# Patient Record
Sex: Female | Born: 1935 | Race: White | Hispanic: No | Smoking: Never smoker
Health system: Southern US, Community
[De-identification: ages and names within clinical notes are randomized; demographics above are authoritative.]

## PROBLEM LIST (undated history)

## (undated) DIAGNOSIS — I350 Nonrheumatic aortic (valve) stenosis: Secondary | ICD-10-CM

## (undated) DIAGNOSIS — N183 Chronic kidney disease, stage 3 unspecified: Secondary | ICD-10-CM

## (undated) DIAGNOSIS — E785 Hyperlipidemia, unspecified: Secondary | ICD-10-CM

## (undated) DIAGNOSIS — I10 Essential (primary) hypertension: Secondary | ICD-10-CM

## (undated) DIAGNOSIS — E039 Hypothyroidism, unspecified: Secondary | ICD-10-CM

## (undated) DIAGNOSIS — Z789 Other specified health status: Secondary | ICD-10-CM

## (undated) DIAGNOSIS — M1990 Unspecified osteoarthritis, unspecified site: Secondary | ICD-10-CM

## (undated) DIAGNOSIS — M810 Age-related osteoporosis without current pathological fracture: Secondary | ICD-10-CM

## (undated) DIAGNOSIS — C801 Malignant (primary) neoplasm, unspecified: Secondary | ICD-10-CM

## (undated) DIAGNOSIS — K219 Gastro-esophageal reflux disease without esophagitis: Secondary | ICD-10-CM

## (undated) DIAGNOSIS — R001 Bradycardia, unspecified: Secondary | ICD-10-CM

## (undated) DIAGNOSIS — D649 Anemia, unspecified: Secondary | ICD-10-CM

## (undated) DIAGNOSIS — I351 Nonrheumatic aortic (valve) insufficiency: Secondary | ICD-10-CM

## (undated) DIAGNOSIS — N189 Chronic kidney disease, unspecified: Secondary | ICD-10-CM

## (undated) DIAGNOSIS — D509 Iron deficiency anemia, unspecified: Secondary | ICD-10-CM

## (undated) DIAGNOSIS — I2510 Atherosclerotic heart disease of native coronary artery without angina pectoris: Secondary | ICD-10-CM

## (undated) HISTORY — PX: CARDIAC CATHETERIZATION: SHX172

## (undated) HISTORY — DX: Bradycardia, unspecified: R00.1

## (undated) HISTORY — PX: ABDOMINAL HYSTERECTOMY: SHX81

## (undated) HISTORY — DX: Nonrheumatic aortic (valve) insufficiency: I35.1

## (undated) HISTORY — DX: Other specified health status: Z78.9

## (undated) HISTORY — DX: Nonrheumatic aortic (valve) stenosis: I35.0

## (undated) HISTORY — DX: Gastro-esophageal reflux disease without esophagitis: K21.9

## (undated) HISTORY — DX: Iron deficiency anemia, unspecified: D50.9

## (undated) HISTORY — PX: OTHER SURGICAL HISTORY: SHX169

## (undated) HISTORY — DX: Chronic kidney disease, stage 3 unspecified: N18.30

## (undated) HISTORY — DX: Age-related osteoporosis without current pathological fracture: M81.0

## (undated) HISTORY — DX: Hyperlipidemia, unspecified: E78.5

## (undated) HISTORY — DX: Chronic kidney disease, stage 3 (moderate): N18.3

---

## 2002-01-17 HISTORY — PX: CORONARY ARTERY BYPASS GRAFT: SHX141

## 2002-09-16 ENCOUNTER — Inpatient Hospital Stay (HOSPITAL_COMMUNITY): Admission: RE | Admit: 2002-09-16 | Discharge: 2002-09-23 | Payer: Self-pay | Admitting: Cardiology

## 2002-09-17 ENCOUNTER — Encounter (INDEPENDENT_AMBULATORY_CARE_PROVIDER_SITE_OTHER): Payer: Self-pay | Admitting: Cardiology

## 2002-09-19 ENCOUNTER — Encounter: Payer: Self-pay | Admitting: Surgery

## 2002-09-20 ENCOUNTER — Encounter: Payer: Self-pay | Admitting: Surgery

## 2002-09-21 ENCOUNTER — Encounter: Payer: Self-pay | Admitting: Surgery

## 2002-11-04 ENCOUNTER — Encounter (HOSPITAL_COMMUNITY): Admission: RE | Admit: 2002-11-04 | Discharge: 2003-02-02 | Payer: Self-pay | Admitting: Cardiology

## 2003-02-03 ENCOUNTER — Encounter (HOSPITAL_COMMUNITY): Admission: RE | Admit: 2003-02-03 | Discharge: 2003-02-17 | Payer: Self-pay | Admitting: Cardiology

## 2005-04-14 ENCOUNTER — Other Ambulatory Visit: Admission: RE | Admit: 2005-04-14 | Discharge: 2005-04-14 | Payer: Self-pay | Admitting: Internal Medicine

## 2005-10-15 ENCOUNTER — Encounter: Admission: RE | Admit: 2005-10-15 | Discharge: 2005-10-15 | Payer: Self-pay | Admitting: Internal Medicine

## 2005-11-18 ENCOUNTER — Encounter: Admission: RE | Admit: 2005-11-18 | Discharge: 2005-12-13 | Payer: Self-pay | Admitting: Neurosurgery

## 2006-08-28 ENCOUNTER — Other Ambulatory Visit: Admission: RE | Admit: 2006-08-28 | Discharge: 2006-08-28 | Payer: Self-pay | Admitting: Geriatric Medicine

## 2010-06-04 NOTE — Discharge Summary (Signed)
[name], [name]                        ACCOUNT NO.:  [id number]   MEDICAL RECORD NO.:  [id number]                   PATIENT TYPE:  INP   LOCATION:  2012                                 FACILITY:  [facility]   PHYSICIAN:  Evelene Croon, M.D.                  DATE OF BIRTH:  05-Dec-1931   DATE OF ADMISSION:  09/16/2002  DATE OF DISCHARGE:  09/23/2002                                 DISCHARGE SUMMARY   ADMISSION DIAGNOSIS:  Abnormal Cardiolite stress test and chest pain.   PAST MEDICAL HISTORY:  1. Hypothyroidism.  2. Asthma.  3. Hypolipidemia.  4. Osteoarthritis.   ALLERGIES:  ANACIN, EXCEDRIN, and ALEVE.  They all cause hives.   DISCHARGE DIAGNOSIS:  Severe three-vessel coronary artery disease, status  post coronary artery bypass graft.   BRIEF HISTORY:  [name] is a 77 year old Caucasian female.  She  presented to her primary care [facility], Dr. Merril Abbe with a two-month  history of exertional burning sensation at the base of her neck.  He  performed a Cardiolite stress test and then referred her to Armanda Magic,  M.D., for interpretation of the exam.  She saw Dr. Mayford Knife on September 13, 2002.  Dr. Mayford Knife interpreted the Cardiolite exam as revealing anterior  ischemia with a fixed apical defect, question infarct versus breast  attenuation.  The LV function was normal.  After examination of [name]  and review of the Cardiolite, Dr. Mayford Knife recommended proceeding with cardiac  catheterization.  [name] agreed with this plan and she was scheduled  for September 16, 2002.   HOSPITAL COURSE:  On September 16, 2002, [name] was electively admitted  to [facility] under the care of Armanda Magic, M.D.  She underwent a  cardiac catheterization.  This revealed severe three-vessel coronary artery  disease.  The ejection fraction was estimated to be 55-65%.  As her lesions  were not amenable to PCI, a cardiac surgery consultation was requested.  She  was seen later  that day by Evelene Croon, M.D.  After examination of the  patient and review of the available records, including the catheterization  films, Dr. Laneta Simmers agreed that proceeding with coronary artery bypass surgery  was the preferred treatment choice of this woman.  The procedure, risks, and  benefits were all discussed with [name] and she agreed to proceed with  surgery.   Preoperative arterial evaluation included carotid Dopplers which revealed no  significant carotid artery disease.  Her upper extremity exam included an  Allen's test.  This was abnormal on the right and within normal limits on  the left.  Her lower extremity exam revealed palpable pedal pulses  bilaterally.   [name] remained stable in the hospital awaiting her surgery and on  September 19, 2002, she underwent the following surgical procedure by Evelene Croon, M.D.:  Coronary artery bypass grafting x 4.  Grafts  placed at the  time of the procedure:  Left internal mammary artery grafted to the left  anterior descending artery, saphenous vein graft grafted to the diagonal  artery, saphenous vein grafted to the obtuse marginal artery, saphenous vein  grafted to the posterior descending artery.  Vein was harvested from the  left thigh via the endovein harvesting technique.  She tolerated this  procedure well and was transferred in stable condition to the SICU.  She  remained hemodynamically stable and was extubated by the early morning hours  of postoperative day #1.  She awoke from anesthesia neurologically intact.  On postoperative day #1, her hemoglobin was 8.1.  She was transfused with  one unit of packed cells.  She required only routine care in the intensive  care unit and was transferred to unit 2000 on postoperative day #1.   [name] is making very good progress in recovery from her surgery.  This morning, September 22, 2002, is postoperative day #3.  She reports  feeling pretty well.  Her vital signs  are stable.  The blood pressure is  130/65.  She is afebrile.  Her room air saturation is 95%.  Her heart is in  normal sinus rhythm, rate in the 80s.  Her lungs are clear.  Her incisions  are healing well.  She does have mild lower extremity edema.  If she  continues to progress in this manner, it is expected that she will be ready  for discharge home in the next 24-48 hours.   LABORATORY STUDIES:  On September 21, 2002, CBC with white blood count 10.6,  hemoglobin 8.5, hematocrit 25.4, and platelets 124.  Chemistries include a  sodium of 137, potassium 3.8, chloride 107, CO2 27, BUN 17, creatinine 1.0,  and glucose 98.   CONDITION ON DISCHARGE:  Improved.   DISCHARGE MEDICATIONS:  1. Enteric-coated aspirin 325 mg daily.  2. Coreg 6.25 mg b.i.d.  3. Lasix 40 mg p.o. daily for five days.  4. Potassium chloride 20 mEq p.o. daily x 5 days.   She has also been instructed to resume her home medications of:  1. Synthroid 75 mcg daily.  2. Protonix 40 mg daily.  3. Lipitor 10 mg daily.  4. Periostat 20 mg daily.   For pain management, she may have Ultram 50 mg one to two p.o. q.4-6h.  p.r.n.   ACTIVITY:  She has been asked to refrain from any driving, heavy lifting,  pushing, or pulling.  She is also instructed to continue her breathing  exercises and daily walking.   DIET:  Her diet should continue to be a heart healthy diet.   WOUND CARE:  She is to shower daily with mild soap and water.  If her  incisions show any signs of infection or should she have a fever greater  than 101 degrees Fahrenheit, she is to call Dr. Sharee Pimple office.   FOLLOWUP:  1. Dr. Mayford Knife should see her in her office in approximately two weeks.  The     patient has been asked to call to arrange an appointment and has been     given the office phone number.  She will have a chest x-ray taken that     day.  2. Dr. Laneta Simmers would like to see her at the [facility] office in three weeks.  The    office will call with a  date and time for that appointment.  She will be     asked to bring  her chest x-ray for Dr. Sharee Pimple review.      Toribio Harbour, N.P.                  Evelene Croon, M.D.    CTK/MEDQ  D:  09/22/2002  T:  09/22/2002  Job:  [id number]   cc:   Armanda Magic, M.D.  [address]  [place], [place] [address]  Fax: [phone]   Ike Bene, M.D.  [address]  [place]  [place] [address]  Fax: [phone]

## 2010-06-04 NOTE — Op Note (Signed)
[name], [name]                        ACCOUNT NO.:  [id number]   MEDICAL RECORD NO.:  [id number]                   PATIENT TYPE:  INP   LOCATION:  2316                                 FACILITY:  [facility]   PHYSICIAN:  Evelene Croon, M.D.                  DATE OF BIRTH:  10-29-1931   DATE OF PROCEDURE:  09/19/2002  DATE OF DISCHARGE:                                 OPERATIVE REPORT   PREOPERATIVE DIAGNOSES:  Severe three-vessel coronary artery disease.   POSTOPERATIVE DIAGNOSES:  Severe three-vessel coronary artery disease.   OPERATION PERFORMED:  Median sternotomy, extracorporeal circulation,  coronary artery bypass grafting surgery times four using a left internal  mammary artery graft to the left anterior descending coronary artery, with  saphenous vein graft to the diagonal branch of the left anterior descending,  a saphenous vein graft to the obtuse marginal branch of the left circumflex  coronary artery, and saphenous vein graft to the posterior descending  coronary artery.  Endoscopic vein harvesting from the left leg.   SURGEON:  Alleen Borne, M.D.   ASSISTANT:  Kerin Perna, M.D.   SECOND ASSISTANT:  1. Rowe Clack, P.A.-C.  2. Pecola Leisure, PA   ANESTHESIA:  General endotracheal.   INDICATIONS FOR PROCEDURE:  The patient is a 77 year old woman with a  history of hypothyroidism and hyperlipidemia who presented with a two-month  history of substernal chest burning.  She had a positive Cardiolite stress  test showing anterior ischemia with a fixed apical defect.  Cardiac  catheterization was performed and this showed severe three vessel disease.  The LAD was occluded at its midportion with faint bridging collaterals  filling the distal vessel.  There was a single diagonal branch which  bifurcated quickly after its take off from the LAD.  The more superior  subbranch was a larger vessel and had 99% proximal stenosis.  The inferior  subbranch was  subtotally occluded but was very small.  The left circumflex  had 80% and 70% proximal and midstenoses and essentially gave off a single  large marginal branch.  The right coronary artery had sequential 90%  proximal and mid posterior descending stenoses.  Left ventricular function  was normal. There was no gradient across the aortic valve.  There was mild  mitral regurgitation on catheterization.  An echocardiogram was performed  and this showed normal left ventricular ejection fraction of 55 to 65% with  no wall motion abnormalities and trivial mitral regurgitation.  After review  of the angiogram and examination of the patient, it was felt that coronary  artery bypass graft surgery was the best treatment.  I discussed the  operative procedure with the patient and her family including alternatives,  benefits, and risks including bleeding, possible blood transfusion,  infection, stroke, myocardial infarction, graft failure, and death.  They  understood and agreed to proceed.   DESCRIPTION OF PROCEDURE:  The patient was taken to the operating room and  placed on the table in supine position.  After induction of general  endotracheal anesthesia, a Foley catheter was placed in the bladder using  sterile technique.  Then the chest, abdomen and both lower extremities were  prepped and draped in the usual sterile manner.  The chest was entered  through a median sternotomy incision and the pericardium opened in the  midline.  Examination of the heart showed good ventricular contractility.  The ascending aorta had no palpable plaques in it.   Then the left internal mammary artery was harvested from the chest wall as a  pedicle graft.  This was a medium caliber vessel with excellent blood flow  through it.  At the same time a segment of greater saphenous vein was  harvested from the left leg using endoscopic vein harvest technique.  This  vein was of medium size and good quality.  We initially  tried to find a vein  in the right leg adjacent to the right knee but could not find the saphenous  vein.  She had a large amount of fat in this area which made this difficult.   Then the patient was heparinized and when an adequate activated clotting  time was achieved, the distal ascending aorta was cannulated using a 20  French aortic cannula for arterial inflow.  Venous outflow was achieved  using a two-stage venous cannula for the right atrial appendage.  An  antegrade cardioplegia and vent cannula was inserted in the aortic root.   The patient was placed on cardiopulmonary bypass and the distal coronary  arteries identified.  The LAD was diffusely diseased.  It was graftable  distally.  The diagonal branch was small but graftable.  It was lying  beneath the epicardial fat.  The obtuse marginal was a large vessel but was  also diffusely diseased extending out into the distal portion of the vessel.  The right coronary artery gave off a long but small posterior descending  branch which was diffusely diseased but graftable distally.  There were a  couple of small posterolateral branches that were not graftable.  The main  body of the artery was completely involved with calcified plaque.   Then the aorta was crossclamped and of cold blood antegrade  cardioplegia was administered in the aortic root with quick arrest of the  heart.  Systemic hypothermia to 20 degrees centigrade and topical  hypothermia with iced saline was used.  A temperature probe was placed on  the septum and insulating pad in the pericardium.   The first distal anastomosis was performed to the obtuse marginal branch.  The internal diameter was 1.75 mm.  The conduit used was a segment of the  greater saphenous vein.  The anastomosis was performed in an end-to-side  manner using continuous 7-0 Prolene suture.  Flow was measured through the  graft and was excellent.  The second distal anastomosis was performed  to the diagonal branch.  The  internal diameter of this vessel was 1.5 mm.  The conduit used was a second  segment of the greater saphenous vein.  The anastomosis was performed in an  end-to-side manner using continuous 7-0 Prolene suture.  Flow was measured  through the graft and was good.  Then another dose of cardioplegia was given  down the vein grafts and in the aortic root.   Then the third distal anastomosis was performed to the posterior descending  coronary artery.  This was grafted quite distally due to the mid vessel  stenoses.  The internal diameter distally was about 1.5 mm.  The conduit  used was a third segment of the greater saphenous vein and the anastomosis  performed in an end-to-side manner using continuous 7-0 Prolene suture.  Flow was measured through the graft and was excellent.   The fourth distal anastomosis was performed to the distal portion of the  left anterior descending coronary artery.  The internal diameter was 1.75  mm.  The conduit used was the left internal mammary artery graft and this  was brought through the opening in the left pericardium anterior to the  phrenic nerve.  It was anastomosed to the LAD in end-to-side manner using  continuous 8-0 Prolene suture.  The pedicle was tacked to the epicardium  with 6-0 Prolene sutures.   The patient was rewarmed to 37 degrees centigrade and the clamp removed from  the mammary pedicle.  There was rapid warming of the ventricular septum and  return of spontaneous ventricular fibrillation.  The crossclamp was removed  with time of 60 minutes and the patient defibrillated into sinus rhythm.   A partial occlusion clamp was placed on the aortic root and three proximal  vein graft anastomoses were performed in end-to-side manner using continuous  6-0 Prolene suture.  The clamp was removed, the vein grafts deaired and the  clamps removed from them.  The proximal and distal anastomoses appeared  hemostatic and the  line of the graft satisfactory.  Graft markers were  placed around the proximal anastomoses.  Two temporary right ventricular and  right atrial pacing wires placed and brought out through the skin.   When the patient had rewarmed to 37 degrees centigrade, she was weaned from  cardiopulmonary bypass on no inotropic agents.  Total bypass time was 101  minutes.  Cardiac function appeared excellent with a cardiac output of 4L  per minute.  Protamine was given and the venous and aortic cannulas were  removed without difficulty.  Hemostasis was achieved.  Three chest tubes  were placed with a tube in the posterior pericardium, one in the left  pleural space and one in the anterior mediastinum.  The pericardium was  easily approximated over the heart.  The sternum was closed with #6  stainless steel wires.  The fascia was closed with continuous #1 Vicryl  suture.  The subcutaneous tissue was closed with continuous 2-0 Vicryl and the skin with 3-0 Vicryl subcuticular closure.  The lower extremity vein  harvest site was closed in layers in a similar manner.  Sponge, needle and  instrument counts were correct according to the scrub nurse.  Dry sterile  dressings were applied over the incisions, around the chest tubes which were  hooked to Pleur-evac suction.  The patient remained hemodynamically stable  and was transported to the SICU in guarded but stable condition.                                                Evelene Croon, M.D.    BB/MEDQ  D:  09/19/2002  T:  09/20/2002  Job:  [id number]   cc:   Armanda Magic, M.D.  [address]  [place], [place] [address]  Fax: [phone]   Cath lab

## 2010-06-04 NOTE — Consult Note (Signed)
[name], [name]                        ACCOUNT NO.:  [id number]   MEDICAL RECORD NO.:  [id number]                   PATIENT TYPE:  OIB   LOCATION:  2027                                 FACILITY:  [facility]   PHYSICIAN:  Evelene Croon, M.D.                  DATE OF BIRTH:  23-Mar-1931   DATE OF CONSULTATION:  09/16/2002  DATE OF DISCHARGE:                                   CONSULTATION   REASON FOR CONSULTATION:  Severe 3-vessel coronary artery disease.   HISTORY OF PRESENT ILLNESS:  This patient is a 77 year old white female who  is a patient of  Ike Bene, M.D.  She presented with a two month  history of exertional upper substernal burning usually occurring with  exertion such as walking up stairs or up an incline.  She has not had these  symptoms at rest.  She does have occasional burning in the same area after  eating which she has had for a long time and felt that this was reflux and  heartburn.  Recently, her usual antacid therapy of Pepcid has been  refractory and she was placed on Protonix 40 mg daily which seemed to help  some.  Recently, she has been having some episodes of indigestion that have  occurred, unrelated to eating.  She recently underwent a stress Cardiolite  exam on September 03, 2002, which showed anterior ischemia with a fixed apical  defect.  This is felt to be possible infarct versus breast attenuation.  She  did have burning sensation in the chest during exercise.  Left ventricular  function was normal.  She underwent cardiac catheterization today which  showed severe 3-vessel disease.  The LAD was occluded in it's midportion  with faint filling of the distal vessel by collaterals.  There was a  diagonal branch with 99% stenosis proximally an area of bifurcation.  One of  the subbranches had subtotal occlusion.  The left circumflex had a single  large marginal branch.  This had 80% and 70% sequential stenoses.  The right  coronary artery had  posterior descending branch and had two sequential 90%  stenoses in the proximal and midportions.  Left ventricular function was  normal.  There was no gradient across the aortic valve.  There is mild  mitral regurgitation.  End diastolic pressure was 34.   REVIEW OF SYSTEMS:  GENERAL:  Denies any fever or chills.  She has had no  recent weight changes.  She has had some fatigue.  Eyes:  Negative.  ENT:  Negative.  ENDOCRINE:  She has hypothyroidism.  She denies diabetes.  CARDIOVASCULAR:  As above.  She has had some mild shortness of breath with  exertion.  She denies PND and orthopnea.  She has had no peripheral edema.  She denies palpitations.  RESPIRATORY:  Denies cough and sputum production.  GI:  No nausea or vomiting.  Denies melena  and bright red blood per rectum.  GU:  Denies dysuria and hematuria.  Neurological:  Denies any focal weakness  or numbness.  She denies dizziness and syncope.  She has never had a TIA or  a stroke.  MUSCULOSKELETAL:  She denies myalgias.  She does have  osteoarthritis.  PSYCHIATRIC:  Negative.   ALLERGIES:  ALEVE, ANACIN, EXCEDRIN WHICH CAUSE HIVES.  She has no history  of shellfish or iodine allergy.  When she does have an allergic reaction,  she gets hives only on her face and scalp.   PRIOR TO ADMISSION MEDICATIONS:  1. Synthroid 0.075 mg daily.  2. Protonix 40 mg daily.  3. Lipitor 10 mg daily.  4. Periostat 20 mg daily.   PAST MEDICAL HISTORY:  Significant for hyperlipidemia.  She has history of  hypothyroidism.  She has history of asthma.  She has a history of  osteoarthritis.  She is status post hysterectomy.   FAMILY HISTORY:  Her father died of noncardiac causes.  Mother had  hypertension and heart disease and died in her 42's.  She has two sisters in  good health.  One brother had coronary bypass surgery when he was 72.   SOCIAL HISTORY:  She is married and lives with her husband who is in poor  health.  She has had three children.   She is retired from an Ambulance person position.  She does not smoke and does not drink alcohol.   PHYSICAL EXAMINATION:  VITAL SIGNS:  Blood pressure is 145/65, pulse 70 and  regular.  Respiratory rate is 16 and unlabored.  GENERAL:  A well-developed white female in no distress.  HEENT:  Normocephalic and atraumatic.  Pupils are equal and reactive to  light and accommodation.  Extraocular muscles are intact.  LUNGS:  Clear.  SKIN:  She has some hives in her scalp postcatheterization.  Skin is warm  and dry.  NECK:  Normal carotid pulses bilaterally.  There are no bruits.  There is no  adenopathy or thyromegaly.  CARDIAC:  Regular rate and rhythm with normal S1 and S2.  There is no  murmur, rub or gallop.  ABDOMEN:  Active bowel sounds.  Abdomen is soft, nontender.  There are no  palpable masses or organomegaly.  EXTREMITIES:  No peripheral edema.  Pulses are palpable bilaterally.  NEUROLOGIC:  Alert and oriented x3.  Motor and sensory exam is grossly  normal.   STUDIES:  Carotid Doppler examination shows no evidence of internal carotid  artery stenosis.   IMPRESSION:  Mrs. Tomasso has severe 3-vessel coronary artery disease with  angina and ischemia on stress Cardiolite examination.  I agree that coronary  artery bypass graft surgery is the best treatment to prevent further  ischemia and infarction.  I discussed the operative procedure with the  patient and her two sisters, including alternatives, benefits, and risks  including bleeding, blood transfusion, infection, stroke, myocardial  infarction and death.  They understand and agree to proceed with surgery.  Will plan to do this on Thursday.                                                Evelene Croon, M.D.    BB/MEDQ  D:  09/16/2002  T:  09/16/2002  Job:  [id number]   cc:   Armanda Magic, M.D.  [address]  [place], [place] [address] Fax: [phone]

## 2010-06-04 NOTE — Cardiovascular Report (Signed)
[name], [name]                        ACCOUNT NO.:  [id number]   MEDICAL RECORD NO.:  [id number]                   PATIENT TYPE:  OIB   LOCATION:  2899                                 FACILITY:  [facility]   PHYSICIAN:  Armanda Magic, M.D.                  DATE OF BIRTH:  [birth year]   DATE OF PROCEDURE:  09/16/2002  DATE OF DISCHARGE:                              CARDIAC CATHETERIZATION   REFERRING PHYSICIAN:  Ike Bene, M.D.   PROCEDURE:  1. Left heart catheterization.  2. Coronary angiography.  3. Left ventriculography.   OPERATOR:  Armanda Magic, M.D.   INDICATIONS:  Chest pain, abnormal Cardiolite.   COMPLICATIONS:  None.   DESCRIPTION OF PROCEDURE:  This is a 77 year old female with a history of  chest pain for about two months, also history of hyperlipidemia and  hypothyroidism who presents for catheterization due to abnormal stress  Cardiolite study.  The patient is brought to the cardiac catheterization  laboratory in the fasting nonsedated state.  Informed consent was obtained.  The patient was connected to continuous heart rate and pulse oximetry  monitoring, intermittent blood pressure monitoring.  The right groin was  prepped and draped in the sterile fashion.  1% Xylocaine was used for local  anesthesia.  Using the modified Seldinger technique a 6-French sheath was  placed in the right femoral artery.  Under fluoroscopic guidance a 6-French  JL4 catheter was placed in the left coronary artery.  Multiple  cineangiographic films were taken in 30 degree RAO, 40 degree LAO views.  This catheter was then exchanged out over a guidewire for a 6-French JR4  catheter which was placed under fluoroscopic guidance in the right coronary  artery.  Multiple cineangiographic films were taken in a 30 degree RAO, 40  degree LAO views.  This catheter was then removed over a guidewire and a 6-  French angled pigtail catheter was placed under fluoroscopic guidance into  the left ventricular cavity.  Left ventriculography was performed in the 30  degree RAO view with a total of 30 mL of contrast at 30 mL/second.  The  catheter was then pulled back across the aortic valve with no significant  gradient noted.  At the end of the procedure all catheters and sheaths were  removed.  Manual compression was performed until adequate hemostasis was  obtained.  The patient was transferred back to the room in stable condition.   RESULTS:  1. Left main coronary artery is widely patent and bifurcates into a left     anterior descending artery and left circumflex artery.  2. The left anterior descending artery has a 50% proximal stenosis and then     gives rise to a very large diagonal.  This diagonal branches proximally     into two large daughter branches.  The first branch has a 99% ostial     stenosis and the second branch  is subtotaled at the ostium.  The LAD then     traverses to the mid portion at which point it is occluded.  3. The left circumflex gives rise to a first obtuse marginal branch which is     a small caliber vessel.  The left circumflex then gives to a very large     second obtuse marginal branch.  The rest of the left circumflex is very     small caliber and traverses the AV groove.  The obtuse marginal 2 has a     proximal 80% stenosis and then a mid stenosis of 70% involving the     bifurcation of a branch vessel.  The larger of the two branches of this     second obtuse marginal is widely patent distally and branches again into     two daughter branches both of which are widely patent.  4. The right coronary artery is widely patent throughout its course and     bifurcates into a posterior descending artery and a posterolateral     artery.  The posterior descending artery has a proximal and mid 90%     stenoses sequentially.   LEFT VENTRICULOGRAM:  Left ventriculography performed in the 30 degree RAO  view using a total of 30 mL of contrast at 13  mL/second showed normal LV  systolic function.  There was mild to moderate mitral regurgitation,  although there was ventricular ectopy noted at the time and this may be PVC  induced.  Left ventricular pressure was 151/14 mmHg with an LVEDP of 34.  Aortic pressure 148/66 mmHg.   IMPRESSION:  1. Three vessel obstructive coronary disease.  2. Normal left ventricular function.  3. Gastroesophageal reflux.  4. Hyperlipidemia.   PLAN:  Admit to telemetry bed.  Nitroglycerin paste 1 inch q.6h.  Coreg  6.125 mg b.i.d.  Check fasting lipid panel.  CVTS consult regarding CABG.  2-  D echocardiogram to evaluate MR and aspirin daily.                                               Armanda Magic, M.D.    TT/MEDQ  D:  09/16/2002  T:  09/16/2002  Job:  [id number]   cc:   Ike Bene, M.D.  [address]  [place]  [place] [phone]  Fax: [phone]

## 2010-07-18 DIAGNOSIS — D509 Iron deficiency anemia, unspecified: Secondary | ICD-10-CM

## 2010-07-18 HISTORY — DX: Iron deficiency anemia, unspecified: D50.9

## 2010-09-18 HISTORY — PX: COLONOSCOPY: SHX174

## 2010-10-07 ENCOUNTER — Ambulatory Visit (HOSPITAL_COMMUNITY)
Admission: RE | Admit: 2010-10-07 | Discharge: 2010-10-07 | Disposition: A | Discharge status: Home / Self Care | Payer: Medicare Other | Source: Ambulatory Visit | Attending: Gastroenterology | Admitting: Gastroenterology

## 2010-10-07 DIAGNOSIS — E039 Hypothyroidism, unspecified: Secondary | ICD-10-CM | POA: Insufficient documentation

## 2010-10-07 DIAGNOSIS — I2510 Atherosclerotic heart disease of native coronary artery without angina pectoris: Secondary | ICD-10-CM | POA: Insufficient documentation

## 2010-10-07 DIAGNOSIS — D509 Iron deficiency anemia, unspecified: Secondary | ICD-10-CM | POA: Insufficient documentation

## 2010-10-07 DIAGNOSIS — E785 Hyperlipidemia, unspecified: Secondary | ICD-10-CM | POA: Insufficient documentation

## 2010-10-07 DIAGNOSIS — Z951 Presence of aortocoronary bypass graft: Secondary | ICD-10-CM | POA: Insufficient documentation

## 2010-10-07 DIAGNOSIS — I10 Essential (primary) hypertension: Secondary | ICD-10-CM | POA: Insufficient documentation

## 2010-10-07 DIAGNOSIS — K449 Diaphragmatic hernia without obstruction or gangrene: Secondary | ICD-10-CM | POA: Insufficient documentation

## 2010-10-07 DIAGNOSIS — K219 Gastro-esophageal reflux disease without esophagitis: Secondary | ICD-10-CM | POA: Insufficient documentation

## 2010-10-17 NOTE — Op Note (Signed)
  NAMEJAMERIAH, TROTTI NO.:  [id number]  MEDICAL RECORD NO.:  [id number]  LOCATION:  WLEN                         FACILITY:  [facility]  PHYSICIAN:  Danise Edge, M.D.   DATE OF BIRTH:  13-Dec-1931  DATE OF PROCEDURE:  10/07/2010 DATE OF DISCHARGE:                              OPERATIVE REPORT   DIAGNOSTIC ESOPHAGOGASTRODUODENOSCOPY AND COLONOSCOPY REPORT:  REFERRING PHYSICIAN:  Merlene Laughter, MD  HISTORY:  [name] is a 77 year old female born 1931/07/30. The patient has unexplained iron-deficiency associated with a serum iron saturation of 9% and a hemoglobin of 11.2 g.  She reports no gastrointestinal bleeding.  ENDOSCOPIST:  Danise Edge, MD  PREMEDICATION: 1. Fentanyl 25 mcg. 2. Versed 5 mg.  PROCEDURE:  Diagnostic esophagogastroduodenoscopy.  The patient was placed in the left lateral decubitus position.  The Pentax gastroscope was passed through the posterior hypopharynx into the proximal esophagus without difficulty.  The hypopharynx, larynx and vocal cords appeared normal.  Esophagoscopy:  The proximal, mid and lower segments of the esophageal mucosa appeared normal.  Gastroscopy:  There is a small hiatal hernia.  Retroflexed view of the gastric cardia and fundus was normal.  The gastric body, antrum and pylorus appeared normal.  Duodenoscopy:  The duodenal bulb and descending duodenum appeared normal.  ASSESSMENT:  Normal esophagogastroduodenoscopy.  PROCEDURE:  Diagnostic colonoscopy.  Anal inspection and digital rectal exam were normal.  The Pentax pediatric colonoscope was introduced into the rectum and easily advanced to the cecum.  A normal-appearing ileocecal valve and appendiceal orifice were identified.  Colonic preparation for the exam today was good.  Rectum normal.  Retroflexed view of the distal rectum normal. Sigmoid colon and descending colon normal. Splenic flexure normal. Transverse colon  normal. Hepatic flexure normal. Ascending colon normal. Cecum and ileocecal valve normal.  ASSESSMENT:  Normal diagnostic proctocolonoscopy to the cecum.          ______________________________ Danise Edge, M.D.     MJ/MEDQ  D:  10/07/2010  T:  10/07/2010  Job:  [id number]  cc:   Hal T. Stoneking, M.D. Fax: [phone]  Electronically Signed by Danise Edge M.D. on 10/17/2010 01:44:26 PM

## 2010-10-18 DIAGNOSIS — M810 Age-related osteoporosis without current pathological fracture: Secondary | ICD-10-CM

## 2010-10-18 HISTORY — DX: Age-related osteoporosis without current pathological fracture: M81.0

## 2011-08-16 ENCOUNTER — Other Ambulatory Visit: Payer: Self-pay | Admitting: Orthopaedic Surgery

## 2011-08-16 DIAGNOSIS — M545 Low back pain, unspecified: Secondary | ICD-10-CM

## 2011-08-23 ENCOUNTER — Ambulatory Visit
Admission: RE | Admit: 2011-08-23 | Discharge: 2011-08-23 | Disposition: A | Discharge status: Home / Self Care | Payer: Medicare Other | Source: Ambulatory Visit | Attending: Orthopaedic Surgery | Admitting: Orthopaedic Surgery

## 2011-08-23 DIAGNOSIS — M545 Low back pain, unspecified: Secondary | ICD-10-CM

## 2011-09-09 ENCOUNTER — Encounter (HOSPITAL_COMMUNITY): Payer: Self-pay | Admitting: Pharmacy Technician

## 2011-09-12 ENCOUNTER — Other Ambulatory Visit (HOSPITAL_COMMUNITY): Payer: Self-pay | Admitting: Orthopaedic Surgery

## 2011-09-13 ENCOUNTER — Encounter (HOSPITAL_COMMUNITY)
Admission: RE | Admit: 2011-09-13 | Discharge: 2011-09-13 | Disposition: A | Discharge status: Home / Self Care | Payer: Medicare Other | Source: Ambulatory Visit | Attending: Orthopaedic Surgery | Admitting: Orthopaedic Surgery

## 2011-09-13 ENCOUNTER — Encounter (HOSPITAL_COMMUNITY): Payer: Self-pay

## 2011-09-13 HISTORY — DX: Hypothyroidism, unspecified: E03.9

## 2011-09-13 HISTORY — DX: Essential (primary) hypertension: I10

## 2011-09-13 HISTORY — DX: Anemia, unspecified: D64.9

## 2011-09-13 HISTORY — DX: Chronic kidney disease, unspecified: N18.9

## 2011-09-13 HISTORY — DX: Malignant (primary) neoplasm, unspecified: C80.1

## 2011-09-13 HISTORY — DX: Unspecified osteoarthritis, unspecified site: M19.90

## 2011-09-13 HISTORY — DX: Atherosclerotic heart disease of native coronary artery without angina pectoris: I25.10

## 2011-09-13 LAB — CBC
HCT: 35.3 % — ABNORMAL LOW (ref 36.0–46.0)
Hemoglobin: 12.4 g/dL (ref 12.0–15.0)
MCH: 32.2 pg (ref 26.0–34.0)
MCHC: 35.1 g/dL (ref 30.0–36.0)
MCV: 91.7 fL (ref 78.0–100.0)
Platelets: 241 10*3/uL (ref 150–400)
RBC: 3.85 MIL/uL — ABNORMAL LOW (ref 3.87–5.11)
RDW: 12.6 % (ref 11.5–15.5)
WBC: 8.3 10*3/uL (ref 4.0–10.5)

## 2011-09-13 LAB — PROTIME-INR
INR: 0.96 (ref 0.00–1.49)
Prothrombin Time: 13 seconds (ref 11.6–15.2)

## 2011-09-13 LAB — COMPREHENSIVE METABOLIC PANEL
ALT: 12 U/L (ref 0–35)
AST: 22 U/L (ref 0–37)
Albumin: 4.3 g/dL (ref 3.5–5.2)
Alkaline Phosphatase: 47 U/L (ref 39–117)
BUN: 19 mg/dL (ref 6–23)
CO2: 28 mEq/L (ref 19–32)
Calcium: 10 mg/dL (ref 8.4–10.5)
Chloride: 97 mEq/L (ref 96–112)
Creatinine, Ser: 1.53 mg/dL — ABNORMAL HIGH (ref 0.50–1.10)
GFR calc Af Amer: 36 mL/min — ABNORMAL LOW (ref >90)
GFR calc non Af Amer: 31 mL/min — ABNORMAL LOW (ref >90)
Glucose, Bld: 82 mg/dL (ref 70–99)
Potassium: 3.3 mEq/L — ABNORMAL LOW (ref 3.5–5.1)
Sodium: 137 mEq/L (ref 135–145)
Total Bilirubin: 0.3 mg/dL (ref 0.3–1.2)
Total Protein: 8.1 g/dL (ref 6.0–8.3)

## 2011-09-13 LAB — SURGICAL PCR SCREEN
MRSA, PCR: NEGATIVE
Staphylococcus aureus: NEGATIVE

## 2011-09-13 LAB — APTT: aPTT: 29 seconds (ref 24–37)

## 2011-09-13 NOTE — Progress Notes (Signed)
Last OV , EKG, any available cardiac testing from [facility]. Dr. Armanda Magic

## 2011-09-13 NOTE — Pre-Procedure Instructions (Signed)
20 [name]  09/13/2011   Your procedure is scheduled on:  09-23-2011  Report to [facility] at 8:00AM.  Call this number if you have problems the morning of surgery: [phone]   Remember:   Do not eat food or drink:After Midnight.   .  Take these medicines the morning of surgery with A SIP OF WATER: carvedilol(Coreg)     Do not wear jewelry, make-up or nail polish.  Do not wear lotions, powders, or perfumes. You may wear deodorant.  Do not shave 48 hours prior to surgery..  Do not bring valuables to the hospital.  Contacts, dentures or bridgework may not be worn into surgery.  Leave suitcase in the car. After surgery it may be brought to your room.  For patients admitted to the hospital, checkout time is 11:00 AM the day of discharge.       Special Instructions: Incentive Spirometry - Practice and bring it with you on the day of surgery. and CHG Shower Use Special Wash: 1/2 bottle night before surgery and 1/2 bottle morning of surgery.      Please read over the following fact sheets that you were given: Pain Booklet, Coughing and Deep Breathing, MRSA Information and Surgical Site Infection Prevention

## 2011-09-14 ENCOUNTER — Encounter (HOSPITAL_COMMUNITY): Payer: Self-pay | Admitting: Vascular Surgery

## 2011-09-14 NOTE — Consult Note (Addendum)
Anesthesia chart review: Patient is a 78 year old female scheduled for L4-5 decompression on 09/23/2011 by Dr. Ophelia Charter.  History includes nonsmoker, coronary artery disease s/p CABG (LIMA to LAD, SVG to DIAG, SVG to OM, SVG to PDA) '04, mild AS by 02/2011 echo, hypothyroidism, hypertension, iron deficiency anemia (normal EGD/colonscopy 09/2010), chronic kidney disease stage III, arthritis, cervical cancer s/p hysterectomy. PCP is Dr. Pete Glatter.  Her Cardiologist is Dr. Mayford Knife [facility]).  Was last seen on 02/24/11 and by notes appeared to be doing well.  Six month follow-up was recommended.  Echo on 02/18/11 showed normal LV size and function, EF 61.3%, mild left atrial enlargement, mild MR, mild AR, mild AS, mild to moderate TR, mild pulmonic insufficiency, findings suggestive of grade 1 diastolic dysfunction without elevated LA pressure.  Her last cardiac cath was in August of 2004 prior to her CABG.   Her last stress test at [facility] is prior to her 2004 cath.  EKG on 09/13/11 showed NSR, possible LAE, non-specific anterior ST/T wave abnormality that appears new when compared to her EKGs from 07/13/07 (from [place]) and 09/20/02 in [place].   CXR on 09/13/11 showed no active cardiopulmonary process.  Labs noted.  K 3.3, Cr 1.53 (last Cr from [place] on 04/25/11 was 1.39), H/H 12.4/35.3.  Will repeat a BMET pre-operatively to ensure stability.  I reviewed cardiac history with Anesthesiologist Dr. Jean Rosenthal. Since it appears Ms. Conkright has not had a stress test since her CABG in 2004, we would like to at least ensure Dr. Mayford Knife is aware of her planned procedure so if she feels additional pre-operative testing is needed then it can be ordered.  Sherrie at Dr. Ophelia Charter office was notified.  Apparently, they are in the process of requesting clearance, but have yet to hear back from Dr. Mayford Knife.  Their office will follow-up.  Shonna Chock, PA-C 09/15/11 1116

## 2011-09-15 ENCOUNTER — Other Ambulatory Visit (HOSPITAL_COMMUNITY): Payer: Medicare Other

## 2011-09-20 NOTE — Progress Notes (Signed)
Spoke with Sheryl at Dr Ophelia Charter office.  She will check her faxes this morning and follow up on the cardiac clearance needed for this pt and fax to [place] if she has received it.

## 2011-09-22 MED ORDER — CEFAZOLIN SODIUM-DEXTROSE 2-3 GM-% IV SOLR
2.0000 g | INTRAVENOUS | Status: AC
  Administered 2011-09-23: 2 g via INTRAVENOUS
  Filled 2011-09-22: qty 50

## 2011-09-22 NOTE — Progress Notes (Signed)
Spoke with Sheryl at Dr. Ophelia Charter' office, clearance was rec'd & she will send to [place] via FAX now.

## 2011-09-23 ENCOUNTER — Encounter (HOSPITAL_COMMUNITY): Payer: Self-pay | Admitting: Vascular Surgery

## 2011-09-23 ENCOUNTER — Encounter (HOSPITAL_COMMUNITY): Payer: Self-pay | Admitting: *Deleted

## 2011-09-23 ENCOUNTER — Observation Stay (HOSPITAL_COMMUNITY)
Admission: RE | Admit: 2011-09-23 | Discharge: 2011-09-24 | Disposition: A | Discharge status: Home / Self Care | Payer: Medicare Other | Source: Ambulatory Visit | Attending: Orthopaedic Surgery | Admitting: Orthopaedic Surgery

## 2011-09-23 ENCOUNTER — Inpatient Hospital Stay (HOSPITAL_COMMUNITY): Payer: Medicare Other | Admitting: Vascular Surgery

## 2011-09-23 ENCOUNTER — Inpatient Hospital Stay (HOSPITAL_COMMUNITY): Payer: Medicare Other

## 2011-09-23 ENCOUNTER — Encounter (HOSPITAL_COMMUNITY)
Admission: RE | Disposition: A | Discharge status: Home / Self Care | Payer: Self-pay | Source: Ambulatory Visit | Attending: Orthopaedic Surgery

## 2011-09-23 DIAGNOSIS — Z951 Presence of aortocoronary bypass graft: Secondary | ICD-10-CM | POA: Insufficient documentation

## 2011-09-23 DIAGNOSIS — Z01812 Encounter for preprocedural laboratory examination: Secondary | ICD-10-CM | POA: Insufficient documentation

## 2011-09-23 DIAGNOSIS — E039 Hypothyroidism, unspecified: Secondary | ICD-10-CM | POA: Insufficient documentation

## 2011-09-23 DIAGNOSIS — I129 Hypertensive chronic kidney disease with stage 1 through stage 4 chronic kidney disease, or unspecified chronic kidney disease: Secondary | ICD-10-CM | POA: Insufficient documentation

## 2011-09-23 DIAGNOSIS — N183 Chronic kidney disease, stage 3 unspecified: Secondary | ICD-10-CM | POA: Insufficient documentation

## 2011-09-23 DIAGNOSIS — M1990 Unspecified osteoarthritis, unspecified site: Secondary | ICD-10-CM | POA: Insufficient documentation

## 2011-09-23 DIAGNOSIS — Z01818 Encounter for other preprocedural examination: Secondary | ICD-10-CM | POA: Insufficient documentation

## 2011-09-23 DIAGNOSIS — Z01810 Encounter for preprocedural cardiovascular examination: Secondary | ICD-10-CM | POA: Insufficient documentation

## 2011-09-23 DIAGNOSIS — M48062 Spinal stenosis, lumbar region with neurogenic claudication: Principal | ICD-10-CM | POA: Insufficient documentation

## 2011-09-23 DIAGNOSIS — I2510 Atherosclerotic heart disease of native coronary artery without angina pectoris: Secondary | ICD-10-CM | POA: Insufficient documentation

## 2011-09-23 DIAGNOSIS — M48061 Spinal stenosis, lumbar region without neurogenic claudication: Secondary | ICD-10-CM | POA: Diagnosis present

## 2011-09-23 HISTORY — PX: LUMBAR LAMINECTOMY: SHX95

## 2011-09-23 LAB — BASIC METABOLIC PANEL
BUN: 21 mg/dL (ref 6–23)
CO2: 28 mEq/L (ref 19–32)
Calcium: 10.2 mg/dL (ref 8.4–10.5)
Chloride: 102 mEq/L (ref 96–112)
Creatinine, Ser: 1.37 mg/dL — ABNORMAL HIGH (ref 0.50–1.10)
GFR calc Af Amer: 42 mL/min — ABNORMAL LOW (ref >90)
GFR calc non Af Amer: 36 mL/min — ABNORMAL LOW (ref >90)
Glucose, Bld: 115 mg/dL — ABNORMAL HIGH (ref 70–99)
Potassium: 3.3 mEq/L — ABNORMAL LOW (ref 3.5–5.1)
Sodium: 140 mEq/L (ref 135–145)

## 2011-09-23 SURGERY — MICRODISCECTOMY LUMBAR LAMINECTOMY
Anesthesia: General | Site: Back | Wound class: Clean

## 2011-09-23 MED ORDER — LEVOTHYROXINE SODIUM 75 MCG PO TABS
75.0000 ug | ORAL_TABLET | Freq: Every day | ORAL | Status: DC
  Administered 2011-09-23: 75 ug via ORAL
  Filled 2011-09-23 (×2): qty 1

## 2011-09-23 MED ORDER — ACETAMINOPHEN 325 MG PO TABS: 650.0000 mg | ORAL_TABLET | ORAL | Status: DC | PRN

## 2011-09-23 MED ORDER — ACETAMINOPHEN 10 MG/ML IV SOLN
INTRAVENOUS | Status: AC
  Filled 2011-09-23: qty 100

## 2011-09-23 MED ORDER — PROPOFOL 10 MG/ML IV EMUL
INTRAVENOUS | Status: DC | PRN
  Administered 2011-09-23: 150 mg via INTRAVENOUS

## 2011-09-23 MED ORDER — ONDANSETRON HCL 4 MG/2ML IJ SOLN: 4.0000 mg | INTRAMUSCULAR | Status: DC | PRN

## 2011-09-23 MED ORDER — ONDANSETRON HCL 4 MG/2ML IJ SOLN
INTRAMUSCULAR | Status: DC | PRN
  Administered 2011-09-23: 4 mg via INTRAVENOUS

## 2011-09-23 MED ORDER — HYDROCHLOROTHIAZIDE 12.5 MG PO CAPS
12.5000 mg | ORAL_CAPSULE | Freq: Every day | ORAL | Status: DC
  Administered 2011-09-23: 12.5 mg via ORAL
  Filled 2011-09-23 (×2): qty 1

## 2011-09-23 MED ORDER — VITAMIN D (ERGOCALCIFEROL) 1.25 MG (50000 UNIT) PO CAPS
50000.0000 [IU] | ORAL_CAPSULE | ORAL | Status: DC
  Filled 2011-09-23: qty 1

## 2011-09-23 MED ORDER — MORPHINE SULFATE 4 MG/ML IJ SOLN
INTRAMUSCULAR | Status: AC
Start: 2011-09-23 — End: 2011-09-23
  Administered 2011-09-23: 4 mg via INTRAVENOUS
  Filled 2011-09-23: qty 1

## 2011-09-23 MED ORDER — ROCURONIUM BROMIDE 100 MG/10ML IV SOLN
INTRAVENOUS | Status: DC | PRN
  Administered 2011-09-23: 50 mg via INTRAVENOUS

## 2011-09-23 MED ORDER — SODIUM CHLORIDE 0.9 % IJ SOLN: 3.0000 mL | Freq: Two times a day (BID) | INTRAMUSCULAR | Status: DC

## 2011-09-23 MED ORDER — METHOCARBAMOL 500 MG PO TABS: 500.0000 mg | ORAL_TABLET | Freq: Four times a day (QID) | ORAL | Status: DC | PRN

## 2011-09-23 MED ORDER — DOCUSATE SODIUM 100 MG PO CAPS
100.0000 mg | ORAL_CAPSULE | Freq: Two times a day (BID) | ORAL | Status: DC
  Administered 2011-09-23: 100 mg via ORAL
  Filled 2011-09-23 (×2): qty 1

## 2011-09-23 MED ORDER — GLYCOPYRROLATE 0.2 MG/ML IJ SOLN
INTRAMUSCULAR | Status: DC | PRN
  Administered 2011-09-23 (×2): 0.4 mg via INTRAVENOUS

## 2011-09-23 MED ORDER — OYSTER CALCIUM 500 MG PO TABS
500.0000 mg | ORAL_TABLET | Freq: Every day | ORAL | Status: DC
  Administered 2011-09-23: 500 mg via ORAL
  Filled 2011-09-23 (×2): qty 1

## 2011-09-23 MED ORDER — PHENOL 1.4 % MT LIQD: 1.0000 | OROMUCOSAL | Status: DC | PRN

## 2011-09-23 MED ORDER — MIDAZOLAM HCL 5 MG/5ML IJ SOLN
INTRAMUSCULAR | Status: DC | PRN
  Administered 2011-09-23: 2 mg via INTRAVENOUS

## 2011-09-23 MED ORDER — SODIUM CHLORIDE 0.9 % IJ SOLN: 3.0000 mL | INTRAMUSCULAR | Status: DC | PRN

## 2011-09-23 MED ORDER — AMLODIPINE BESYLATE 5 MG PO TABS
5.0000 mg | ORAL_TABLET | Freq: Every day | ORAL | Status: DC
  Administered 2011-09-23: 5 mg via ORAL
  Filled 2011-09-23 (×2): qty 1

## 2011-09-23 MED ORDER — MENTHOL 3 MG MT LOZG: 1.0000 | LOZENGE | OROMUCOSAL | Status: DC | PRN

## 2011-09-23 MED ORDER — LIDOCAINE HCL (CARDIAC) 20 MG/ML IV SOLN
INTRAVENOUS | Status: DC | PRN
  Administered 2011-09-23: 80 mg via INTRAVENOUS

## 2011-09-23 MED ORDER — BUPIVACAINE HCL (PF) 0.25 % IJ SOLN
INTRAMUSCULAR | Status: AC
  Filled 2011-09-23: qty 30

## 2011-09-23 MED ORDER — SODIUM CHLORIDE 0.9 % IV SOLN: 250.0000 mL | INTRAVENOUS | Status: DC

## 2011-09-23 MED ORDER — KCL IN DEXTROSE-NACL 20-5-0.45 MEQ/L-%-% IV SOLN
INTRAVENOUS | Status: AC
  Filled 2011-09-23: qty 1000

## 2011-09-23 MED ORDER — DEXAMETHASONE SODIUM PHOSPHATE 4 MG/ML IJ SOLN
INTRAMUSCULAR | Status: DC | PRN
  Administered 2011-09-23: 8 mg via INTRAVENOUS

## 2011-09-23 MED ORDER — ZOLPIDEM TARTRATE 5 MG PO TABS: 5.0000 mg | ORAL_TABLET | Freq: Every evening | ORAL | Status: DC | PRN

## 2011-09-23 MED ORDER — ACETAMINOPHEN 10 MG/ML IV SOLN
INTRAVENOUS | Status: DC | PRN
  Administered 2011-09-23: 1000 mg via INTRAVENOUS

## 2011-09-23 MED ORDER — NEOSTIGMINE METHYLSULFATE 1 MG/ML IJ SOLN
INTRAMUSCULAR | Status: DC | PRN
  Administered 2011-09-23: 3 mg via INTRAVENOUS
  Administered 2011-09-23: 2 mg via INTRAVENOUS

## 2011-09-23 MED ORDER — FENTANYL CITRATE 0.05 MG/ML IJ SOLN
INTRAMUSCULAR | Status: DC | PRN
  Administered 2011-09-23: 100 ug via INTRAVENOUS

## 2011-09-23 MED ORDER — HYDROCODONE-ACETAMINOPHEN 5-325 MG PO TABS: 1.0000 | ORAL_TABLET | ORAL | Status: DC | PRN

## 2011-09-23 MED ORDER — KCL IN DEXTROSE-NACL 20-5-0.45 MEQ/L-%-% IV SOLN
INTRAVENOUS | Status: DC
  Administered 2011-09-23: 16:00:00 via INTRAVENOUS
  Filled 2011-09-23 (×3): qty 1000

## 2011-09-23 MED ORDER — LACTATED RINGERS IV SOLN
INTRAVENOUS | Status: DC
  Administered 2011-09-23: 09:00:00 via INTRAVENOUS

## 2011-09-23 MED ORDER — SENNOSIDES-DOCUSATE SODIUM 8.6-50 MG PO TABS: 1.0000 | ORAL_TABLET | Freq: Every evening | ORAL | Status: DC | PRN

## 2011-09-23 MED ORDER — EPHEDRINE SULFATE 50 MG/ML IJ SOLN
INTRAMUSCULAR | Status: DC | PRN
  Administered 2011-09-23 (×2): 10 mg via INTRAVENOUS

## 2011-09-23 MED ORDER — CARVEDILOL 12.5 MG PO TABS
12.5000 mg | ORAL_TABLET | Freq: Two times a day (BID) | ORAL | Status: DC
  Administered 2011-09-24: 12.5 mg via ORAL
  Filled 2011-09-23 (×3): qty 1

## 2011-09-23 MED ORDER — MORPHINE SULFATE 2 MG/ML IJ SOLN: 1.0000 mg | INTRAMUSCULAR | Status: DC | PRN

## 2011-09-23 MED ORDER — ASPIRIN EC 81 MG PO TBEC
81.0000 mg | DELAYED_RELEASE_TABLET | Freq: Every day | ORAL | Status: DC
  Administered 2011-09-23: 81 mg via ORAL
  Filled 2011-09-23 (×2): qty 1

## 2011-09-23 MED ORDER — METHOCARBAMOL 100 MG/ML IJ SOLN
500.0000 mg | Freq: Four times a day (QID) | INTRAVENOUS | Status: DC | PRN
  Filled 2011-09-23: qty 5

## 2011-09-23 MED ORDER — BUPIVACAINE HCL (PF) 0.25 % IJ SOLN
INTRAMUSCULAR | Status: DC | PRN
  Administered 2011-09-23: 8 mL

## 2011-09-23 MED ORDER — ONDANSETRON HCL 4 MG/2ML IJ SOLN: 4.0000 mg | Freq: Four times a day (QID) | INTRAMUSCULAR | Status: DC | PRN

## 2011-09-23 MED ORDER — BISACODYL 5 MG PO TBEC: 5.0000 mg | DELAYED_RELEASE_TABLET | Freq: Every day | ORAL | Status: DC | PRN

## 2011-09-23 MED ORDER — FERROUS SULFATE 325 (65 FE) MG PO TABS
325.0000 mg | ORAL_TABLET | Freq: Every day | ORAL | Status: DC
  Administered 2011-09-23: 325 mg via ORAL
  Filled 2011-09-23 (×2): qty 1

## 2011-09-23 MED ORDER — DEXTROSE 5 % IV SOLN
INTRAVENOUS | Status: DC | PRN
  Administered 2011-09-23 (×2): via INTRAVENOUS

## 2011-09-23 MED ORDER — HYDROMORPHONE HCL PF 1 MG/ML IJ SOLN
INTRAMUSCULAR | Status: AC
Start: 2011-09-23 — End: 2011-09-23
  Administered 2011-09-23: 0.5 mg via INTRAVENOUS
  Filled 2011-09-23: qty 1

## 2011-09-23 MED ORDER — SUCCINYLCHOLINE CHLORIDE 20 MG/ML IJ SOLN
INTRAMUSCULAR | Status: DC | PRN
  Administered 2011-09-23: 20 mg via INTRAVENOUS

## 2011-09-23 MED ORDER — METHOCARBAMOL 500 MG PO TABS: 500.0000 mg | ORAL_TABLET | Freq: Four times a day (QID) | ORAL | Status: AC

## 2011-09-23 MED ORDER — 0.9 % SODIUM CHLORIDE (POUR BTL) OPTIME
TOPICAL | Status: DC | PRN
  Administered 2011-09-23: 1000 mL

## 2011-09-23 MED ORDER — ACETAMINOPHEN 650 MG RE SUPP: 650.0000 mg | RECTAL | Status: DC | PRN

## 2011-09-23 MED ORDER — OXYCODONE-ACETAMINOPHEN 5-325 MG PO TABS
1.0000 | ORAL_TABLET | ORAL | Status: DC | PRN
  Administered 2011-09-23: 1 via ORAL
  Filled 2011-09-23: qty 1

## 2011-09-23 MED ORDER — EZETIMIBE 10 MG PO TABS
10.0000 mg | ORAL_TABLET | Freq: Every day | ORAL | Status: DC
  Administered 2011-09-23: 10 mg via ORAL
  Filled 2011-09-23 (×2): qty 1

## 2011-09-23 MED ORDER — OXYCODONE-ACETAMINOPHEN 5-325 MG PO TABS: 1.0000 | ORAL_TABLET | ORAL | Status: AC | PRN

## 2011-09-23 MED ORDER — CEFAZOLIN SODIUM 1-5 GM-% IV SOLN
1.0000 g | Freq: Three times a day (TID) | INTRAVENOUS | Status: AC
  Administered 2011-09-23 – 2011-09-24 (×2): 1 g via INTRAVENOUS
  Filled 2011-09-23 (×2): qty 50

## 2011-09-23 MED ORDER — ADULT MULTIVITAMIN W/MINERALS CH
1.0000 | ORAL_TABLET | Freq: Every day | ORAL | Status: DC
  Administered 2011-09-23: 1 via ORAL
  Filled 2011-09-23 (×2): qty 1

## 2011-09-23 MED ORDER — HYDROMORPHONE HCL PF 1 MG/ML IJ SOLN
0.2500 mg | INTRAMUSCULAR | Status: DC | PRN
  Administered 2011-09-23 (×2): 0.5 mg via INTRAVENOUS

## 2011-09-23 MED ORDER — LACTATED RINGERS IV SOLN
INTRAVENOUS | Status: DC | PRN
  Administered 2011-09-23 (×2): via INTRAVENOUS

## 2011-09-23 SURGICAL SUPPLY — 48 items
APL SKNCLS STERI-STRIP NONHPOA (GAUZE/BANDAGES/DRESSINGS) ×1
BENZOIN TINCTURE PRP APPL 2/3 (GAUZE/BANDAGES/DRESSINGS) ×2 IMPLANT
BUR ROUND FLUTED 4 SOFT TCH (BURR) ×1 IMPLANT
CLOTH BEACON ORANGE TIMEOUT ST (SAFETY) ×2 IMPLANT
CLSR STERI-STRIP ANTIMIC 1/2X4 (GAUZE/BANDAGES/DRESSINGS) ×1 IMPLANT
CORDS BIPOLAR (ELECTRODE) ×2 IMPLANT
COVER SURGICAL LIGHT HANDLE (MISCELLANEOUS) ×2 IMPLANT
DRAPE MICROSCOPE LEICA (MISCELLANEOUS) ×2 IMPLANT
DRAPE PROXIMA HALF (DRAPES) ×4 IMPLANT
DRSG EMULSION OIL 3X3 NADH (GAUZE/BANDAGES/DRESSINGS) ×2 IMPLANT
DURAPREP 26ML APPLICATOR (WOUND CARE) ×2 IMPLANT
ELECT REM PT RETURN 9FT ADLT (ELECTROSURGICAL) ×2
ELECTRODE REM PT RTRN 9FT ADLT (ELECTROSURGICAL) ×1 IMPLANT
GLOVE BIOGEL PI IND STRL 7.5 (GLOVE) ×1 IMPLANT
GLOVE BIOGEL PI IND STRL 8 (GLOVE) ×1 IMPLANT
GLOVE BIOGEL PI INDICATOR 7.5 (GLOVE) ×1
GLOVE BIOGEL PI INDICATOR 8 (GLOVE) ×1
GLOVE ECLIPSE 7.0 STRL STRAW (GLOVE) ×2 IMPLANT
GLOVE ORTHO TXT STRL SZ7.5 (GLOVE) ×2 IMPLANT
GOWN PREVENTION PLUS LG XLONG (DISPOSABLE) IMPLANT
GOWN STRL NON-REIN LRG LVL3 (GOWN DISPOSABLE) ×6 IMPLANT
KIT BASIN OR (CUSTOM PROCEDURE TRAY) ×2 IMPLANT
KIT ROOM TURNOVER OR (KITS) ×2 IMPLANT
MANIFOLD NEPTUNE II (INSTRUMENTS) ×2 IMPLANT
NDL HYPO 25GX1X1/2 BEV (NEEDLE) ×1 IMPLANT
NDL SPNL 18GX3.5 QUINCKE PK (NEEDLE) ×1 IMPLANT
NDL SUT .5 MAYO 1.404X.05X (NEEDLE) ×1 IMPLANT
NEEDLE HYPO 25GX1X1/2 BEV (NEEDLE) ×2 IMPLANT
NEEDLE MAYO TAPER (NEEDLE) ×2
NEEDLE SPNL 18GX3.5 QUINCKE PK (NEEDLE) ×2 IMPLANT
NS IRRIG 1000ML POUR BTL (IV SOLUTION) ×2 IMPLANT
PACK LAMINECTOMY ORTHO (CUSTOM PROCEDURE TRAY) ×2 IMPLANT
PAD ARMBOARD 7.5X6 YLW CONV (MISCELLANEOUS) ×4 IMPLANT
PATTIES SURGICAL .5 X.5 (GAUZE/BANDAGES/DRESSINGS) ×2 IMPLANT
PATTIES SURGICAL .75X.75 (GAUZE/BANDAGES/DRESSINGS) ×1 IMPLANT
SPONGE GAUZE 4X4 12PLY (GAUZE/BANDAGES/DRESSINGS) ×2 IMPLANT
STRIP CLOSURE SKIN 1/2X4 (GAUZE/BANDAGES/DRESSINGS) ×1 IMPLANT
SUT VIC AB 2-0 CT1 27 (SUTURE) ×2
SUT VIC AB 2-0 CT1 TAPERPNT 27 (SUTURE) ×1 IMPLANT
SUT VICRYL 0 TIES 12 18 (SUTURE) ×2 IMPLANT
SUT VICRYL 4-0 PS2 18IN ABS (SUTURE) ×1 IMPLANT
SUT VICRYL AB 2 0 TIES (SUTURE) ×2 IMPLANT
SYR 20ML ECCENTRIC (SYRINGE) ×1 IMPLANT
SYR CONTROL 10ML LL (SYRINGE) ×2 IMPLANT
TAPE CLOTH SURG 4X10 WHT LF (GAUZE/BANDAGES/DRESSINGS) ×1 IMPLANT
TOWEL OR 17X24 6PK STRL BLUE (TOWEL DISPOSABLE) ×2 IMPLANT
TOWEL OR 17X26 10 PK STRL BLUE (TOWEL DISPOSABLE) ×2 IMPLANT
WATER STERILE IRR 1000ML POUR (IV SOLUTION) ×2 IMPLANT

## 2011-09-23 NOTE — Preoperative (Addendum)
Beta Blockers   Pt took Coreg 12.5 mg this am @ 06:30

## 2011-09-23 NOTE — Interval H&P Note (Signed)
History and Physical Interval Note:  09/23/2011 10:18 AM  [name]  has presented today for surgery, with the diagnosis of L4-5 Stenosis  The various methods of treatment have been discussed with the patient and family. After consideration of risks, benefits and other options for treatment, the patient has consented to  Procedure(s) (LRB) with comments: MICRODISCECTOMY LUMBAR LAMINECTOMY (N/A) - L4-5 Decompression as a surgical intervention .  The patient's history has been reviewed, patient examined, no change in status, stable for surgery.  I have reviewed the patient's chart and labs.  Questions were answered to the patient's satisfaction.     Shawniece Oyola C

## 2011-09-23 NOTE — Anesthesia Procedure Notes (Signed)
Procedure Name: Intubation Date/Time: 09/23/2011 10:33 AM Performed by: Tyrone Nine Pre-anesthesia Checklist: Patient identified, Timeout performed, Emergency Drugs available, Suction available and Patient being monitored Patient Re-evaluated:Patient Re-evaluated prior to inductionOxygen Delivery Method: Circle system utilized Preoxygenation: Pre-oxygenation with 100% oxygen Intubation Type: IV induction Ventilation: Mask ventilation without difficulty Laryngoscope Size: Mac and 3 Tube type: Oral Tube size: 7.0 mm Number of attempts: 1 Airway Equipment and Method: Stylet Placement Confirmation: ETT inserted through vocal cords under direct vision,  positive ETCO2,  CO2 detector and breath sounds checked- equal and bilateral Secured at: 20 cm Tube secured with: Tape Dental Injury: Teeth and Oropharynx as per pre-operative assessment

## 2011-09-23 NOTE — Anesthesia Postprocedure Evaluation (Signed)
  Anesthesia Post-op Note  Patient: [name]  Procedure(s) Performed: Procedure(s) (LRB) with comments: MICRODISCECTOMY LUMBAR LAMINECTOMY (N/A) - L4-5 Decompression  Patient Location: PACU  Anesthesia Type: General  Level of Consciousness: awake and alert   Airway and Oxygen Therapy: Patient Spontanous Breathing and Patient connected to nasal cannula oxygen  Post-op Pain: mild  Post-op Assessment: Post-op Vital signs reviewed, Patient's Cardiovascular Status Stable, Respiratory Function Stable, Patent Airway and No signs of Nausea or vomiting  Post-op Vital Signs: Reviewed and stable  Complications: No apparent anesthesia complications

## 2011-09-23 NOTE — Progress Notes (Signed)
Lunch relief by MA Israa Caban RN 

## 2011-09-23 NOTE — Brief Op Note (Signed)
09/23/2011  6:39 PM  PATIENT:  [name]  78 y.o. female  PRE-OPERATIVE DIAGNOSIS:  L4-5 Stenosis  POST-OPERATIVE DIAGNOSIS:  L4-5 Stenosis  PROCEDURE:  Procedure(s) (LRB) with comments: MICRODISCECTOMY LUMBAR LAMINECTOMY (N/A) - L4-5 Decompression  SURGEON:  Surgeon(s) and Role:    * Eldred Manges, MD - Primary  PHYSICIAN ASSISTANT:   ASSISTANTS: sheila vernon PA-C  ANESTHESIA:   general  EBL:  Total I/O In: 1930 [P.O.:340; I.V.:1590] Out: 700 [Urine:650; Blood:50]  BLOOD ADMINISTERED:none  DRAINS: none   LOCAL MEDICATIONS USED:  NONE  SPECIMEN:  No Specimen  DISPOSITION OF SPECIMEN:  N/A  COUNTS:  YES  TOURNIQUET:  * No tourniquets in log *  DICTATION: .Other Dictation: Dictation Number [id number]  PLAN OF CARE: Admit to inpatient   PATIENT DISPOSITION:  PACU - hemodynamically stable.   Delay start of Pharmacological VTE agent (>24hrs) due to surgical blood loss or risk of bleeding: yes

## 2011-09-23 NOTE — H&P (Signed)
[name] is an 78 y.o. female.   Chief Complaint: low back pain and bilateral leg weakness with walking and standing HPI: Several years of increasing leg pain with ambulation.  Notes weakness in her thighs and numbness and tingling in both legs which is relieved with sitting.  Previous MRI on 2007 with moderate spinal stenosis at L4-5 and more recent MRI 08/2011 shows progression of the stenosis to severe stenosis with bilateral lateral recess stenosis which is severe.  Previous ESIs were helpful but now provide no relief.    Past Medical History  Diagnosis Date  . Coronary artery disease     cabag 2003  . Hypertension     dr. French Ana turner  cardiologist  . Hypothyroidism   . Cancer     ceervical  . Arthritis   . Anemia   . Chronic kidney disease     renal insufficency, stage III  . Aortic stenosis     mild AS by 02/18/11  [facility])    Past Surgical History  Procedure Date  . Coronary artery bypass graft     2004  . Abdominal hysterectomy   . Cardiac catheterization     2004  . Cataracts     History reviewed. No pertinent family history. Social History:  reports that she has never smoked. She does not have any smokeless tobacco history on file. She reports that she drinks alcohol. She reports that she does not use illicit drugs.  Allergies:  Allergies  Allergen Reactions  . Aleve (Naproxen Sodium) Hives  . Statins Other (See Comments)    No energy, very weak    Medications Prior to Admission  Medication Sig Dispense Refill  . alendronate (FOSAMAX) 70 MG tablet Take 70 mg by mouth every 7 (seven) days. Take on Mondays. Take with a full glass of water on an empty stomach.      [name] amLODipine (NORVASC) 5 MG tablet Take 5 mg by mouth at bedtime.      [name] aspirin EC 81 MG tablet Take 81 mg by mouth at bedtime.      . carvedilol (COREG) 12.5 MG tablet Take 12.5 mg by mouth 2 (two) times daily with a meal.      . ezetimibe (ZETIA) 10 MG tablet Take 10 mg by mouth at bedtime.       . ferrous sulfate 325 (65 FE) MG tablet Take 325 mg by mouth at bedtime.      . hydrochlorothiazide (MICROZIDE) 12.5 MG capsule Take 12.5 mg by mouth at bedtime.      [name] ibuprofen (ADVIL,MOTRIN) 200 MG tablet Take 200-400 mg by mouth every 6 (six) hours as needed. For pain      . levothyroxine (SYNTHROID, LEVOTHROID) 75 MCG tablet Take 75 mcg by mouth at bedtime.      . Multiple Vitamin (MULTIVITAMIN WITH MINERALS) TABS Take 1 tablet by mouth at bedtime.      Ethelda Chick (OYSTER CALCIUM) 500 MG TABS Take 500 mg of elemental calcium by mouth at bedtime.      . Vitamin D, Ergocalciferol, (DRISDOL) 50000 UNITS CAPS Take 50,000 Units by mouth every 7 (seven) days. Mondays        Results for orders placed during the hospital encounter of 09/23/11 (from the past 48 hour(s))  BASIC METABOLIC PANEL     Status: Abnormal   Collection Time   09/23/11  7:56 AM      Component Value Range Comment   Sodium 140  135 - 145 mEq/L    Potassium 3.3 (*) 3.5 - 5.1 mEq/L    Chloride 102  96 - 112 mEq/L    CO2 28  19 - 32 mEq/L    Glucose, Bld 115 (*) 70 - 99 mg/dL    BUN 21  6 - 23 mg/dL    Creatinine, Ser 8.52 (*) 0.50 - 1.10 mg/dL    Calcium 77.8  8.4 - 10.5 mg/dL    GFR calc non Af Amer 36 (*) >90 mL/min    GFR calc Af Amer 42 (*) >90 mL/min    No results found.  Review of Systems  Constitutional: Negative.   HENT: Negative.   Eyes: Negative.   Respiratory: Negative.   Cardiovascular: Negative.   Gastrointestinal: Negative.   Genitourinary: Negative.   Musculoskeletal: Positive for back pain.  Skin: Negative.   Neurological: Positive for tingling.       Bilateral legs  Endo/Heme/Allergies: Negative.   Psychiatric/Behavioral: Negative.     Blood pressure 97/61, pulse 55, temperature 97.7 F (36.5 C), temperature source Oral, resp. rate 18, SpO2 97.00%. Physical Exam  Constitutional: She is oriented to person, place, and time. She appears well-developed and well-nourished.  HENT:  Head:  Normocephalic and atraumatic.  Eyes: EOM are normal. Pupils are equal, round, and reactive to light.  Neck: Normal range of motion. Neck supple.  Cardiovascular: Normal rate, regular rhythm, normal heart sounds and intact distal pulses.   Respiratory: Effort normal and breath sounds normal.  GI: Soft. Bowel sounds are normal.  Musculoskeletal:       Negative SLR bilateral.  Mild sciatic notch tenderness. No focal weakness LEs  Neurological: She is alert and oriented to person, place, and time.  Skin: Skin is warm and dry.  Psychiatric: She has a normal mood and affect.     Assessment/Plan Spinal stenosis L4-5  PLAN: central decompressive laminectomy L4-5  Jeniel Slauson M 09/23/2011, 10:04 AM

## 2011-09-23 NOTE — Anesthesia Preprocedure Evaluation (Addendum)
Anesthesia Evaluation  Patient identified by MRN, date of birth, ID band Patient awake    Reviewed: Allergy & Precautions, H&P , NPO status , Patient's Chart, lab work & pertinent test results  Airway Mallampati: II  Neck ROM: full    Dental  (+) Teeth Intact and Missing,    Pulmonary neg pulmonary ROS,    Pulmonary exam normal       Cardiovascular hypertension, Pt. on home beta blockers + CAD and + CABG + Valvular Problems/Murmurs AS Rhythm:Regular Rate:Normal  Mild AS by echo 02/2011   Neuro/Psych Anxiety    GI/Hepatic negative GI ROS,   Endo/Other  Hypothyroidism   Renal/GU Renal InsufficiencyRenal disease     Musculoskeletal  (+) Arthritis -, Osteoarthritis,    Abdominal   Peds  Hematology negative hematology ROS (+)   Anesthesia Other Findings   Reproductive/Obstetrics negative OB ROS                         Anesthesia Physical Anesthesia Plan  ASA: III  Anesthesia Plan: General   Post-op Pain Management:    Induction: Intravenous  Airway Management Planned: Oral ETT  Additional Equipment:   Intra-op Plan:   Post-operative Plan: Extubation in OR  Informed Consent: I have reviewed the patients History and Physical, chart, labs and discussed the procedure including the risks, benefits and alternatives for the proposed anesthesia with the patient or authorized representative who has indicated his/her understanding and acceptance.   Dental advisory given  Plan Discussed with: CRNA and Surgeon  Anesthesia Plan Comments:        Anesthesia Quick Evaluation

## 2011-09-23 NOTE — Brief Op Note (Cosign Needed)
09/23/2011  12:00 PM  PATIENT:  Terance Ice  78 y.o. female  PRE-OPERATIVE DIAGNOSIS:  L4-5 Stenosis  POST-OPERATIVE DIAGNOSIS:  L4-5 Stenosis  PROCEDURE:  Procedure(s) (LRB) with comments: MICRODISCECTOMY LUMBAR LAMINECTOMY (N/A) - L4-5 Decompression  SURGEON:  Surgeon(s) and Role:    * Eldred Manges, MD - Primary  PHYSICIAN ASSISTANT: Maud Deed PAC   ASSISTANTS: none   ANESTHESIA:   general  EBL:  Total I/O In: 1550 [I.V.:1550] Out: 50 [Blood:50]  BLOOD ADMINISTERED:none  DRAINS: none   LOCAL MEDICATIONS USED:  MARCAINE     SPECIMEN:  No Specimen  DISPOSITION OF SPECIMEN:  N/A  COUNTS:  YES  TOURNIQUET:  * No tourniquets in log *  DICTATION: .Note written in EPIC  PLAN OF CARE: Admit for overnight observation  PATIENT DISPOSITION:  PACU - hemodynamically stable.   Delay start of Pharmacological VTE agent (>24hrs) due to surgical blood loss or risk of bleeding: yes

## 2011-09-23 NOTE — Transfer of Care (Signed)
Immediate Anesthesia Transfer of Care Note  Patient: [name]  Procedure(s) Performed: Procedure(s) (LRB) with comments: MICRODISCECTOMY LUMBAR LAMINECTOMY (N/A) - L4-5 Decompression  Patient Location: PACU  Anesthesia Type: General  Level of Consciousness: awake, alert , oriented and patient cooperative  Airway & Oxygen Therapy: Patient Spontanous Breathing and Patient connected to nasal cannula oxygen  Post-op Assessment: Report given to PACU RN and Post -op Vital signs reviewed and stable  Post vital signs: Reviewed and stable  Complications: No apparent anesthesia complications

## 2011-09-24 NOTE — Op Note (Signed)
[name], SLIGHT              ACCOUNT NO.:  [id number]  MEDICAL RECORD NO.:  [id number]  LOCATION:  5N23C                        FACILITY:  [facility]  PHYSICIAN:  Eluterio Seymour C. Ophelia Charter, M.D.    DATE OF BIRTH:  1931/07/01  DATE OF PROCEDURE:  09/23/2011 DATE OF DISCHARGE:                              OPERATIVE REPORT   PREOPERATIVE DIAGNOSIS:  L4-5 stenosis with neurogenic claudication.  POSTOPERATIVE DIAGNOSIS:  L4-5 stenosis with neurogenic claudication.  PROCEDURE:  L4-5 decompression.  SURGEON:  Lashanta Elbe C. Ophelia Charter, M.D.  ASSISTANT:  Wende Neighbors, PA-C, medically necessary and present for the entire procedure.  EBL:  Minimal.  ANESTHESIA:  GOT plus Marcaine skin local.  DRAINS:  None.  INDICATIONS:  This patient has had progressive neurogenic claudication with severe stenosis at the L4-5 disk level.  Immediately at the level of the disk, she has some lateral listhesis from scoliosis with endplate sclerosis hence the bulging of the disk more prominent on the right than left.  Multifactorial stenosis with primarily ligamentum hypertrophy causing the compression.  Options were discussed with the patient about decompression and fusion versus decompression alone and plan was for simple decompression. Ancef given pre-op.  After prepping and draping with DuraPrep, the patient was in a prone position on chest rolls, Yellow pads underneath the shoulders, underneath the forearms padding ulnar nerve.  Back was prepped with DuraPrep.  The area was squared with towels, sterile skin marker, Betadine, Steri-Drape, and laminectomy sheets and drapes were applied. Time-out procedure was completed.  Small incision was made even with the top of the iliac crest at the midline at 4-5 level easy to identify this based on the thin patient body habitus.  X-ray arrived and x-ray was taken with 2 Kochers that were expected to be over the disk space.  Top Kocher was at the inferior aspect of the disk  space.  When Kocher was removed, the spinous process where the Zannie Cove was placed with the top Kocher was marked and then partial spinous process resection was performed down to the lamina thinning the lamina down with a bur. Second x-ray was taken with a Penfield #4 just on the inferior aspect of the bulging disk confirmed with a second x-ray.  Hypertrophic thick chunks of ligament were removed, little bit worse on the right than left side.  The disk was bulging more on the right than left.  Due to the lateral listhesis, no microdiskectomy was performed.  Chunks of ligament were removed.  Foraminotomy was performed slightly on the right side where there was lateral recess stenosis of the nerve root at the inferior aspect of the pedicle from facet overgrowth.  These spurs were trimmed back giving the nerve more room.  No severe narrowing on the left side.  Bone and cement removed on to the level of the pedicle. Above and below there was sparsity of ligament and some epidural fat was noted cephalad. Dura was intact.  After irrigation with saline solution, fascia was closed with 0-Vicryl, #1 Vicryl, 2-0 Vicryl, subcutaneous tissue, subcuticular skin closure, postop dressing.  Instrument count and needle count was correct.  The patient tolerated the procedure well and was transferred to recovery room  in stable condition.     [name] C. Ophelia Charter, M.D.     MCY/MEDQ  D:  09/23/2011  T:  09/24/2011  Job:  [id number]

## 2011-09-26 ENCOUNTER — Encounter (HOSPITAL_COMMUNITY): Payer: Self-pay | Admitting: Orthopaedic Surgery

## 2011-09-30 NOTE — Discharge Summary (Signed)
Physician Discharge Summary  Patient ID: [name] MRN: [id number] DOB/AGE: 1932/01/01 78 y.o.  Admit date: 09/23/2011 Discharge date: 09/30/2011  Admission Diagnoses:  Spinal stenosis, lumbar L4-5  Discharge Diagnoses:  Principal Problem:  *Spinal stenosis, lumbar L4-5  Past Medical History  Diagnosis Date  . Coronary artery disease     cabag 2003  . Hypertension     dr. French Ana turner  cardiologist  . Hypothyroidism   . Cancer     ceervical  . Arthritis   . Anemia   . Chronic kidney disease     renal insufficency, stage III  . Aortic stenosis     mild AS by 02/18/11  Deboraha Sprang)    Surgeries: Procedure(s): MICRODISCECTOMY LUMBAR LAMINECTOMY on 09/23/2011 central L4-5   Consultants (if any):  none  Discharged Condition: Improved  Hospital Course: FRANCINE HANNAN is an 78 y.o. female who was admitted 09/23/2011 with a diagnosis of Spinal stenosis, lumbar and went to the operating room on 09/23/2011 and underwent the above named procedures.    She was given perioperative antibiotics:  Anti-infectives     Start     Dose/Rate Route Frequency Ordered Stop   09/23/11 1715   ceFAZolin (ANCEF) IVPB 1 g/50 mL premix        1 g 100 mL/hr over 30 Minutes Intravenous Every 8 hours 09/23/11 1712 09/24/11 0145   09/22/11 1415   ceFAZolin (ANCEF) IVPB 2 g/50 mL premix        2 g 100 mL/hr over 30 Minutes Intravenous 60 min pre-op 09/22/11 1415 09/23/11 1028        .  She was given sequential compression devices, early ambulation for DVT prophylaxis.  She benefited maximally from the hospital stay and there were no complications.    Recent vital signs:  Filed Vitals:   09/24/11 0637  BP: 104/41  Pulse: 63  Temp: 97.8 F (36.6 C)  Resp: 18    Recent laboratory studies:  Lab Results  Component Value Date   HGB 12.4 09/13/2011   Lab Results  Component Value Date   WBC 8.3 09/13/2011   PLT 241 09/13/2011   Lab Results  Component Value Date   INR 0.96 09/13/2011    Lab Results  Component Value Date   NA 140 09/23/2011   K 3.3* 09/23/2011   CL 102 09/23/2011   CO2 28 09/23/2011   BUN 21 09/23/2011   CREATININE 1.37* 09/23/2011   GLUCOSE 115* 09/23/2011    Discharge Medications:     Medication List     As of 09/30/2011 10:01 AM    TAKE these medications         alendronate 70 MG tablet   Commonly known as: FOSAMAX   Take 70 mg by mouth every 7 (seven) days. Take on Mondays. Take with a full glass of water on an empty stomach.      amLODipine 5 MG tablet   Commonly known as: NORVASC   Take 5 mg by mouth at bedtime.      aspirin EC 81 MG tablet   Take 81 mg by mouth at bedtime.      carvedilol 12.5 MG tablet   Commonly known as: COREG   Take 12.5 mg by mouth 2 (two) times daily with a meal.      ezetimibe 10 MG tablet   Commonly known as: ZETIA   Take 10 mg by mouth at bedtime.      ferrous sulfate 325 (65 FE)  MG tablet   Take 325 mg by mouth at bedtime.      hydrochlorothiazide 12.5 MG capsule   Commonly known as: MICROZIDE   Take 12.5 mg by mouth at bedtime.      ibuprofen 200 MG tablet   Commonly known as: ADVIL,MOTRIN   Take 200-400 mg by mouth every 6 (six) hours as needed. For pain      levothyroxine 75 MCG tablet   Commonly known as: SYNTHROID, LEVOTHROID   Take 75 mcg by mouth at bedtime.      methocarbamol 500 MG tablet   Commonly known as: ROBAXIN   Take 1 tablet (500 mg total) by mouth 4 (four) times daily.      multivitamin with minerals Tabs   Take 1 tablet by mouth at bedtime.      oxyCODONE-acetaminophen 5-325 MG per tablet   Commonly known as: PERCOCET/ROXICET   Take 1 tablet by mouth every 4 (four) hours as needed for pain.      oyster calcium 500 MG Tabs   Take 500 mg of elemental calcium by mouth at bedtime.      Vitamin D (Ergocalciferol) 50000 UNITS Caps   Commonly known as: DRISDOL   Take 50,000 Units by mouth every 7 (seven) days. Mondays        Diagnostic Studies: Dg Chest 2 View  09/13/2011   *RADIOLOGY REPORT*  Clinical Data: Preoperative respiratory examination for lumbar discectomy.  History of hypertension and CABG.  CHEST - 2 VIEW  Comparison: None.  Findings: The heart size and mediastinal contours are normal status post CABG.  The lungs are clear.  There is no pleural effusion or pneumothorax.  No acute osseous findings are demonstrated.  There are degenerative changes in the mid lumbar spine.  IMPRESSION: No active cardiopulmonary process status post CABG.   Original Report Authenticated By: Gerrianne Scale, M.D.    Dg Lumbar Spine 2-3 Views  09/23/2011  *RADIOLOGY REPORT*  Clinical Data: Lumbar laminectomy L4-5  LUMBAR SPINE - 2-3 VIEW  Comparison: MR 08/23/2011  Findings: The first lateral intraoperative lumbar spine radiograph demonstrates surgical instruments posterior to L5.  The second film demonstrates a metallic probe from posterior approach, tip overlying the L5 pedicles.  IMPRESSION:  1.  Intraoperative localization   Original Report Authenticated By: Osa Craver, M.D.     Disposition: 01-Home or Self Care  walk as tolerated at home.  Avoid bending, lifting, or twisting.  Ice packs to back as needed.  Change dressing daily or as needed.  OTC stool softener daily while using narcotics.      Follow-up Information    Follow up with Eldred Manges, MD. Schedule an appointment as soon as possible for a visit in 1 week.   Contact information:   [facility] [address] [place] [address] [phone]           Signed: Wende Neighbors 09/30/2011, 10:01 AM

## 2013-01-09 ENCOUNTER — Encounter: Payer: Self-pay | Admitting: General Surgery

## 2013-01-23 ENCOUNTER — Ambulatory Visit: Payer: Medicare Other | Admitting: Cardiology

## 2013-01-30 ENCOUNTER — Ambulatory Visit: Payer: Self-pay | Admitting: Cardiology

## 2013-03-08 ENCOUNTER — Ambulatory Visit (INDEPENDENT_AMBULATORY_CARE_PROVIDER_SITE_OTHER): Payer: Medicare Other | Admitting: *Deleted

## 2013-03-08 DIAGNOSIS — Z0000 Encounter for general adult medical examination without abnormal findings: Secondary | ICD-10-CM

## 2013-03-08 LAB — HEPATIC FUNCTION PANEL
ALT: 10 U/L (ref 0–35)
AST: 18 U/L (ref 0–37)
Albumin: 3.8 g/dL (ref 3.5–5.2)
Alkaline Phosphatase: 36 U/L — ABNORMAL LOW (ref 39–117)
Bilirubin, Direct: 0 mg/dL (ref 0.0–0.3)
Total Bilirubin: 0.5 mg/dL (ref 0.3–1.2)
Total Protein: 7 g/dL (ref 6.0–8.3)

## 2013-03-08 LAB — LIPID PANEL
Cholesterol: 191 mg/dL (ref 0–200)
HDL: 39.5 mg/dL (ref >39.00)
LDL Cholesterol: 128 mg/dL — ABNORMAL HIGH (ref 0–99)
Total CHOL/HDL Ratio: 5
Triglycerides: 120 mg/dL (ref 0.0–149.0)
VLDL: 24 mg/dL (ref 0.0–40.0)

## 2013-03-22 ENCOUNTER — Other Ambulatory Visit: Payer: Self-pay | Admitting: General Surgery

## 2013-03-22 DIAGNOSIS — E7800 Pure hypercholesterolemia, unspecified: Secondary | ICD-10-CM

## 2013-04-23 ENCOUNTER — Encounter: Payer: Self-pay | Admitting: General Surgery

## 2013-04-23 ENCOUNTER — Ambulatory Visit (INDEPENDENT_AMBULATORY_CARE_PROVIDER_SITE_OTHER): Payer: Medicare Other | Admitting: Cardiology

## 2013-04-23 ENCOUNTER — Encounter: Payer: Self-pay | Admitting: Cardiology

## 2013-04-23 VITALS — BP 130/76 | HR 80 | Ht 59.5 in | Wt 138.0 lb

## 2013-04-23 DIAGNOSIS — I350 Nonrheumatic aortic (valve) stenosis: Secondary | ICD-10-CM | POA: Insufficient documentation

## 2013-04-23 DIAGNOSIS — E7800 Pure hypercholesterolemia, unspecified: Secondary | ICD-10-CM | POA: Insufficient documentation

## 2013-04-23 DIAGNOSIS — I2510 Atherosclerotic heart disease of native coronary artery without angina pectoris: Secondary | ICD-10-CM

## 2013-04-23 DIAGNOSIS — I10 Essential (primary) hypertension: Secondary | ICD-10-CM

## 2013-04-23 DIAGNOSIS — I359 Nonrheumatic aortic valve disorder, unspecified: Secondary | ICD-10-CM

## 2013-04-23 NOTE — Patient Instructions (Signed)
Your physician recommends that you continue on your current medications as directed. Please refer to the Current Medication list given to you today.  You have been referred to [facility] with [facility]. Please schedule appointment with Check out today.  Your physician wants you to follow-up in: 1 year with Dr Mallie Snooks will receive a reminder letter in the mail two months in advance. If you don't receive a letter, please call our office to schedule the follow-up appointment.

## 2013-04-23 NOTE — Progress Notes (Signed)
[name], [name], [name]  [address] Phone: [phone] Fax:  [phone]  Date:  04/23/2013   ID:  [name], DOB 1931/01/19, MRN [id number]  PCP:  Mathews Argyle, MD  Cardiologist:  Fransico Him, MD     History of Present Illness: [name] is a 80 y.o. female with a history of  ASCAD s/p CABG, HTN, normal LVF, dyslipidemia and mild AS who presents today for followup.  She is doing well.  She denies any chest pain, SOB, DOE, LE edema, dizziness, palpitations or syncope.   Wt Readings from Last 3 Encounters:  04/23/13 138 lb (62.596 kg)  09/13/11 138 lb 14.2 oz (63 kg)     Past Medical History  Diagnosis Date  . Coronary artery disease     cabag 2003  . Hypertension     dr. Olivia Mackie Kimla Furth  cardiologist  . Hypothyroidism   . Cancer     ceervical  . Arthritis   . Anemia   . Aortic stenosis     mild AS by 02/18/11  [name]  . Dyslipidemia     goal LSL less then 70 but statin intolerant  . GERD (gastroesophageal reflux disease)   . OP (osteoporosis) 10/2010    fosamax  . Bradycardia     Asymtomatic resolved  . Statin intolerance   . Chronic kidney disease     renal insufficency, stage III  . Chronic renal disease, stage III   . Iron deficiency anemia 7/12  . Mild AI (aortic insufficiency)   . Mild aortic stenosis     Current Outpatient Prescriptions  Medication Sig Dispense Refill  . alendronate (FOSAMAX) 70 MG tablet Take 70 mg by mouth every 7 (seven) days. Take on Mondays. Take with a full glass of water on an empty stomach.      [name] amLODipine (NORVASC) 5 MG tablet Take 5 mg by mouth at bedtime.      [name] aspirin EC 81 MG tablet Take 81 mg by mouth at bedtime.      . carvedilol (COREG) 12.5 MG tablet Take 12.5 mg by mouth 2 (two) times daily with a meal.      . ferrous sulfate 325 (65 FE) MG tablet Take 325 mg by mouth at bedtime.      . hydrochlorothiazide (MICROZIDE) 12.5 MG capsule Take 12.5 mg by mouth at bedtime.      [name]  ibuprofen (ADVIL,MOTRIN) 200 MG tablet Take 200-400 mg by mouth every 6 (six) hours as needed. For pain      . levothyroxine (SYNTHROID, LEVOTHROID) 75 MCG tablet Take 75 mcg by mouth at bedtime.      . Multiple Vitamin (MULTIVITAMIN WITH MINERALS) TABS Take 1 tablet by mouth at bedtime.      Loma Boston (OYSTER CALCIUM) 500 MG TABS Take 500 mg of elemental calcium by mouth at bedtime.      [name] VITAMIN D, CHOLECALCIFEROL, PO Take by mouth.      . Vitamin D, Ergocalciferol, (DRISDOL) 50000 UNITS CAPS Take 50,000 Units by mouth every 7 (seven) days. Mondays       No current facility-administered medications for this visit.    Allergies:    Allergies  Allergen Reactions  . Aleve [Naproxen Sodium] Hives  . Excedrin Extra Strength [Aspirin-Acetaminophen-Caffeine]   . Statins Other (See Comments)    No energy, very weak  . Vytorin [Ezetimibe-Simvastatin]     Fatigue     Social History:  The  patient  reports that she has never smoked. She does not have any smokeless tobacco history on file. She reports that she does not drink alcohol or use illicit drugs.   Family History:  The patient's family history is not on file.   ROS:  Please see the history of present illness.      All other systems reviewed and negative.   PHYSICAL EXAM: VS:  BP 130/76  Pulse 80  Ht 4' 11.5" (1.511 m)  Wt 138 lb (62.596 kg)  BMI 27.42 kg/m2 Well nourished, well developed, in no acute distress HEENT: normal Neck: no JVD Cardiac:  normal S1, S2; RRR; no murmur Lungs:  clear to auscultation bilaterally, no wheezing, rhonchi or rales Abd: soft, nontender, no hepatomegaly Ext: no edema Skin: warm and dry Neuro:  CNs 2-12 intact, no focal abnormalities noted        ASSESSMENT AND PLAN:  1. ASCAD s/p CABG with no angina - continue ASA 2. HTN well controlled - continue amlodipine/Carvedilol/HCTZ (BMET 3/15 normal) 3. Dyslipidemia - LDL not at goal (128 on 02/2013) - continue Zetia - refer to lipid  clinic since she is statin intolerant 4. Mild AS  Followup with me in 1 year  Signed, Fransico Him, MD 04/23/2013 10:31 AM

## 2013-04-25 ENCOUNTER — Ambulatory Visit: Payer: Medicare Other | Admitting: Pharmacist

## 2013-05-14 ENCOUNTER — Ambulatory Visit: Payer: Medicare Other | Admitting: Pharmacist

## 2013-06-25 ENCOUNTER — Other Ambulatory Visit: Payer: Self-pay | Admitting: *Deleted

## 2013-06-25 MED ORDER — CARVEDILOL 12.5 MG PO TABS: 12.5000 mg | ORAL_TABLET | Freq: Two times a day (BID) | ORAL | Status: DC

## 2014-02-12 ENCOUNTER — Encounter: Payer: Self-pay | Admitting: Cardiology

## 2014-03-24 ENCOUNTER — Other Ambulatory Visit (INDEPENDENT_AMBULATORY_CARE_PROVIDER_SITE_OTHER): Payer: Medicare Other | Admitting: *Deleted

## 2014-03-24 DIAGNOSIS — E7800 Pure hypercholesterolemia, unspecified: Secondary | ICD-10-CM

## 2014-03-24 LAB — HEPATIC FUNCTION PANEL
ALT: 8 U/L (ref 0–35)
AST: 22 U/L (ref 0–37)
Albumin: 4 g/dL (ref 3.5–5.2)
Alkaline Phosphatase: 43 U/L (ref 39–117)
Bilirubin, Direct: 0.1 mg/dL (ref 0.0–0.3)
Total Bilirubin: 0.5 mg/dL (ref 0.2–1.2)
Total Protein: 7 g/dL (ref 6.0–8.3)

## 2014-03-24 LAB — LIPID PANEL
Cholesterol: 208 mg/dL — ABNORMAL HIGH (ref 0–200)
HDL: 44.8 mg/dL (ref >39.00)
LDL Cholesterol: 137 mg/dL — ABNORMAL HIGH (ref 0–99)
NonHDL: 163.2
Total CHOL/HDL Ratio: 5
Triglycerides: 129 mg/dL (ref 0.0–149.0)
VLDL: 25.8 mg/dL (ref 0.0–40.0)

## 2014-04-24 ENCOUNTER — Ambulatory Visit: Payer: Medicare Other | Admitting: Cardiology

## 2014-06-18 NOTE — Progress Notes (Signed)
Cardiology Office Note   Date:  06/19/2014   ID:  DRUE CAMERA, DOB 04/01/1931, MRN [id number]  PCP:  Mathews Argyle, MD    Chief Complaint  Patient presents with  . Follow-up    CAD, HTN and AS      History of Present Illness: [name] is an 81 y.o. female with a history of ASCAD s/p CABG, HTN, normal LVF, dyslipidemia and mild AS who presents today for followup. She is doing well. She denies any chest pain, SOB, DOE, LE edema, dizziness, palpitations or syncope.    Past Medical History  Diagnosis Date  . Hypothyroidism   . Cancer     ceervical  . Arthritis   . Anemia   . Aortic stenosis     mild AS by 02/18/11  [name]  . Dyslipidemia     goal LSL less then 70 but statin intolerant  . GERD (gastroesophageal reflux disease)   . OP (osteoporosis) 10/2010    fosamax  . Bradycardia     Asymtomatic resolved  . Statin intolerance   . Chronic kidney disease     renal insufficency, stage III  . Chronic renal disease, stage III   . Iron deficiency anemia 7/12  . Mild AI (aortic insufficiency)   . Mild aortic stenosis   . Coronary artery disease     CABG 2003  . Hypertension     Past Surgical History  Procedure Laterality Date  . Abdominal hysterectomy    . Cardiac catheterization      2004  . Cataracts    . Lumbar laminectomy  09/23/2011    Procedure: MICRODISCECTOMY LUMBAR LAMINECTOMY;  Surgeon: Marybelle Killings, MD;  Location: [facility];  Service: Orthopedics;  Laterality: N/A;  L4-5 Decompression  . Coronary artery bypass graft  2004    with LIMA to LAD, SVG to diagonal, SVG to OM, SOVG to PDA normal LVF  . Colonoscopy  09/2010    normal EGD abd COLONOSCOPY     Current Outpatient Prescriptions  Medication Sig Dispense Refill  . alendronate (FOSAMAX) 70 MG tablet Take 70 mg by mouth every 7 (seven) days. Take on Mondays. Take with a full glass of water on an empty stomach.    [name] amLODipine (NORVASC) 5 MG tablet Take 5 mg by mouth  at bedtime.    [name] aspirin EC 81 MG tablet Take 81 mg by mouth at bedtime.    . calcium gluconate 500 MG tablet Take 1 tablet by mouth daily.    . carvedilol (COREG) 12.5 MG tablet Take 1 tablet (12.5 mg total) by mouth 2 (two) times daily with a meal. 180 tablet 2  . ferrous sulfate 325 (65 FE) MG tablet Take 325 mg by mouth at bedtime.    . hydrochlorothiazide (MICROZIDE) 12.5 MG capsule Take 12.5 mg by mouth at bedtime.    [name] ibuprofen (ADVIL,MOTRIN) 200 MG tablet Take 200-400 mg by mouth every 6 (six) hours as needed. For pain    . levothyroxine (SYNTHROID, LEVOTHROID) 75 MCG tablet Take 75 mcg by mouth at bedtime.    . Multiple Vitamin (MULTIVITAMIN WITH MINERALS) TABS Take 1 tablet by mouth at bedtime.    . Vitamin D, Ergocalciferol, (DRISDOL) 50000 UNITS CAPS Take 50,000 Units by mouth every 7 (seven) days. Mondays     No current facility-administered medications for this visit.    Allergies:  Aleve; Excedrin extra strength; Statins; and Vytorin    Social History:  The patient  reports that she has never smoked. She does not have any smokeless tobacco history on file. She reports that she does not drink alcohol or use illicit drugs.   Family History:  The patient's family history includes Asthma in her father; Heart Problems in her mother.    ROS:  Please see the history of present illness.   Otherwise, review of systems are positive for knee and back pain.   All other systems are reviewed and negative.    PHYSICAL EXAM: VS:  BP 110/62 mmHg  Pulse 64  Ht 4' 11.5" (1.511 m)  Wt 128 lb 1.9 oz (58.115 kg)  BMI 25.45 kg/m2 , BMI Body mass index is 25.45 kg/(m^2). GEN: Well nourished, well developed, in no acute distress HEENT: normal Neck: no JVD, carotid bruits, or masses Cardiac: RRR; no murmurs, rubs, or gallops,no edema  Respiratory:  clear to auscultation bilaterally, normal work of breathing GI: soft, nontender, nondistended, + BS MS: no deformity or atrophy Skin: warm  and dry, no rash Neuro:  Strength and sensation are intact Psych: euthymic mood, full affect   EKG:  EKG was ordered today and showed NSR with no ST changes   Recent Labs: 03/24/2014: ALT 8    Lipid Panel    Component Value Date/Time   CHOL 208* 03/24/2014 1008   TRIG 129.0 03/24/2014 1008   HDL 44.80 03/24/2014 1008   CHOLHDL 5 03/24/2014 1008   VLDL 25.8 03/24/2014 1008   LDLCALC 137* 03/24/2014 1008      Wt Readings from Last 3 Encounters:  06/19/14 128 lb 1.9 oz (58.115 kg)  04/23/13 138 lb (62.596 kg)    ASSESSMENT AND PLAN:  1. ASCAD s/p CABG with no angina - continue ASA 2. HTN well controlled - continue amlodipine/Carvedilol/HCTZ.  Check BMET 3. Dyslipidemia - LDL not at goal (128 on 02/2013) - she stopped taking her zetia - recheck FLP and ALT 4. Mild AS - repeat echo    Current medicines are reviewed at length with the patient today.  The patient does not have concerns regarding medicines.  The following changes have been made:  no change  Labs/ tests ordered today: See above Assessment and Plan No orders of the defined types were placed in this encounter.     Disposition:   FU with me in 1 year  Signed, Sueanne Margarita, MD  06/19/2014 12:03 PM    [address], [place], [place]  [address] Phone: [phone]; Fax: [phone]

## 2014-06-19 ENCOUNTER — Ambulatory Visit (INDEPENDENT_AMBULATORY_CARE_PROVIDER_SITE_OTHER): Payer: Medicare Other | Admitting: Cardiology

## 2014-06-19 ENCOUNTER — Encounter: Payer: Self-pay | Admitting: Cardiology

## 2014-06-19 VITALS — BP 110/62 | HR 64 | Ht 59.5 in | Wt 128.1 lb

## 2014-06-19 DIAGNOSIS — I10 Essential (primary) hypertension: Secondary | ICD-10-CM

## 2014-06-19 DIAGNOSIS — I350 Nonrheumatic aortic (valve) stenosis: Secondary | ICD-10-CM | POA: Diagnosis not present

## 2014-06-19 DIAGNOSIS — E7800 Pure hypercholesterolemia, unspecified: Secondary | ICD-10-CM

## 2014-06-19 DIAGNOSIS — G4733 Obstructive sleep apnea (adult) (pediatric): Secondary | ICD-10-CM

## 2014-06-19 DIAGNOSIS — I480 Paroxysmal atrial fibrillation: Secondary | ICD-10-CM

## 2014-06-19 DIAGNOSIS — I2510 Atherosclerotic heart disease of native coronary artery without angina pectoris: Secondary | ICD-10-CM | POA: Diagnosis not present

## 2014-06-19 NOTE — Patient Instructions (Addendum)
Medication Instructions:  Your physician recommends that you continue on your current medications as directed. Please refer to the Current Medication list given to you today.   Labwork: TODAY: BMET, LFTs, Lipids  Testing/Procedures: Your physician has requested that you have an echocardiogram. Echocardiography is a painless test that uses sound waves to create images of your heart. It provides your doctor with information about the size and shape of your heart and how well your heart's chambers and valves are working. This procedure takes approximately one hour. There are no restrictions for this procedure.  Follow-Up: Your physician wants you to follow-up in: 1 year with Dr. Radford Pax. You will receive a reminder letter in the mail two months in advance. If you don't receive a letter, please call our office to schedule the follow-up appointment.   Any Other Special Instructions Will Be Listed Below (If Applicable).

## 2014-07-01 ENCOUNTER — Ambulatory Visit (HOSPITAL_COMMUNITY): Payer: Medicare Other | Attending: Cardiology

## 2014-07-01 ENCOUNTER — Other Ambulatory Visit: Payer: Self-pay

## 2014-07-01 DIAGNOSIS — I350 Nonrheumatic aortic (valve) stenosis: Secondary | ICD-10-CM

## 2014-07-01 DIAGNOSIS — I2510 Atherosclerotic heart disease of native coronary artery without angina pectoris: Secondary | ICD-10-CM

## 2014-07-01 DIAGNOSIS — I480 Paroxysmal atrial fibrillation: Secondary | ICD-10-CM

## 2014-07-01 DIAGNOSIS — I351 Nonrheumatic aortic (valve) insufficiency: Secondary | ICD-10-CM | POA: Insufficient documentation

## 2014-07-04 ENCOUNTER — Other Ambulatory Visit: Payer: Self-pay

## 2014-07-04 NOTE — Addendum Note (Signed)
Addended by: Harland German A on: 07/04/2014 04:22 PM   Modules accepted: Orders

## 2014-07-08 NOTE — Telephone Encounter (Signed)
This encounter was created in error - please disregard.

## 2014-09-02 ENCOUNTER — Ambulatory Visit: Payer: Medicare Other | Attending: Sports Medicine

## 2014-09-02 DIAGNOSIS — R6889 Other general symptoms and signs: Secondary | ICD-10-CM | POA: Diagnosis present

## 2014-09-02 DIAGNOSIS — M545 Low back pain, unspecified: Secondary | ICD-10-CM

## 2014-09-02 DIAGNOSIS — G8929 Other chronic pain: Secondary | ICD-10-CM | POA: Diagnosis present

## 2014-09-02 DIAGNOSIS — M2560 Stiffness of unspecified joint, not elsewhere classified: Secondary | ICD-10-CM | POA: Insufficient documentation

## 2014-09-02 DIAGNOSIS — R293 Abnormal posture: Secondary | ICD-10-CM | POA: Diagnosis present

## 2014-09-02 NOTE — Therapy (Signed)
[place], [place], [address] Phone: [phone]   Fax:  [phone]  Physical Therapy Evaluation  Patient Details  Name: [name] MRN: [id number] Date of Birth: 1931-10-12 Referring Provider:  Tristan Schroeder, [name]  Encounter Date: 09/02/2014      PT End of Session - 09/02/14 1454    Visit Number 1   Number of Visits 12   Date for PT Re-Evaluation 10/14/14   Authorization Type Medicare KX at visit 15   Authorization Time Period 09/02/14 to 10/14/14   Authorization - Visit Number 1   Authorization - Number of Visits 12   PT Start Time 0210   PT Stop Time 0253   PT Time Calculation (min) 43 min   Activity Tolerance Patient tolerated treatment well   Behavior During Therapy [facility] for tasks assessed/performed      Past Medical History  Diagnosis Date  . Hypothyroidism   . Cancer     ceervical  . Arthritis   . Anemia   . Aortic stenosis     mild AS by 02/18/11  [name])  . Dyslipidemia     goal LSL less then 70 but statin intolerant  . GERD (gastroesophageal reflux disease)   . OP (osteoporosis) 10/2010    fosamax  . Bradycardia     Asymtomatic resolved  . Statin intolerance   . Chronic kidney disease     renal insufficency, stage III  . Chronic renal disease, stage III   . Iron deficiency anemia 7/12  . Mild AI (aortic insufficiency)   . Mild aortic stenosis   . Coronary artery disease     CABG 2003  . Hypertension     Past Surgical History  Procedure Laterality Date  . Abdominal hysterectomy    . Cardiac catheterization      2004  . Cataracts    . Lumbar laminectomy  09/23/2011    Procedure: MICRODISCECTOMY LUMBAR LAMINECTOMY;  Surgeon: Joann Killings, MD;  Location: [place];  Service: Orthopedics;  Laterality: N/A;  L4-5 Decompression  . Coronary artery bypass graft  2004    with LIMA to LAD, SVG to diagonal, SVG to OM, SOVG to PDA normal LVF  . Colonoscopy  09/2010    normal EGD  abd COLONOSCOPY    There were no vitals filed for this visit.  Visit Diagnosis:  Abnormal posture  Chronic lower back pain  Joint stiffness of spine  Activity intolerance      Subjective Assessment - 09/02/14 1409    Subjective She reports LBP for a year or more. The lumbar surgery did not help.   She had surgery. with exercies  with leg lifts squats. No exercies at home. Does some home tasks and gardening. She was no different post surgery with PT.    Pertinent History Lumbar surgery 11/2013.    Limitations --  She has pain with bending. Limited with activity due to pain.    How long can you sit comfortably? As needed   How long can you stand comfortably? 15 min   How long can you walk comfortably? 50 - 100 feet   Diagnostic tests MRI  : OA    Patient Stated Goals To be pain free   Currently in Pain? No/denies   Pain Score --  Worse pain with beiing on feet 8/10   Pain Location Back   Pain Orientation Mid   Pain Descriptors / Indicators Dull;Aching   Pain Type  Chronic pain   Pain Radiating Towards occasionally pain into one leg but this is rare   Pain Onset More than a month ago   Pain Frequency Intermittent   Aggravating Factors  Stand and walking   Pain Relieving Factors sitting   Multiple Pain Sites No            [facility] PT Assessment - 09/02/14 1407    Assessment   Medical Diagnosis lumbar radiculopathy   Precautions   Precautions None   Restrictions   Weight Bearing Restrictions No   Balance Screen   Has the patient fallen in the past 6 months Yes   How many times? 2  tripped over edge of grate in floor . [place] residence   Type of [place]   Prior Function   Level of [place]  She has family doing heavy work   Charity fundraiser Status Within Functional Limits for tasks assessed   Posture/Postural Control   Posture Comments scoliosis , decr lumbar lordosis   ROM /  Strength   AROM / PROM / Strength AROM;Strength   AROM   AROM Assessment Site Lumbar   Lumbar Flexion She is able to touch her toes but needs to push on thighs to return to standing   Lumbar Extension 5   Lumbar - Right Side Bend 18   Lumbar - Left Side Bend 20   Lumbar - Right Rotation 40%    Lumbar - Left Rotation 25%   Strength   Overall Strength Comments Normal strength in knees /ankles with soem discomfort in knees  with hip strength 4/5 with report of pain in back. Basic abdominal strength good   Flexibility   Soft Tissue Assessment /Muscle Length yes   Ambulation/Gait   Gait Comments WNL no device                           PT Education - 09/02/14 1453    Education provided Yes   Education Details POC, HEP   Person(s) Educated Patient   Methods Explanation;Demonstration;Tactile cues;Handout   Comprehension Returned demonstration;Verbalized understanding          PT Short Term Goals - 09/02/14 1459    PT SHORT TERM GOAL #1   Title She will be independent with inital HEP   Time 3   Period Weeks   Status New   PT SHORT TERM GOAL #2   Title She will report decreased pain with walking in home.    Time 3   Period Weeks   Status New   PT SHORT TERM GOAL #3   Title She will be able to rise from flexion without pushing on thighs.    Time 3   Period Weeks   Status New           PT Long Term Goals - 09/02/14 1500    PT LONG TERM GOAL #1   Title she    Baseline ll be independent with all HEP issued as of last visit   Time 6   Period Weeks   Status New   PT LONG TERM GOAL #2   Title She will report 50% impprovement in pain with walking in community   Time 6   Period Weeks   Status New   PT LONG TERM GOAL #3   Title She will report doing more home tasks due to  decreased pain   Time 6   Period Weeks   Status New   PT LONG TERM GOAL #4   Title She will report 40-50% decreased LBP generally during the day   Time 6   Period Weeks    Status New               Plan - 09/24/2014 1455    Clinical Impression Statement [name] demonstrated pain with extended positions and no pain with flexed postures. Full flexion hurt and she need time to stand erect. She has abnormal posture and decreaaed tolerance for activity on feet. She should make some modest improvement with PT   Pt will benefit from skilled therapeutic intervention in order to improve on the following deficits Decreased activity tolerance;Pain;Increased muscle spasms;Decreased range of motion;Postural dysfunction   Rehab Potential Good   PT Frequency 2x / week   PT Duration 6 weeks   PT Treatment/Interventions Traction;Ultrasound;Electrical Stimulation;Moist Heat;Patient/family education;Manual techniques;Taping;Passive range of motion;Therapeutic exercise   PT Next Visit Plan Advance HEP ,modalities as needed, manual   PT Home Exercise Plan stretching and lower abdominal set   Consulted and Agree with Plan of Care Patient          G-Codes - Sep 24, 2014 1504    Functional Assessment Tool Used clinical judgement   Functional Limitation Mobility: Walking and moving around   Mobility: Walking and Moving Around Current Status [phone]) At least 40 percent but less than 60 percent impaired, limited or restricted   Mobility: Walking and Moving Around Goal Status [phone]) At least 20 percent but less than 40 percent impaired, limited or restricted       Problem List Patient Active Problem List   Diagnosis Date Noted  . Coronary atherosclerosis of native coronary artery 04/23/2013  . Essential hypertension, benign 04/23/2013  . Pure hypercholesterolemia 04/23/2013  . Aortic stenosis   . Spinal stenosis, lumbar 09/23/2011    Class: Diagnosis of    Darrel Hoover 09/24/14, 3:08 PM  [facility] [address] [place], [place], [address] Phone: [phone]   Fax:  [phone]

## 2014-09-02 NOTE — Patient Instructions (Signed)
Issued from cabinet Knee to chest and lower trunk rotation 3x/day 2-3 reps 20-30 sec stretch and tranverse abdominus exercises x 10 3-5 sec hold

## 2014-09-09 ENCOUNTER — Ambulatory Visit: Payer: Medicare Other | Admitting: Physical Therapy

## 2014-09-09 DIAGNOSIS — M545 Low back pain, unspecified: Secondary | ICD-10-CM

## 2014-09-09 DIAGNOSIS — R293 Abnormal posture: Secondary | ICD-10-CM | POA: Diagnosis not present

## 2014-09-09 DIAGNOSIS — G8929 Other chronic pain: Secondary | ICD-10-CM

## 2014-09-09 DIAGNOSIS — M2560 Stiffness of unspecified joint, not elsewhere classified: Secondary | ICD-10-CM

## 2014-09-09 NOTE — Therapy (Signed)
[place], [place], [address] Phone: [phone]   Fax:  [phone]  Physical Therapy Treatment  Patient Details  Name: [name] MRN: [id number] Date of Birth: 09-Jan-1932 Referring Provider:  Lajean Manes, MD  Encounter Date: 09/09/2014      PT End of Session - 09/09/14 0846    Visit Number 2   Number of Visits 12   Date for PT Re-Evaluation 10/14/14   Authorization Type Medicare KX at visit 15   Authorization Time Period 09/02/14 to 10/14/14   Authorization - Visit Number 2   Authorization - Number of Visits 12   PT Start Time 0846   PT Stop Time 0926   PT Time Calculation (min) 40 min   Activity Tolerance Patient tolerated treatment well   Behavior During Therapy [facility] for tasks assessed/performed      Past Medical History  Diagnosis Date  . Hypothyroidism   . Cancer     ceervical  . Arthritis   . Anemia   . Aortic stenosis     mild AS by 02/18/11  [name]  . Dyslipidemia     goal LSL less then 70 but statin intolerant  . GERD (gastroesophageal reflux disease)   . OP (osteoporosis) 10/2010    fosamax  . Bradycardia     Asymtomatic resolved  . Statin intolerance   . Chronic kidney disease     renal insufficency, stage III  . Chronic renal disease, stage III   . Iron deficiency anemia 7/12  . Mild AI (aortic insufficiency)   . Mild aortic stenosis   . Coronary artery disease     CABG 2003  . Hypertension     Past Surgical History  Procedure Laterality Date  . Abdominal hysterectomy    . Cardiac catheterization      2004  . Cataracts    . Lumbar laminectomy  09/23/2011    Procedure: MICRODISCECTOMY LUMBAR LAMINECTOMY;  Surgeon: Marybelle Killings, MD;  Location: [place];  Service: Orthopedics;  Laterality: N/A;  L4-5 Decompression  . Coronary artery bypass graft  2004    with LIMA to LAD, SVG to diagonal, SVG to OM, SOVG to PDA normal LVF  . Colonoscopy  09/2010    normal EGD abd  COLONOSCOPY    There were no vitals filed for this visit.  Visit Diagnosis:  Chronic lower back pain  Joint stiffness of spine  Abnormal posture      Subjective Assessment - 09/09/14 0849    Subjective Patient reports she hasn't been well due to new medications she was taking starting last week. She stopped taking them and is feeling a little better. She has been doing her HEP.   Currently in Pain? No/denies                         [facility] Adult PT Treatment/Exercise - 09/09/14 0001    Exercises   Exercises Lumbar   Lumbar Exercises: Stretches   Single Knee to Chest Stretch 3 reps;20 seconds  bil   Lower Trunk Rotation 5 reps;10 seconds   Lumbar Exercises: Seated   Sit to Stand 10 reps  arms crossed; no problem   Other Seated Lumbar Exercises pelvic rock x 10 with tactile cues   Lumbar Exercises: Supine   Ab Set 10 reps;5 seconds  with knee drop; marching and heel slides x 5 ea bil   AB Set Limitations using pelvic floor  cuing   Clam 20 reps  with green band   Bridge 10 reps   Bridge Limitations some pain in middle of back   Other Supine Lumbar Exercises decompression leg press 10 sec hold x 3 bil   Lumbar Exercises: Prone   Straight Leg Raise 10 reps   Other Prone Lumbar Exercises pelvic press 5 x 5 sec, with single knee bend and bil knee bend x 5                PT Education - 09/09/14 0930    Education provided Yes   Education Details HEP: TransAb with clam, march and heel slide   Person(s) Educated Patient   Methods Explanation;Demonstration;Handout   Comprehension Verbalized understanding;Returned demonstration          PT Short Term Goals - 09/02/14 1459    PT SHORT TERM GOAL #1   Title She will be independent with inital HEP   Time 3   Period Weeks   Status New   PT SHORT TERM GOAL #2   Title She will report decreased pain with walking in home.    Time 3   Period Weeks   Status New   PT SHORT TERM GOAL #3   Title She will  be able to rise from flexion without pushing on thighs.    Time 3   Period Weeks   Status New           PT Long Term Goals - 09/02/14 1500    PT LONG TERM GOAL #1   Title she    Baseline ll be independent with all HEP issued as of last visit   Time 6   Period Weeks   Status New   PT LONG TERM GOAL #2   Title She will report 50% impprovement in pain with walking in community   Time 6   Period Weeks   Status New   PT LONG TERM GOAL #3   Title She will report doing more home tasks due to decreased pain   Time 6   Period Weeks   Status New   PT LONG TERM GOAL #4   Title She will report 40-50% decreased LBP generally during the day   Time 6   Period Weeks   Status New               Plan - 09/09/14 0931    Clinical Impression Statement Patient tolerated therex well today with no increased pain. She demos good transverse ab and MF strength and has good body awareness. No goals met as only first full treatment.   PT Next Visit Plan Review HEP, manual and modalities prn.        Problem List Patient Active Problem List   Diagnosis Date Noted  . Coronary atherosclerosis of native coronary artery 04/23/2013  . Essential hypertension, benign 04/23/2013  . Pure hypercholesterolemia 04/23/2013  . Aortic stenosis   . Spinal stenosis, lumbar 09/23/2011    Class: Diagnosis of   Madelyn Flavors PT  09/09/2014, 9:34 AM  [facility] [address] [place], [place], [address] Phone: [phone]   Fax:  [phone]

## 2014-09-09 NOTE — Patient Instructions (Signed)
  Abdominal Bracing With Pelvic Floor (Hook-Lying)   With neutral spine, tighten pelvic floor and abdominals. Hold 10 seconds. Repeat __10_ times. Do _1__ times a day.   Knee to Chest: Transverse Plane Stability   Do bracing above. Bring one knee up, then return. Be sure pelvis does not roll side to side. Keep pelvis still. Lift knee __10_ times each leg. Restabilize pelvis. Repeat with other leg. Do _1-2__ sets, _1__ times per day.    Hip External Rotation With Pillow: Transverse Plane Stability   One knee bent, one leg straight, on pillow or both knees bent. Slowly roll bent knee out. Be sure pelvis does not rotate. Do _10__ times. Restabilize pelvis. Repeat with other leg. Do _1-2__ sets, _1__ times per day.  Heel Slide: 4-10 Inches - Transverse Plane Stability   Slide heel 4 inches down. Be sure pelvis does not rotate. Do _10__ times. Restabilize pelvis. Repeat with other leg. Do __1_ sets, _1__ times per day.   Madelyn Flavors, PT 09/09/2014 9:04 AM

## 2014-09-16 ENCOUNTER — Ambulatory Visit: Payer: Medicare Other

## 2014-09-16 DIAGNOSIS — R293 Abnormal posture: Secondary | ICD-10-CM | POA: Diagnosis not present

## 2014-09-16 DIAGNOSIS — G8929 Other chronic pain: Secondary | ICD-10-CM

## 2014-09-16 DIAGNOSIS — M2560 Stiffness of unspecified joint, not elsewhere classified: Secondary | ICD-10-CM

## 2014-09-16 DIAGNOSIS — M545 Low back pain, unspecified: Secondary | ICD-10-CM

## 2014-09-16 NOTE — Therapy (Signed)
[place], [place], [address] Phone: [phone]   Fax:  [phone]  Physical Therapy Treatment  Patient Details  Name: [name] MRN: [id number] Date of Birth: 1931-05-19 Referring Provider:  Lajean Manes, MD  Encounter Date: 09/16/2014      PT End of Session - 09/16/14 1410    Visit Number 3   Number of Visits 12   Date for PT Re-Evaluation 10/14/14   PT Start Time 0130   PT Stop Time 0215   PT Time Calculation (min) 45 min   Activity Tolerance Patient tolerated treatment well   Behavior During Therapy [facility] for tasks assessed/performed      Past Medical History  Diagnosis Date  . Hypothyroidism   . Cancer     ceervical  . Arthritis   . Anemia   . Aortic stenosis     mild AS by 02/18/11  [name]  . Dyslipidemia     goal LSL less then 70 but statin intolerant  . GERD (gastroesophageal reflux disease)   . OP (osteoporosis) 10/2010    fosamax  . Bradycardia     Asymtomatic resolved  . Statin intolerance   . Chronic kidney disease     renal insufficency, stage III  . Chronic renal disease, stage III   . Iron deficiency anemia 7/12  . Mild AI (aortic insufficiency)   . Mild aortic stenosis   . Coronary artery disease     CABG 2003  . Hypertension     Past Surgical History  Procedure Laterality Date  . Abdominal hysterectomy    . Cardiac catheterization      2004  . Cataracts    . Lumbar laminectomy  09/23/2011    Procedure: MICRODISCECTOMY LUMBAR LAMINECTOMY;  Surgeon: Marybelle Killings, MD;  Location: [place];  Service: Orthopedics;  Laterality: N/A;  L4-5 Decompression  . Coronary artery bypass graft  2004    with LIMA to LAD, SVG to diagonal, SVG to OM, SOVG to PDA normal LVF  . Colonoscopy  09/2010    normal EGD abd COLONOSCOPY    There were no vitals filed for this visit.  Visit Diagnosis:  Chronic lower back pain  Joint stiffness of spine  Abnormal posture       Subjective Assessment - 09/16/14 1334    Subjective No real pain just uncomfortable. No changes. She is doing exercises 1-2x/week . Busy this week due to prep for flea market.    Currently in Pain? No/denies   Multiple Pain Sites No                         [facility] Adult PT Treatment/Exercise - 09/16/14 1337    Lumbar Exercises: Stretches   Single Knee to Chest Stretch 30 seconds;1 rep   Lower Trunk Rotation 2 reps;30 seconds   Lumbar Exercises: Supine   Ab Set 10 reps;5 seconds   AB Set Limitations using pelvic floor cuing with heel slides x 8 RT and LT leg, and with knee drop RT and LT x 8  each   Clam 20 reps   Clam Limitations RT /LT with green band with pelvic tilt   Bridge 10 reps   Bridge Limitations some pain in middle of back. Asked her to stop if pain worsened. It did not so did 10 reps with ball squeeze.   Other Supine Lumbar Exercises decompression leg press 5 sec hold x 10 bil  Other Supine Lumbar Exercises head and shoulder raise with cue to pull ribs to hips in pre pilates 100's x5 with                  PT Short Term Goals - 09/02/14 1459    PT SHORT TERM GOAL #1   Title She will be independent with inital HEP   Time 3   Period Weeks   Status New   PT SHORT TERM GOAL #2   Title She will report decreased pain with walking in home.    Time 3   Period Weeks   Status New   PT SHORT TERM GOAL #3   Title She will be able to rise from flexion without pushing on thighs.    Time 3   Period Weeks   Status New           PT Long Term Goals - 09/02/14 1500    PT LONG TERM GOAL #1   Title she    Baseline ll be independent with all HEP issued as of last visit   Time 6   Period Weeks   Status New   PT LONG TERM GOAL #2   Title She will report 50% impprovement in pain with walking in community   Time 6   Period Weeks   Status New   PT LONG TERM GOAL #3   Title She will report doing more home tasks due to decreased pain   Time 6    Period Weeks   Status New   PT LONG TERM GOAL #4   Title She will report 40-50% decreased LBP generally during the day   Time 6   Period Weeks   Status New               Plan - 09/16/14 1413    Clinical Impression Statement Ms Domenica Fail did well without increased pain though she was more careful with extended positions(bridge as she did have some pain with this. We will advance HEP next visit as she will have a very busy weekend out of town.   PT Next Visit Plan Review HEP, manual and modalities prn.   PT Home Exercise Plan stretching and lower abdominal set   Consulted and Agree with Plan of Care Patient        Problem List Patient Active Problem List   Diagnosis Date Noted  . Coronary atherosclerosis of native coronary artery 04/23/2013  . Essential hypertension, benign 04/23/2013  . Pure hypercholesterolemia 04/23/2013  . Aortic stenosis   . Spinal stenosis, lumbar 09/23/2011    Class: Diagnosis of    Darrel Hoover PT 09/16/2014, 2:15 PM  [facility] [address] [place], [place], [address] Phone: [phone]   Fax:  [phone]

## 2014-09-23 ENCOUNTER — Ambulatory Visit: Payer: Medicare Other | Attending: Sports Medicine

## 2014-09-23 DIAGNOSIS — G8929 Other chronic pain: Secondary | ICD-10-CM | POA: Diagnosis present

## 2014-09-23 DIAGNOSIS — M2560 Stiffness of unspecified joint, not elsewhere classified: Secondary | ICD-10-CM

## 2014-09-23 DIAGNOSIS — R293 Abnormal posture: Secondary | ICD-10-CM

## 2014-09-23 DIAGNOSIS — M545 Low back pain, unspecified: Secondary | ICD-10-CM

## 2014-09-23 DIAGNOSIS — R6889 Other general symptoms and signs: Secondary | ICD-10-CM | POA: Diagnosis present

## 2014-09-23 NOTE — Therapy (Signed)
[place], [place], [address] Phone: [phone]   Fax:  [phone]  Physical Therapy Treatment  Patient Details  Name: [name] MRN: [id number] Date of Birth: 1931-04-18 Referring Provider:  Lajean Manes, MD  Encounter Date: 09/23/2014      PT End of Session - 09/23/14 1546    Visit Number 4   Number of Visits 12   Date for PT Re-Evaluation 10/14/14   PT Start Time 0300   PT Stop Time 0345   PT Time Calculation (min) 45 min   Activity Tolerance Patient tolerated treatment well   Behavior During Therapy [facility] for tasks assessed/performed      Past Medical History  Diagnosis Date  . Hypothyroidism   . Cancer     ceervical  . Arthritis   . Anemia   . Aortic stenosis     mild AS by 02/18/11  [name]  . Dyslipidemia     goal LSL less then 70 but statin intolerant  . GERD (gastroesophageal reflux disease)   . OP (osteoporosis) 10/2010    fosamax  . Bradycardia     Asymtomatic resolved  . Statin intolerance   . Chronic kidney disease     renal insufficency, stage III  . Chronic renal disease, stage III   . Iron deficiency anemia 7/12  . Mild AI (aortic insufficiency)   . Mild aortic stenosis   . Coronary artery disease     CABG 2003  . Hypertension     Past Surgical History  Procedure Laterality Date  . Abdominal hysterectomy    . Cardiac catheterization      2004  . Cataracts    . Lumbar laminectomy  09/23/2011    Procedure: MICRODISCECTOMY LUMBAR LAMINECTOMY;  Surgeon: Marybelle Killings, MD;  Location: [place];  Service: Orthopedics;  Laterality: N/A;  L4-5 Decompression  . Coronary artery bypass graft  2004    with LIMA to LAD, SVG to diagonal, SVG to OM, SOVG to PDA normal LVF  . Colonoscopy  09/2010    normal EGD abd COLONOSCOPY    There were no vitals filed for this visit.  Visit Diagnosis:  Chronic lower back pain  Joint stiffness of spine  Abnormal  posture                       [facility] Adult PT Treatment/Exercise - 09/23/14 1447    Lumbar Exercises: Stretches   Single Knee to Chest Stretch 1 rep;30 seconds  RT and LT   Lower Trunk Rotation 1 rep;30 seconds   Pelvic Tilt 5 reps;10 seconds   Standing Side Bend 2 reps;20 seconds  did seated   Lumbar Exercises: Seated   Sit to Stand 15 reps   Other Seated Lumbar Exercises pelvic rock x 12 with tactile cues   Lumbar Exercises: Supine   Ab Set 10 reps;5 seconds   Clam 20 reps   Clam Limitations RT /LT with green band with pelvic tilt   Bent Knee Raise --  RT and LT 8 reps with post pelvic tilt   Bridge 10 reps   Bridge Limitations but done wiht posterior tilt and glute set so range is small    Other Supine Lumbar Exercises posterior pelvic tilt RT and lt knee drop to side. cued for technique   Lumbar Exercises: Sidelying   Hip Abduction --  12 reps RT and LT  PT Education - 09/23/14 1546    Education provided Yes   Education Details Hip hinge   Person(s) Educated Patient   Methods Explanation;Demonstration;Tactile cues;Verbal cues;Handout   Comprehension Returned demonstration;Verbalized understanding          PT Short Term Goals - 09/23/14 1548    PT SHORT TERM GOAL #1   Title She will be independent with inital HEP   Status Achieved   PT SHORT TERM GOAL #2   Title She will report decreased pain with walking in home.    Status Achieved   PT SHORT TERM GOAL #3   Title She will be able to rise from flexion without pushing on thighs.    Status On-going           PT Long Term Goals - 09/02/14 1500    PT LONG TERM GOAL #1   Title she    Baseline ll be independent with all HEP issued as of last visit   Time 6   Period Weeks   Status New   PT LONG TERM GOAL #2   Title She will report 50% impprovement in pain with walking in community   Time 6   Period Weeks   Status New   PT LONG TERM GOAL #3   Title She will report  doing more home tasks due to decreased pain   Time 6   Period Weeks   Status New   PT LONG TERM GOAL #4   Title She will report 40-50% decreased LBP generally during the day   Time 6   Period Weeks   Status New               Plan - 09/23/14 1547    Clinical Impression Statement she did the execises without increased pain. she opted to decrease frequency to 1x/week due to finances. Will review HEP and add next visit   PT Next Visit Plan Review HEP, manual and modalities prn.   Consulted and Agree with Plan of Care Patient        Problem List Patient Active Problem List   Diagnosis Date Noted  . Coronary atherosclerosis of native coronary artery 04/23/2013  . Essential hypertension, benign 04/23/2013  . Pure hypercholesterolemia 04/23/2013  . Aortic stenosis   . Spinal stenosis, lumbar 09/23/2011    Class: Diagnosis of    Darrel Hoover PT 09/23/2014, 3:49 PM  [facility] [address] [place], [place], [address] Phone: [phone]   Fax:  [phone]

## 2014-09-23 NOTE — Patient Instructions (Signed)
Hip Extension: Hamstring Single Leg Deadlift (Eccentric)  Copyright  VHI. All rights reserved.  Squat: Wide Leg   Copyright  VHI. All rights reserved.  Squat: Half   Arms hanging at sides, squat by dropping hips back as if sitting on a chair. Keep knees over ankles, hips behind heels.  Keep back straight.  Bend at hips and knees will bend with hips.   Repeat ___3-15_ times per set. Do __1-2__ sets per session. Do __2-5__ sessions per week. Use __0-10HIP / KNEE: Flexion / Extension, Squat Unilateral Hip / Glute Extension: Standing - Straight Leg (Machine) Hip Extension (Standing) Healthy Back - Yoga Tree Balance

## 2014-09-25 ENCOUNTER — Ambulatory Visit: Payer: Medicare Other

## 2014-09-26 ENCOUNTER — Other Ambulatory Visit (HOSPITAL_COMMUNITY): Payer: Self-pay | Admitting: Geriatric Medicine

## 2014-09-26 DIAGNOSIS — I350 Nonrheumatic aortic (valve) stenosis: Secondary | ICD-10-CM

## 2014-09-30 ENCOUNTER — Ambulatory Visit: Payer: Medicare Other

## 2014-09-30 DIAGNOSIS — M2560 Stiffness of unspecified joint, not elsewhere classified: Secondary | ICD-10-CM

## 2014-09-30 DIAGNOSIS — G8929 Other chronic pain: Secondary | ICD-10-CM

## 2014-09-30 DIAGNOSIS — R293 Abnormal posture: Secondary | ICD-10-CM

## 2014-09-30 DIAGNOSIS — R6889 Other general symptoms and signs: Secondary | ICD-10-CM

## 2014-09-30 DIAGNOSIS — M545 Low back pain, unspecified: Secondary | ICD-10-CM

## 2014-09-30 NOTE — Therapy (Signed)
[place], [place], [address] Phone: [phone]   Fax:  [phone]  Physical Therapy Treatment  Patient Details  Name: [name] MRN: [id number] Date of Birth: 06-14-1931 Referring Provider:  Lajean Manes, MD  Encounter Date: 09/30/2014      PT End of Session - 09/30/14 1232    Visit Number 5   Number of Visits 12   Date for PT Re-Evaluation 10/14/14   PT Start Time 5176   PT Stop Time 1230   PT Time Calculation (min) 45 min   Activity Tolerance Patient tolerated treatment well   Behavior During Therapy [facility] for tasks assessed/performed      Past Medical History  Diagnosis Date  . Hypothyroidism   . Cancer     ceervical  . Arthritis   . Anemia   . Aortic stenosis     mild AS by 02/18/11  [name]  . Dyslipidemia     goal LSL less then 70 but statin intolerant  . GERD (gastroesophageal reflux disease)   . OP (osteoporosis) 10/2010    fosamax  . Bradycardia     Asymtomatic resolved  . Statin intolerance   . Chronic kidney disease     renal insufficency, stage III  . Chronic renal disease, stage III   . Iron deficiency anemia 7/12  . Mild AI (aortic insufficiency)   . Mild aortic stenosis   . Coronary artery disease     CABG 2003  . Hypertension     Past Surgical History  Procedure Laterality Date  . Abdominal hysterectomy    . Cardiac catheterization      2004  . Cataracts    . Lumbar laminectomy  09/23/2011    Procedure: MICRODISCECTOMY LUMBAR LAMINECTOMY;  Surgeon: Marybelle Killings, MD;  Location: [place];  Service: Orthopedics;  Laterality: N/A;  L4-5 Decompression  . Coronary artery bypass graft  2004    with LIMA to LAD, SVG to diagonal, SVG to OM, SOVG to PDA normal LVF  . Colonoscopy  09/2010    normal EGD abd COLONOSCOPY    There were no vitals filed for this visit.  Visit Diagnosis:  Chronic lower back pain  Joint stiffness of spine  Abnormal posture  Activity  intolerance      Subjective Assessment - 09/30/14 1156    Subjective Allergies bothering me. No back pain . Mostly with bending my back hurts.    Currently in Pain? No/denies                         [facility] Adult PT Treatment/Exercise - 09/30/14 0001    Lumbar Exercises: Stretches   Single Knee to Chest Stretch 1 rep;30 seconds   Single Knee to Chest Stretch Limitations RT and LT   Lumbar Exercises: Supine   Ab Set 5 reps  5 sec   Clam 20 reps   Clam Limitations RT and LT  with greens band   Lumbar Exercises: Sidelying   Clam --  12 reps RT and LT   Hip Abduction --  12 reps RT and Lt   Lumbar Exercises: Prone   Straight Leg Raise --  12 reps RT and LT   Lumbar Exercises: Quadruped   Madcat/Old Horse 10 reps   Madcat/Old Horse Limitations Tactile cus for pelvic rotation, then 10 knee lifts RT/LT   Manual Therapy   Manual Therapy Soft tissue mobilization   Soft tissue mobilization  STW to glute medius and minimus RT and LT due to tnederness. , PA mobs Gr2-3 to lower lumbar spine                PT Education - 09/30/14 1231    Education provided Yes   Education Details quadraped exercises   Person(s) Educated Patient   Methods Explanation;Demonstration;Tactile cues;Verbal cues;Handout   Comprehension Returned demonstration;Verbalized understanding          PT Short Term Goals - 09/23/14 1548    PT SHORT TERM GOAL #1   Title She will be independent with inital HEP   Status Achieved   PT SHORT TERM GOAL #2   Title She will report decreased pain with walking in home.    Status Achieved   PT SHORT TERM GOAL #3   Title She will be able to rise from flexion without pushing on thighs.    Status On-going           PT Long Term Goals - 09/30/14 1323    PT LONG TERM GOAL #1   Title she will be independent with all HEP issued as of last visit   Status On-going   PT LONG TERM GOAL #2   Title She will report 50% impprovement in pain with  walking in community   Status Partially Met   PT LONG TERM GOAL #3   Title She will report doing more home tasks due to decreased pain   Status On-going   PT LONG TERM GOAL #4   Title She will report 40-50% decreased LBP generally during the day   Status On-going               Plan - 09/30/14 1321    Clinical Impression Statement She is doing well but is still limited with extension exercises due to pain. Also noted tenderness RT>LT glut med and min . Not much in maximus   PT Next Visit Plan Review HEP, manual and modalities prn.    Assess flexion movement and need to assit with arms to return to standing   PT Home Exercise Plan quadraped exer added today   Consulted and Agree with Plan of Care Patient        Problem List Patient Active Problem List   Diagnosis Date Noted  . Coronary atherosclerosis of native coronary artery 04/23/2013  . Essential hypertension, benign 04/23/2013  . Pure hypercholesterolemia 04/23/2013  . Aortic stenosis   . Spinal stenosis, lumbar 09/23/2011    Class: Diagnosis of    Darrel Hoover PT 09/30/2014, 1:25 PM  [facility] [address] [place], [place], [address] Phone: [phone]   Fax:  [phone]

## 2014-10-01 ENCOUNTER — Other Ambulatory Visit (HOSPITAL_COMMUNITY): Payer: Medicare Other

## 2014-10-07 ENCOUNTER — Ambulatory Visit: Payer: Medicare Other

## 2014-10-07 DIAGNOSIS — M545 Low back pain, unspecified: Secondary | ICD-10-CM

## 2014-10-07 DIAGNOSIS — G8929 Other chronic pain: Secondary | ICD-10-CM

## 2014-10-07 DIAGNOSIS — R293 Abnormal posture: Secondary | ICD-10-CM

## 2014-10-07 DIAGNOSIS — M2560 Stiffness of unspecified joint, not elsewhere classified: Secondary | ICD-10-CM

## 2014-10-07 NOTE — Therapy (Addendum)
[place], [place], [address] Phone: [phone]   Fax:  [phone]  Physical Therapy Treatment  Patient Details  Name: [name] MRN: [id number] Date of Birth: [birth year] Referring Provider:  Lajean Manes, MD  Encounter Date: 10/07/2014      PT End of Session - 10/07/14 1309    Visit Number 6   Number of Visits 12   Date for PT Re-Evaluation 10/14/14   PT Start Time 1145   PT Stop Time 1230   PT Time Calculation (min) 45 min   Activity Tolerance Patient tolerated treatment well   Behavior During Therapy [facility] for tasks assessed/performed      Past Medical History  Diagnosis Date  . Hypothyroidism   . Cancer     ceervical  . Arthritis   . Anemia   . Aortic stenosis     mild AS by 02/18/11  [name]  . Dyslipidemia     goal LSL less then 70 but statin intolerant  . GERD (gastroesophageal reflux disease)   . OP (osteoporosis) 10/2010    fosamax  . Bradycardia     Asymtomatic resolved  . Statin intolerance   . Chronic kidney disease     renal insufficency, stage III  . Chronic renal disease, stage III   . Iron deficiency anemia 7/12  . Mild AI (aortic insufficiency)   . Mild aortic stenosis   . Coronary artery disease     CABG 2003  . Hypertension     Past Surgical History  Procedure Laterality Date  . Abdominal hysterectomy    . Cardiac catheterization      2004  . Cataracts    . Lumbar laminectomy  09/23/2011    Procedure: MICRODISCECTOMY LUMBAR LAMINECTOMY;  Surgeon: Marybelle Killings, MD;  Location: [place];  Service: Orthopedics;  Laterality: N/A;  L4-5 Decompression  . Coronary artery bypass graft  2004    with LIMA to LAD, SVG to diagonal, SVG to OM, SOVG to PDA normal LVF  . Colonoscopy  09/2010    normal EGD abd COLONOSCOPY    There were no vitals filed for this visit.  Visit Diagnosis:  Chronic lower back pain  Joint stiffness of spine  Abnormal posture       Subjective Assessment - 10/07/14 1150    Subjective Stiff this AM. Didnnt do stretches as I got up late.  No pain   Currently in Pain? No/denies                         [facility] Adult PT Treatment/Exercise - 10/07/14 1151    Lumbar Exercises: Stretches   Single Knee to Chest Stretch 1 rep;30 seconds   Single Knee to Chest Stretch Limitations RT and LT   Lower Trunk Rotation 2 reps;30 seconds   Lower Trunk Rotation Limitations RT and LT   Pelvic Tilt Limitations 10 reps 5 sec holds.    Piriformis Stretch 2 reps;30 seconds  RT and LT   Lumbar Exercises: Seated   Other Seated Lumbar Exercises pelvic rock x 12 with tactile cues   Lumbar Exercises: Supine   AB Set Limitations using pelvic tilt cuing with heel slides x 8 RT and LT leg, and with knee drop RT and LT x 8  each   Clam Limitations blue band  5x5 reps with post pelvic tilt   Bridge 10 reps   Bridge Limitations but done with posterior tilt  and glute set so range is small    Other Supine Lumbar Exercises pilates pre 100's x 12    Lumbar Exercises: Prone   Other Prone Lumbar Exercises pelvic press 5 x 5 sec, with single knee bend and bil knee bend x 5   Other Prone Lumbar Exercises multifidus facilitation x10 RT and Lt  followed by SLR x10 and x5 hold 5 sec  RT and LT                  PT Short Term Goals - 10/07/14 1313    PT SHORT TERM GOAL #1   Title She will be independent with inital HEP   Status Achieved   PT SHORT TERM GOAL #2   Title She will report decreased pain with walking in home.    Status Achieved   PT SHORT TERM GOAL #3   Title She will be able to rise from flexion without pushing on thighs.    Status Achieved           PT Long Term Goals - 09/30/14 1323    PT LONG TERM GOAL #1   Title she will be independent with all HEP issued as of last visit   Status On-going   PT LONG TERM GOAL #2   Title She will report 50% impprovement in pain with walking in community   Status  Partially Met   PT LONG TERM GOAL #3   Title She will report doing more home tasks due to decreased pain   Status On-going   PT LONG TERM GOAL #4   Title She will report 40-50% decreased LBP generally during the day   Status On-going               Plan - 10/07/14 1311    Clinical Impression Statement She is still doing well with minimal episode of pain except with exgtension exercises . She will benefit from additional HEP but we will be limited as to pain levels though shehas improved. Continue x 2 visits over next 2 weeks   PT Next Visit Plan Add to core stability exercises   Consulted and Agree with Plan of Care Patient        Problem List Patient Active Problem List   Diagnosis Date Noted  . Coronary atherosclerosis of native coronary artery 04/23/2013  . Essential hypertension, benign 04/23/2013  . Pure hypercholesterolemia 04/23/2013  . Aortic stenosis   . Spinal stenosis, lumbar 09/23/2011    Class: Diagnosis of    Darrel Hoover PT 10/07/2014, 1:14 PM  [facility] [address] [place], [place], [address] Phone: [phone]   Fax:  [phone]     PHYSICAL THERAPY DISCHARGE SUMMARY  Visits from Start of Care: 6  Current functional level related to goals / functional outcomes: See above   Remaining deficits: Unknown   Education / Equipment: HEP Plan:                                                    Patient goals were partially met. Patient is being discharged due to not returning since the last visit.  ?????    Darrel Hoover, PT   11/26/14     10:59 AM

## 2014-10-07 NOTE — Patient Instructions (Signed)
  Copyright  VHI. All rights reserved.  Squat: Wide Leg   Copyright  VHI. All rights reserved.  Squat: Half   Arms hanging at sides, squat by dropping hips back as if sitting on a chair. Keep knees over ankles, hips behind heels.  Keep back straight.  Bend at hips and knees will bend with hips.   Repeat ___3-15_ times per set. Do __1-2__ sets per session. Do __2-5__ sessions per week. Use __0-10HIP / KNEE: Flexion / Extension, Squat Unilateral Hip / Glute Extension: Standing - Straight Leg (Machine) Hip Extension (Standing) Healthy Back - Yoga Tree Balance

## 2015-05-05 DIAGNOSIS — M170 Bilateral primary osteoarthritis of knee: Secondary | ICD-10-CM | POA: Diagnosis not present

## 2015-05-13 DIAGNOSIS — M47816 Spondylosis without myelopathy or radiculopathy, lumbar region: Secondary | ICD-10-CM | POA: Diagnosis not present

## 2015-05-13 DIAGNOSIS — M5136 Other intervertebral disc degeneration, lumbar region: Secondary | ICD-10-CM | POA: Diagnosis not present

## 2015-05-13 DIAGNOSIS — M545 Low back pain: Secondary | ICD-10-CM | POA: Diagnosis not present

## 2015-05-13 DIAGNOSIS — M170 Bilateral primary osteoarthritis of knee: Secondary | ICD-10-CM | POA: Diagnosis not present

## 2015-05-13 DIAGNOSIS — M5416 Radiculopathy, lumbar region: Secondary | ICD-10-CM | POA: Diagnosis not present

## 2015-05-20 DIAGNOSIS — M170 Bilateral primary osteoarthritis of knee: Secondary | ICD-10-CM | POA: Diagnosis not present

## 2015-07-06 ENCOUNTER — Telehealth: Payer: Self-pay

## 2015-07-06 NOTE — Telephone Encounter (Signed)
I'm fine with that 

## 2015-07-06 NOTE — Telephone Encounter (Signed)
-----   Message from Emmaline Life, RN sent at 07/06/2015  9:36 AM EDT ----- Patient was with her sister at an appointment today and requests to transfer care to Dr. Johnsie Cancel because he treats her entire family.  Please reply all to message giving consent and patient will need 6 mo f/u on the same day as her sister, Juline Patch. Newman DOB 08/11[birth year].  Thanks, Sharyn Lull

## 2015-07-08 NOTE — Telephone Encounter (Signed)
Dr. Johnsie Cancel initiated this transfer because the patient was in the office with her sister at an appointment on Monday June 19 and asked him if he would accept her as a patient.  He verbally agreed and asked me to send a note to Dr. Radford Pax.

## 2015-08-03 DIAGNOSIS — N183 Chronic kidney disease, stage 3 (moderate): Secondary | ICD-10-CM | POA: Diagnosis not present

## 2015-08-03 DIAGNOSIS — R269 Unspecified abnormalities of gait and mobility: Secondary | ICD-10-CM | POA: Diagnosis not present

## 2015-08-03 DIAGNOSIS — I129 Hypertensive chronic kidney disease with stage 1 through stage 4 chronic kidney disease, or unspecified chronic kidney disease: Secondary | ICD-10-CM | POA: Diagnosis not present

## 2015-08-03 DIAGNOSIS — R42 Dizziness and giddiness: Secondary | ICD-10-CM | POA: Diagnosis not present

## 2015-08-17 DIAGNOSIS — Z1389 Encounter for screening for other disorder: Secondary | ICD-10-CM | POA: Diagnosis not present

## 2015-08-17 DIAGNOSIS — Z0000 Encounter for general adult medical examination without abnormal findings: Secondary | ICD-10-CM | POA: Diagnosis not present

## 2015-08-17 DIAGNOSIS — M810 Age-related osteoporosis without current pathological fracture: Secondary | ICD-10-CM | POA: Diagnosis not present

## 2015-08-17 DIAGNOSIS — K219 Gastro-esophageal reflux disease without esophagitis: Secondary | ICD-10-CM | POA: Diagnosis not present

## 2015-08-17 DIAGNOSIS — I129 Hypertensive chronic kidney disease with stage 1 through stage 4 chronic kidney disease, or unspecified chronic kidney disease: Secondary | ICD-10-CM | POA: Diagnosis not present

## 2015-08-17 DIAGNOSIS — Z79899 Other long term (current) drug therapy: Secondary | ICD-10-CM | POA: Diagnosis not present

## 2015-08-17 DIAGNOSIS — N183 Chronic kidney disease, stage 3 (moderate): Secondary | ICD-10-CM | POA: Diagnosis not present

## 2015-08-17 DIAGNOSIS — E039 Hypothyroidism, unspecified: Secondary | ICD-10-CM | POA: Diagnosis not present

## 2015-09-03 DIAGNOSIS — M170 Bilateral primary osteoarthritis of knee: Secondary | ICD-10-CM | POA: Diagnosis not present

## 2015-09-23 DIAGNOSIS — M5416 Radiculopathy, lumbar region: Secondary | ICD-10-CM | POA: Diagnosis not present

## 2015-09-23 DIAGNOSIS — M47816 Spondylosis without myelopathy or radiculopathy, lumbar region: Secondary | ICD-10-CM | POA: Diagnosis not present

## 2016-02-04 NOTE — Progress Notes (Deleted)
Cardiology Office Note   Date:  02/04/2016   ID:  [name], DOB 09-23-1931, MRN [id number]  PCP:  Mathews Argyle, MD    No chief complaint on file.     History of Present Illness: [name] is an 83 y.o.  female with a history of ASCAD s/p CABG,2003  HTN, normal LVF, dyslipidemia and mild AS who presents today for followup. She is doing well. She denies any chest pain, SOB, DOE, LE edema, dizziness, palpitations or syncope. Previously seen by Dr Radford Pax but I take care of multiple family members  Echo 07/01/14 normal EF mild AS/AR mean gradient 9 peak 17 mmHg  Intolerant to statins and history of bradycardia   Past Medical History:  Diagnosis Date  . Anemia   . Aortic stenosis    mild AS by 02/18/11  [name]  . Arthritis   . Bradycardia    Asymtomatic resolved  . Cancer    ceervical  . Chronic kidney disease    renal insufficency, stage III  . Chronic renal disease, stage III   . Coronary artery disease    CABG 2003  . Dyslipidemia    goal LSL less then 70 but statin intolerant  . GERD (gastroesophageal reflux disease)   . Hypertension   . Hypothyroidism   . Iron deficiency anemia 7/12  . Mild AI (aortic insufficiency)   . Mild aortic stenosis   . OP (osteoporosis) 10/2010   fosamax  . Statin intolerance     Past Surgical History:  Procedure Laterality Date  . ABDOMINAL HYSTERECTOMY    . CARDIAC CATHETERIZATION     2004  . cataracts    . COLONOSCOPY  09/2010   normal EGD abd COLONOSCOPY  . CORONARY ARTERY BYPASS GRAFT  2004   with LIMA to LAD, SVG to diagonal, SVG to OM, SOVG to PDA normal LVF  . LUMBAR LAMINECTOMY  09/23/2011   Procedure: MICRODISCECTOMY LUMBAR LAMINECTOMY;  Surgeon: Marybelle Killings, MD;  Location: [place];  Service: Orthopedics;  Laterality: N/A;  L4-5 Decompression     Current Outpatient Prescriptions  Medication Sig Dispense Refill  . alendronate (FOSAMAX) 70 MG tablet Take 70 mg by mouth every 7  (seven) days. Take on Mondays. Take with a full glass of water on an empty stomach.    [name] amLODipine (NORVASC) 5 MG tablet Take 5 mg by mouth at bedtime.    [name] aspirin EC 81 MG tablet Take 81 mg by mouth at bedtime.    . calcium gluconate 500 MG tablet Take 1 tablet by mouth daily.    . carvedilol (COREG) 12.5 MG tablet Take 1 tablet (12.5 mg total) by mouth 2 (two) times daily with a meal. 180 tablet 2  . ferrous sulfate 325 (65 FE) MG tablet Take 325 mg by mouth at bedtime.    . hydrochlorothiazide (MICROZIDE) 12.5 MG capsule Take 12.5 mg by mouth at bedtime.    [name] ibuprofen (ADVIL,MOTRIN) 200 MG tablet Take 200-400 mg by mouth every 6 (six) hours as needed. For pain    . levothyroxine (SYNTHROID, LEVOTHROID) 75 MCG tablet Take 75 mcg by mouth at bedtime.    . Multiple Vitamin (MULTIVITAMIN WITH MINERALS) TABS Take 1 tablet by mouth at bedtime.    . Vitamin D, Ergocalciferol, (DRISDOL) 50000 UNITS CAPS Take 50,000 Units by mouth every 7 (seven) days. Mondays     No  current facility-administered medications for this visit.     Allergies:   Aleve [naproxen sodium]; Excedrin extra strength [aspirin-acetaminophen-caffeine]; Statins; and Vytorin [ezetimibe-simvastatin]    Social History:  The patient  reports that she has never smoked. She does not have any smokeless tobacco history on file. She reports that she does not drink alcohol or use drugs.   Family History:  The patient's family history includes Asthma in her father; Heart Problems in her mother.    ROS:  Please see the history of present illness.   Otherwise, review of systems are positive for knee and back pain.   All other systems are reviewed and negative.    PHYSICAL EXAM: VS:  There were no vitals taken for this visit. , BMI There is no height or weight on file to calculate BMI. GEN: Well nourished, well developed, in no acute distress  HEENT: normal  Neck: no JVD, carotid bruits, or masses Cardiac: RRR; AS  murmurs, rubs, or  gallops,no edema  Post sternotomy  Respiratory:  clear to auscultation bilaterally, normal work of breathing GI: soft, nontender, nondistended, + BS MS: no deformity or atrophy  Skin: warm and dry, no rash Neuro:  Strength and sensation are intact Psych: euthymic mood, full affect   EKG:  EKG was ordered today and showed NSR with no ST changes   Recent Labs: No results found for requested labs within last 8760 hours.    Lipid Panel    Component Value Date/Time   CHOL 208 (H) 03/24/2014 1008   TRIG 129.0 03/24/2014 1008   HDL 44.80 03/24/2014 1008   CHOLHDL 5 03/24/2014 1008   VLDL 25.8 03/24/2014 1008   LDLCALC 137 (H) 03/24/2014 1008      Wt Readings from Last 3 Encounters:  06/19/14 128 lb 1.9 oz (58.1 kg)  04/23/13 138 lb (62.6 kg)  09/13/11 138 lb 14.2 oz (63 kg)    ASSESSMENT AND PLAN:  1. ASCAD s/p CABG 2003 with no angina - continue ASA 2. HTN well controlled - continue amlodipine/Carvedilol/HCTZ.  Check BMET 3. Dyslipidemia - LDL not at goal (128 on 02/2013) - she stopped taking her zetia - recheck FLP and ALT 4. Mild AS - repeat echo    Current medicines are reviewed at length with the patient today.  The patient does not have concerns regarding medicines.  The following changes have been made:  no change  Labs/ tests ordered today: See above Assessment and Plan No orders of the defined types were placed in this encounter.    Disposition:   FU with me in 1 year  Signed, Jenkins Rouge, MD  02/04/2016 9:49 AM    [facility] [place], [place], [place]  [address] Phone: [phone]; Fax: [phone]

## 2016-02-05 ENCOUNTER — Ambulatory Visit: Payer: Medicare Other | Admitting: Cardiovascular Disease

## 2016-04-04 NOTE — Progress Notes (Signed)
Cardiology Office Note   Date:  04/07/2016   ID:  JODELLE FAUSTO, DOB 01-19-1931, MRN [id number]  PCP:  Mathews Argyle, MD    No chief complaint on file.     History of Present Illness: [name] is an 83 y.o. [name] female with a history of ASCAD s/p CABG, HTN, normal LVF, dyslipidemia and mild AS who presents today for followup. She is doing well. She denies any chest pain, SOB, DOE, LE edema, dizziness, palpitations or syncope. New to me previously seen by Dr Radford Pax.   CABG 2004 with LIMA to LAD SVG to Diagonal SVG OM and SVG PDA Last echo 07/01/14 personally reviewed mild AS/AR mean gradient 9 mmHg peak 17 mmHg  No chest pain middle son lives with her and helps out I took care of her 2nd husband [name]   Past Medical History:  Diagnosis Date  . Anemia   . Aortic stenosis    mild AS by 02/18/11  [name]  . Arthritis   . Bradycardia    Asymtomatic resolved  . Cancer    ceervical  . Chronic kidney disease    renal insufficency, stage III  . Chronic renal disease, stage III   . Coronary artery disease    CABG 2003  . Dyslipidemia    goal LSL less then 70 but statin intolerant  . GERD (gastroesophageal reflux disease)   . Hypertension   . Hypothyroidism   . Iron deficiency anemia 7/12  . Mild AI (aortic insufficiency)   . Mild aortic stenosis   . OP (osteoporosis) 10/2010   fosamax  . Statin intolerance     Past Surgical History:  Procedure Laterality Date  . ABDOMINAL HYSTERECTOMY    . CARDIAC CATHETERIZATION     2004  . cataracts    . COLONOSCOPY  09/2010   normal EGD abd COLONOSCOPY  . CORONARY ARTERY BYPASS GRAFT  2004   with LIMA to LAD, SVG to diagonal, SVG to OM, SOVG to PDA normal LVF  . LUMBAR LAMINECTOMY  09/23/2011   Procedure: MICRODISCECTOMY LUMBAR LAMINECTOMY;  Surgeon: Marybelle Killings, MD;  Location: [place];  Service: Orthopedics;  Laterality: N/A;  L4-5 Decompression     Current Outpatient Prescriptions    Medication Sig Dispense Refill  . alendronate (FOSAMAX) 70 MG tablet Take 70 mg by mouth every 7 (seven) days. Take on Mondays. Take with a full glass of water on an empty stomach.    [name] amLODipine (NORVASC) 5 MG tablet Take 5 mg by mouth at bedtime.    [name] aspirin EC 81 MG tablet Take 81 mg by mouth at bedtime.    . calcium gluconate 500 MG tablet Take 1 tablet by mouth daily.    . carvedilol (COREG) 12.5 MG tablet Take 1 tablet (12.5 mg total) by mouth 2 (two) times daily with a meal. 180 tablet 2  . ezetimibe (ZETIA) 10 MG tablet Take 10 mg by mouth daily.    . ferrous sulfate 325 (65 FE) MG tablet Take 325 mg by mouth at bedtime.    . hydrochlorothiazide (MICROZIDE) 12.5 MG capsule Take 12.5 mg by mouth at bedtime.    [name] ibuprofen (ADVIL,MOTRIN) 200 MG tablet Take 200-400 mg by mouth every 6 (six) hours as needed. For pain    . levothyroxine (SYNTHROID, LEVOTHROID) 75 MCG tablet Take 75 mcg by mouth at bedtime.    [name]  Multiple Vitamin (MULTIVITAMIN WITH MINERALS) TABS Take 1 tablet by mouth at bedtime.    . Vitamin D, Ergocalciferol, (DRISDOL) 50000 UNITS CAPS Take 50,000 Units by mouth every 7 (seven) days. Mondays     No current facility-administered medications for this visit.     Allergies:   Aleve [naproxen sodium]; Excedrin extra strength [aspirin-acetaminophen-caffeine]; Statins; and Vytorin [ezetimibe-simvastatin]    Social History:  The patient  reports that she has never smoked. She does not have any smokeless tobacco history on file. She reports that she does not drink alcohol or use drugs.   Family History:  The patient's family history includes Asthma in her father; Heart Problems in her mother.    ROS:  Please see the history of present illness.   Otherwise, review of systems are positive for knee and back pain.   All other systems are reviewed and negative.    PHYSICAL EXAM: BP 122/70   Pulse (!) 59   Ht 5' (1.524 m)   Wt 106 lb 8 oz (48.3 kg)   SpO2 94%   BMI 20.80  kg/m  Affect appropriate Healthy:  appears stated age 85: normal Neck supple with no adenopathy JVP normal no bruits no thyromegaly Lungs clear with no wheezing and good diaphragmatic motion Heart:  S1/S2 mild AS/AR  murmurs, no rub, gallop or click PMI normal Abdomen: benighn, BS positve, no tenderness, no AAA no bruit.  No HSM or HJR Distal pulses intact with no bruits No edema Neuro non-focal Skin warm and dry No muscular weakness    EKG:   06/19/14 NSR with no ST changes  04/07/16  SR rate 59 normal    Recent Labs: No results found for requested labs within last 8760 hours.    Lipid Panel    Component Value Date/Time   CHOL 208 (H) 03/24/2014 1008   TRIG 129.0 03/24/2014 1008   HDL 44.80 03/24/2014 1008   CHOLHDL 5 03/24/2014 1008   VLDL 25.8 03/24/2014 1008   LDLCALC 137 (H) 03/24/2014 1008      Wt Readings from Last 3 Encounters:  04/07/16 106 lb 8 oz (48.3 kg)  06/19/14 128 lb 1.9 oz (58.1 kg)  04/23/13 138 lb (62.6 kg)    ASSESSMENT AND PLAN:  1. AS mild on exam no need for echo at this time  2. CAD/CABG stable no chest pain continue medical Rx 3. HTN: Well controlled.  Continue current medications and low sodium Dash type diet.   4. Cholesterol  Labs with Dr Felipa Eth continue zetia intolerant to statins   Current medicines are reviewed at length with the patient today.  The patient does not have concerns regarding medicines.  The following changes have been made:  no change  Labs/ tests ordered today: See above Assessment and Plan  Orders Placed This Encounter  Procedures  . EKG 12-Lead     Disposition:   FU with me in 1 year  Signed, Jenkins Rouge, MD  04/07/2016 8:14 AM    [facility] [place], [place], [place]  [address] Phone: [phone]; Fax: [phone]

## 2016-04-07 ENCOUNTER — Encounter (INDEPENDENT_AMBULATORY_CARE_PROVIDER_SITE_OTHER): Payer: Self-pay

## 2016-04-07 ENCOUNTER — Ambulatory Visit (INDEPENDENT_AMBULATORY_CARE_PROVIDER_SITE_OTHER): Payer: Medicare Other | Admitting: Cardiovascular Disease

## 2016-04-07 VITALS — BP 122/70 | HR 59 | Ht 60.0 in | Wt 106.5 lb

## 2016-04-07 DIAGNOSIS — I2510 Atherosclerotic heart disease of native coronary artery without angina pectoris: Secondary | ICD-10-CM

## 2016-04-07 NOTE — Patient Instructions (Signed)
Medication Instructions:  Your physician recommends that you continue on your current medications as directed. Please refer to the Current Medication list given to you today.   Labwork: NONE ORDERED  Testing/Procedures: NONE ORDERED   Follow-Up: Your physician wants you to follow-up in: 1 YEAR WITH DR. NISHAN You will receive a reminder letter in the mail two months in advance. If you don't receive a letter, please call our office to schedule the follow-up appointment.   Any Other Special Instructions Will Be Listed Below (If Applicable).     If you need a refill on your cardiac medications before your next appointment, please call your pharmacy.   

## 2016-04-21 ENCOUNTER — Other Ambulatory Visit: Payer: Self-pay | Admitting: *Deleted

## 2016-04-21 MED ORDER — CARVEDILOL 12.5 MG PO TABS: 12.5000 mg | ORAL_TABLET | Freq: Two times a day (BID) | ORAL | 3 refills | Status: DC

## 2016-06-09 DIAGNOSIS — M1711 Unilateral primary osteoarthritis, right knee: Secondary | ICD-10-CM | POA: Diagnosis not present

## 2016-09-01 DIAGNOSIS — E039 Hypothyroidism, unspecified: Secondary | ICD-10-CM | POA: Diagnosis not present

## 2016-09-01 DIAGNOSIS — N183 Chronic kidney disease, stage 3 (moderate): Secondary | ICD-10-CM | POA: Diagnosis not present

## 2016-09-01 DIAGNOSIS — I129 Hypertensive chronic kidney disease with stage 1 through stage 4 chronic kidney disease, or unspecified chronic kidney disease: Secondary | ICD-10-CM | POA: Diagnosis not present

## 2016-09-01 DIAGNOSIS — M810 Age-related osteoporosis without current pathological fracture: Secondary | ICD-10-CM | POA: Diagnosis not present

## 2017-01-19 DIAGNOSIS — M1711 Unilateral primary osteoarthritis, right knee: Secondary | ICD-10-CM | POA: Diagnosis not present

## 2017-01-23 ENCOUNTER — Telehealth: Payer: Self-pay

## 2017-01-23 NOTE — Telephone Encounter (Signed)
Called pt re: surgical clearance. Left a detailed message that she will need to make an appt before she can be cleared, and to cal back to get that scheduled.  Will notify surgeons office as well, let Judeen Hammans a detailed voicemail.

## 2017-01-23 NOTE — Telephone Encounter (Signed)
   [facility] Pre-operative Risk Assessment    Request for surgical clearance:  1. What type of surgery is being performed? Right Total Knee Replacement    2. When is this surgery scheduled? 02/27/2017    3. Are there any medications that need to be held prior to surgery and how long? Aspirin   4. Practice name and name of physician performing surgery? [facility]- Dr. Elsie Saas  5. What is your office phone and fax number?  Phone [phone] Ext [phone] Fax [phone] Attention Sherri   6. Anesthesia type (None, local, MAC, general) ? unknown   Michaelyn Barter 01/23/2017, 2:59 PM  _________________________________________________________________  Please send most recent notes, labs, EKG, or special studies back with this clearance.

## 2017-01-23 NOTE — Telephone Encounter (Signed)
   Primary Cardiologist:Peter Johnsie Cancel, MD  Chart reviewed as part of pre-operative protocol coverage. H/o CAD s/p CABG, HTN, dyslipidemia, aortic stenosis (last echo in 2016).  Because of [name]'s past medical history and time since last visit, in order to ensure her safety prior to surgery, she will require a follow-up visit in order to better assess preoperative cardiovascular risk. May need an updated echo given prior history of aortic stensosis depending on what that visit shows or how she is felt to be doing.  Pre-op covering staff: - Please schedule appointment and call patient to inform them. - Please contact requesting surgeon's office via preferred method (i.e, phone, fax) to inform them of need for appointment prior to surgery.  Charlie Pitter, PA-C  01/23/2017, 3:26 PM

## 2017-01-24 NOTE — Telephone Encounter (Signed)
Left message asking patient to call back to discuss surgical clearance.

## 2017-01-25 NOTE — Progress Notes (Signed)
Cardiology Office Note   Date:  01/27/2017   ID:  [name], DOB 07-Jan-1932, MRN [id number]  PCP:  Lajean Manes, MD    No chief complaint on file.     History of Present Illness: [name] is an 84 y.o. [name] female with a history of ASCAD s/p CABG, HTN, normal LVF, dyslipidemia and mild AS who was added on to schedule today for preoperative clearance . She needs a right TKR with Dr Noemi Chapel    CABG 2004 with LIMA to LAD SVG to Diagonal SVG OM and SVG PDA Last echo 07/01/14 personally reviewed mild AS/AR mean gradient 9 mmHg peak 17 mmHg  She can achieve 4 METS with no dyspnea or chest pain. No palpitations syncope Mild exertional  Dyspnea. Right knee limits her more than anything else   I took care of her 2nd husband [name]   Past Medical History:  Diagnosis Date  . Anemia   . Aortic stenosis    mild AS by 02/18/11  [name])  . Arthritis   . Bradycardia    Asymtomatic resolved  . Cancer (HCC)    ceervical  . Chronic kidney disease    renal insufficency, stage III  . Chronic renal disease, stage III ([place])   . Coronary artery disease    CABG 2003  . Dyslipidemia    goal LSL less then 70 but statin intolerant  . GERD (gastroesophageal reflux disease)   . Hypertension   . Hypothyroidism   . Iron deficiency anemia 7/12  . Mild AI (aortic insufficiency)   . Mild aortic stenosis   . OP (osteoporosis) 10/2010   fosamax  . Statin intolerance     Past Surgical History:  Procedure Laterality Date  . ABDOMINAL HYSTERECTOMY    . CARDIAC CATHETERIZATION     2004  . cataracts    . COLONOSCOPY  09/2010   normal EGD abd COLONOSCOPY  . CORONARY ARTERY BYPASS GRAFT  2004   with LIMA to LAD, SVG to diagonal, SVG to OM, SOVG to PDA normal LVF  . LUMBAR LAMINECTOMY  09/23/2011   Procedure: MICRODISCECTOMY LUMBAR LAMINECTOMY;  Surgeon: Joann Killings, MD;  Location: [place];  Service: Orthopedics;  Laterality: N/A;  L4-5 Decompression     Current  Outpatient Medications  Medication Sig Dispense Refill  . aspirin EC 81 MG tablet Take 81 mg by mouth at bedtime.    . calcium gluconate 500 MG tablet Take 1 tablet by mouth daily.    . carvedilol (COREG) 12.5 MG tablet Take 1 tablet (12.5 mg total) by mouth 2 (two) times daily with a meal. 180 tablet 3  . ezetimibe (ZETIA) 10 MG tablet Take 10 mg by mouth daily.    . ferrous sulfate 325 (65 FE) MG tablet Take 325 mg by mouth at bedtime.    . hydrochlorothiazide (MICROZIDE) 12.5 MG capsule Take 12.5 mg by mouth at bedtime.    [name] ibuprofen (ADVIL,MOTRIN) 200 MG tablet Take 200-400 mg by mouth every 6 (six) hours as needed. For pain    . levothyroxine (SYNTHROID, LEVOTHROID) 75 MCG tablet Take 75 mcg by mouth at bedtime.    [name] losartan (COZAAR) 25 MG tablet Take 1 tablet by mouth daily.    . Multiple Vitamin (MULTIVITAMIN WITH MINERALS) TABS Take 1 tablet by mouth at bedtime.    . Vitamin D, Ergocalciferol, (DRISDOL) 50000 UNITS CAPS Take  50,000 Units by mouth every 7 (seven) days. Mondays     No current facility-administered medications for this visit.     Allergies:   Aleve [naproxen sodium]; Excedrin extra strength [aspirin-acetaminophen-caffeine]; Statins; and Vytorin [ezetimibe-simvastatin]    Social History:  The patient  reports that  has never smoked. she has never used smokeless tobacco. She reports that she does not drink alcohol or use drugs.   Family History:  The patient's family history includes Asthma in her father; Heart Problems in her mother.    ROS:  Please see the history of present illness.   Otherwise, review of systems are positive for knee and back pain.   All other systems are reviewed and negative.    PHYSICAL EXAM: BP (!) 154/86   Pulse 62   Ht 4\' 11"  (1.499 m)   Wt 107 lb (48.5 kg)   BMI 21.61 kg/m  Affect appropriate Elderly white female  HEENT: normal Neck supple with no adenopathy JVP normal no bruits no thyromegaly Lungs clear with no wheezing and  good diaphragmatic motion Heart:  S1/S2 preserved AS murmur, no rub, gallop or click PMI normal Abdomen: benighn, BS positve, no tenderness, no AAA no bruit.  No HSM or HJR Distal pulses intact with no bruits No edema Neuro non-focal Skin warm and dry No muscular weakness    EKG:   06/19/14 NSR with no ST changes  04/07/16  SR rate 59 normal    Recent Labs: No results found for requested labs within last 8760 hours.    Lipid Panel    Component Value Date/Time   CHOL 208 (H) 03/24/2014 1008   TRIG 129.0 03/24/2014 1008   HDL 44.80 03/24/2014 1008   CHOLHDL 5 03/24/2014 1008   VLDL 25.8 03/24/2014 1008   LDLCALC 137 (H) 03/24/2014 1008      Wt Readings from Last 3 Encounters:  01/27/17 107 lb (48.5 kg)  04/07/16 106 lb 8 oz (48.3 kg)  06/19/14 128 lb 1.9 oz (58.1 kg)    ASSESSMENT AND PLAN:  1. AS mild by echo 2016 sounds more moderate on exam will update echo  2. CAD/CABG stable no angina with activity do not think she needs stress testing before surgery  3. HTN: Well controlled.  Continue current medications and low sodium Dash type diet.   4. Cholesterol  Labs with Dr Felipa Eth continue zetia intolerant to statins 5. Pre operative assessment has left carotid bruit vs transmitted AS murmur  Will order echo for AS and carotid for bruit and if ok will clear for surgery   Current medicines are reviewed at length with the patient today.  The patient does not have concerns regarding medicines.  The following changes have been made:  no change  Labs/ tests ordered today: See above Assessment and Plan  No orders of the defined types were placed in this encounter.    Disposition:   FU with me in 1 year  Signed, Jenkins Rouge, MD  01/27/2017 3:15 PM    [facility] [address], [place], [place]  [address] Phone: [phone]; Fax: [phone]

## 2017-01-27 ENCOUNTER — Ambulatory Visit (INDEPENDENT_AMBULATORY_CARE_PROVIDER_SITE_OTHER): Payer: Medicare Other | Admitting: Cardiovascular Disease

## 2017-01-27 ENCOUNTER — Encounter: Payer: Self-pay | Admitting: Cardiovascular Disease

## 2017-01-27 VITALS — BP 154/86 | HR 62 | Ht 59.0 in | Wt 107.0 lb

## 2017-01-27 DIAGNOSIS — R0989 Other specified symptoms and signs involving the circulatory and respiratory systems: Secondary | ICD-10-CM | POA: Diagnosis not present

## 2017-01-27 DIAGNOSIS — I350 Nonrheumatic aortic (valve) stenosis: Secondary | ICD-10-CM | POA: Diagnosis not present

## 2017-01-27 NOTE — Patient Instructions (Addendum)
Medication Instructions:  Your physician recommends that you continue on your current medications as directed. Please refer to the Current Medication list given to you today.  Labwork: NONE  Testing/Procedures: Your physician has requested that you have an echocardiogram next week. Echocardiography is a painless test that uses sound waves to create images of your heart. It provides your doctor with information about the size and shape of your heart and how well your heart's chambers and valves are working. This procedure takes approximately one hour. There are no restrictions for this procedure.  Your physician has requested that you have a carotid duplex next week. This test is an ultrasound of the carotid arteries in your neck. It looks at blood flow through these arteries that supply the brain with blood. Allow one hour for this exam. There are no restrictions or special instructions.  Follow-Up: Your physician wants you to follow-up in: 12 months with Dr. Johnsie Cancel. You will receive a reminder letter in the mail two months in advance. If you don't receive a letter, please call our office to schedule the follow-up appointment.   If you need a refill on your cardiac medications before your next appointment, please call your pharmacy.

## 2017-01-30 ENCOUNTER — Ambulatory Visit (HOSPITAL_BASED_OUTPATIENT_CLINIC_OR_DEPARTMENT_OTHER): Payer: Medicare Other

## 2017-01-30 ENCOUNTER — Other Ambulatory Visit: Payer: Self-pay

## 2017-01-30 ENCOUNTER — Ambulatory Visit (HOSPITAL_COMMUNITY)
Admission: RE | Admit: 2017-01-30 | Discharge: 2017-01-30 | Disposition: A | Discharge status: Home / Self Care | Payer: Medicare Other | Source: Ambulatory Visit | Attending: Internal Medicine | Admitting: Internal Medicine

## 2017-01-30 ENCOUNTER — Telehealth: Payer: Self-pay | Admitting: Cardiovascular Disease

## 2017-01-30 DIAGNOSIS — I081 Rheumatic disorders of both mitral and tricuspid valves: Secondary | ICD-10-CM | POA: Diagnosis not present

## 2017-01-30 DIAGNOSIS — R0989 Other specified symptoms and signs involving the circulatory and respiratory systems: Secondary | ICD-10-CM | POA: Diagnosis not present

## 2017-01-30 DIAGNOSIS — I350 Nonrheumatic aortic (valve) stenosis: Secondary | ICD-10-CM

## 2017-01-30 NOTE — Telephone Encounter (Signed)
Patient aware of echo results. Per Dr. Johnsie Cancel, Low normal EF moderate AS f/u echo in a year Ok to have knee surgery. A copy of this echo has already been sent to Dr. Noemi Chapel. Will send copy to patient's PCP as well.

## 2017-01-30 NOTE — Addendum Note (Signed)
Addended by: Aris Georgia, Ewa Hipp L on: 01/30/2017 05:16 PM   Modules accepted: Orders

## 2017-01-30 NOTE — Telephone Encounter (Signed)
New Message    Patient is returning call that she received today. Please call.

## 2017-01-31 NOTE — Telephone Encounter (Signed)
Notes recorded by [name], MD on 01/30/2017 at 4:10 PM EST Low normal EF moderate AS f/u echo in a year Ok to have knee surgery

## 2017-02-07 DIAGNOSIS — N183 Chronic kidney disease, stage 3 (moderate): Secondary | ICD-10-CM | POA: Diagnosis not present

## 2017-02-07 DIAGNOSIS — E039 Hypothyroidism, unspecified: Secondary | ICD-10-CM | POA: Diagnosis not present

## 2017-02-07 DIAGNOSIS — I129 Hypertensive chronic kidney disease with stage 1 through stage 4 chronic kidney disease, or unspecified chronic kidney disease: Secondary | ICD-10-CM | POA: Diagnosis not present

## 2017-02-07 DIAGNOSIS — Z79899 Other long term (current) drug therapy: Secondary | ICD-10-CM | POA: Diagnosis not present

## 2017-02-16 ENCOUNTER — Inpatient Hospital Stay (HOSPITAL_COMMUNITY): Admission: RE | Admit: 2017-02-16 | Payer: Medicare Other | Source: Ambulatory Visit

## 2017-02-20 DIAGNOSIS — I129 Hypertensive chronic kidney disease with stage 1 through stage 4 chronic kidney disease, or unspecified chronic kidney disease: Secondary | ICD-10-CM | POA: Diagnosis not present

## 2017-02-20 DIAGNOSIS — N183 Chronic kidney disease, stage 3 (moderate): Secondary | ICD-10-CM | POA: Diagnosis not present

## 2017-02-27 ENCOUNTER — Inpatient Hospital Stay (HOSPITAL_COMMUNITY): Admission: RE | Admit: 2017-02-27 | Payer: Medicare Other | Source: Ambulatory Visit | Admitting: Orthopedic Surgery

## 2017-02-27 ENCOUNTER — Encounter (HOSPITAL_COMMUNITY): Admission: RE | Payer: Self-pay | Source: Ambulatory Visit

## 2017-02-27 SURGERY — ARTHROPLASTY, KNEE, TOTAL
Anesthesia: Spinal | Laterality: Right

## 2017-10-27 ENCOUNTER — Encounter (HOSPITAL_COMMUNITY): Payer: Self-pay | Admitting: Cardiovascular Disease

## 2018-02-02 ENCOUNTER — Telehealth: Payer: Self-pay | Admitting: Cardiovascular Disease

## 2018-02-02 NOTE — Telephone Encounter (Signed)
  Patient wants to know if she can move her echo to April and her annual appt time so that she can come the same day that her sister comes to see Dr Johnsie Cancel. Her sister has an appt on April 30, 2018 and I scheduled [name] right after her sister on that day. Pt is overdue for her 1 yr f/u so I made her aware that Dr Johnsie Cancel may want her to get her echo and be seen in January but that I would verify if her echo can be moved back and her coming in April is okay.

## 2018-02-02 NOTE — Telephone Encounter (Signed)
That's fine

## 2018-02-02 NOTE — Telephone Encounter (Signed)
Called patient and made her echo appointment on the same day as her office visit in April.

## 2018-02-06 ENCOUNTER — Other Ambulatory Visit (HOSPITAL_COMMUNITY): Payer: Medicare Other

## 2018-02-15 DIAGNOSIS — Z0000 Encounter for general adult medical examination without abnormal findings: Secondary | ICD-10-CM | POA: Diagnosis not present

## 2018-02-15 DIAGNOSIS — I129 Hypertensive chronic kidney disease with stage 1 through stage 4 chronic kidney disease, or unspecified chronic kidney disease: Secondary | ICD-10-CM | POA: Diagnosis not present

## 2018-02-15 DIAGNOSIS — N183 Chronic kidney disease, stage 3 (moderate): Secondary | ICD-10-CM | POA: Diagnosis not present

## 2018-02-15 DIAGNOSIS — E039 Hypothyroidism, unspecified: Secondary | ICD-10-CM | POA: Diagnosis not present

## 2018-04-17 DIAGNOSIS — N183 Chronic kidney disease, stage 3 (moderate): Secondary | ICD-10-CM | POA: Diagnosis not present

## 2018-04-17 DIAGNOSIS — I129 Hypertensive chronic kidney disease with stage 1 through stage 4 chronic kidney disease, or unspecified chronic kidney disease: Secondary | ICD-10-CM | POA: Diagnosis not present

## 2018-04-17 DIAGNOSIS — G301 Alzheimer's disease with late onset: Secondary | ICD-10-CM | POA: Diagnosis not present

## 2018-04-19 ENCOUNTER — Telehealth: Payer: Self-pay | Admitting: Cardiovascular Disease

## 2018-04-19 DIAGNOSIS — I350 Nonrheumatic aortic (valve) stenosis: Secondary | ICD-10-CM

## 2018-04-19 NOTE — Telephone Encounter (Signed)
Patient called and discussed cancelling elective echocardiogram due to concerns for Covid19 and need for social distancing . Patient stable with no urgent symptoms.  TTE scheduled for 4/13 can be rescheduled for 3-6 months  

## 2018-04-20 ENCOUNTER — Telehealth: Payer: Self-pay

## 2018-04-20 NOTE — Telephone Encounter (Signed)
Spoke with pt and she has agreed to have phone visit with Dr. Johnsie Cancel on 4/13 the same time as her sister [name]. She would like to be called on the same phone # as her sister [phone]. Pt verbalized understanding and thanked me for the call.

## 2018-04-20 NOTE — Telephone Encounter (Signed)
Virtual Visit Pre-Appointment Phone Call  Steps For Call:  1. Confirm consent - "In the setting of the current Covid19 crisis, you are scheduled for a (phone or video) visit with your provider on (date) at (time).  Just as we do with many in-office visits, in order for you to participate in this visit, we must obtain consent.  If you'd like, I can send this to your mychart (if signed up) or email for you to review.  Otherwise, I can obtain your verbal consent now.  All virtual visits are billed to your insurance company just like a normal visit would be.  By agreeing to a virtual visit, we'd like you to understand that the technology does not allow for your provider to perform an examination, and thus may limit your provider's ability to fully assess your condition.  Finally, though the technology is pretty good, we cannot assure that it will always work on either your or our end, and in the setting of a video visit, we may have to convert it to a phone-only visit.  In either situation, we cannot ensure that we have a secure connection.  Are you willing to proceed?"  2. Give patient instructions for WebEx download to smartphone as below if video visit  3. Advise patient to be prepared with any vital sign or heart rhythm information, their current medicines, and a piece of paper and pen handy for any instructions they may receive the day of their visit  4. Inform patient they will receive a phone call 15 minutes prior to their appointment time (may be from unknown caller ID) so they should be prepared to answer  5. Confirm that appointment type is correct in Epic appointment notes (video vs telephone)    TELEPHONE CALL NOTE  [name] has been deemed a candidate for a follow-up tele-health visit to limit community exposure during the Covid-19 pandemic. I spoke with the patient via phone to ensure availability of phone/video source, confirm preferred email & phone number, and discuss  instructions and expectations.  I reminded [name] to be prepared with any vital sign and/or heart rhythm information that could potentially be obtained via home monitoring, at the time of her visit. I reminded [name] to expect a phone call at the time of her visit if her visit.  Did the patient verbally acknowledge consent to treatment? Yes  Jacinta Shoe, [place] 04/20/2018 4:34 PM   DOWNLOADING THE [place], go to [employer] and type in WebEx in the search bar. [place] [employer], the blue/green circle. The app is free but as with any other app downloads, their phone may require them to verify saved payment information or Apple password. The patient does NOT have to create an account.  - If Android, ask patient to go to [employer] and type in WebEx in the search bar. [place] [employer], the blue/green circle. The app is free but as with any other app downloads, their phone may require them to verify saved payment information or Android password. The patient does NOT have to create an account.   CONSENT FOR TELE-HEALTH VISIT - PLEASE REVIEW  I hereby voluntarily request, consent and authorize [facility] and its employed or contracted physicians, physician assistants, nurse practitioners or other licensed health care professionals (the Practitioner), to provide me with telemedicine health care services (the "Services") as deemed necessary by the treating Practitioner.  I acknowledge and consent to receive the Services by the Practitioner via telemedicine. I understand that the telemedicine visit will involve communicating with the Practitioner through live audiovisual communication technology and the disclosure of certain medical information by electronic transmission. I acknowledge that I have been given the opportunity to request an in-person assessment or other available alternative prior to the telemedicine visit and  am voluntarily participating in the telemedicine visit.  I understand that I have the right to withhold or withdraw my consent to the use of telemedicine in the course of my care at any time, without affecting my right to future care or treatment, and that the Practitioner or I may terminate the telemedicine visit at any time. I understand that I have the right to inspect all information obtained and/or recorded in the course of the telemedicine visit and may receive copies of available information for a reasonable fee.  I understand that some of the potential risks of receiving the Services via telemedicine include:  [name] Delay or interruption in medical evaluation due to technological equipment failure or disruption; . Information transmitted may not be sufficient (e.g. poor resolution of images) to allow for appropriate medical decision making by the Practitioner; and/or  . In rare instances, security protocols could fail, causing a breach of personal health information.  Furthermore, I acknowledge that it is my responsibility to provide information about my medical history, conditions and care that is complete and accurate to the best of my ability. I acknowledge that Practitioner's advice, recommendations, and/or decision may be based on factors not within their control, such as incomplete or inaccurate data provided by me or distortions of diagnostic images or specimens that may result from electronic transmissions. I understand that the practice of medicine is not an exact science and that Practitioner makes no warranties or guarantees regarding treatment outcomes. I acknowledge that I will receive a copy of this consent concurrently upon execution via email to the email address I last provided but may also request a printed copy by calling the office of [place].    I understand that my insurance will be billed for this visit.   I have read or had this consent read to me. . I understand the  contents of this consent, which adequately explains the benefits and risks of the Services being provided via telemedicine.  . I have been provided ample opportunity to ask questions regarding this consent and the Services and have had my questions answered to my satisfaction. . I give my informed consent for the services to be provided through the use of telemedicine in my medical care  By participating in this telemedicine visit I agree to the above.

## 2018-04-24 NOTE — Telephone Encounter (Signed)
Reordered echo to allow scheduling to make patient an appointment.

## 2018-04-24 NOTE — Addendum Note (Signed)
Addended by: Aris Georgia, Elia Keenum L on: 04/24/2018 03:42 PM   Modules accepted: Orders

## 2018-04-26 NOTE — Progress Notes (Signed)
Virtual Visit via Video Note   This visit type was conducted due to national recommendations for restrictions regarding the COVID-19 Pandemic (e.g. social distancing) in an effort to limit this patient's exposure and mitigate transmission in our community.  Due to her co-morbid illnesses, this patient is at least at moderate risk for complications without adequate follow up.  This format is felt to be most appropriate for this patient at this time.  All issues noted in this document were discussed and addressed.  A limited physical exam was performed with this format.  Please refer to the patient's chart for her consent to telehealth for [facility].   Evaluation Performed:  Follow-up visit  Date:  04/26/2018   ID:  [name], DOB April 15, 1931, MRN [id number]  Patient Location: Home  Provider Location: Office  PCP:  Lajean Manes, MD  Cardiologist:  Jenkins Rouge, MD   Electrophysiologist:  None   Chief Complaint:  CAD/AS  History of Present Illness:    [name] is an 85 y.o. [name] female with a history of ASCAD s/p CABG, HTN, normal LVF, dyslipidemia and mild AS    CABG 2004 with LIMA to LAD SVG to Diagonal SVG OM and SVG PDA Last echo EF 50-55% mean gradient 25 mmHg peak 39 mmHg DVI .27   No palpitations syncope Mild exertional     I took care of her 2nd husband [name]  Sister [name] is a patient of mine as well Discussed need for f/u TTE for her AS   The patient does not have symptoms concerning for COVID-19 infection (fever, chills, cough, or new shortness of breath).    Past Medical History:  Diagnosis Date  . Anemia   . Aortic stenosis    mild AS by 02/18/11  [name]  . Arthritis   . Bradycardia    Asymtomatic resolved  . Cancer (HCC)    ceervical  . Chronic kidney disease    renal insufficency, stage III  . Chronic renal disease, stage III ([place])   . Coronary artery disease    CABG 2003  . Dyslipidemia    goal LSL less then 70 but statin  intolerant  . GERD (gastroesophageal reflux disease)   . Hypertension   . Hypothyroidism   . Iron deficiency anemia 7/12  . Mild AI (aortic insufficiency)   . Mild aortic stenosis   . OP (osteoporosis) 10/2010   fosamax  . Statin intolerance    Past Surgical History:  Procedure Laterality Date  . ABDOMINAL HYSTERECTOMY    . CARDIAC CATHETERIZATION     2004  . cataracts    . COLONOSCOPY  09/2010   normal EGD abd COLONOSCOPY  . CORONARY ARTERY BYPASS GRAFT  2004   with LIMA to LAD, SVG to diagonal, SVG to OM, SOVG to PDA normal LVF  . LUMBAR LAMINECTOMY  09/23/2011   Procedure: MICRODISCECTOMY LUMBAR LAMINECTOMY;  Surgeon: Marybelle Killings, MD;  Location: [place];  Service: Orthopedics;  Laterality: N/A;  L4-5 Decompression     No outpatient medications have been marked as taking for the 04/30/18 encounter (Appointment) with [name], MD.     Allergies:   Aleve [naproxen sodium]; Excedrin extra strength [aspirin-acetaminophen-caffeine]; Statins; and Vytorin [ezetimibe-simvastatin]   Social History   Tobacco Use  . Smoking status: Never Smoker  . Smokeless tobacco: Never Used  Substance Use Topics  . Alcohol use: No  . Drug use: No     Family Hx: The patient's  family history includes Asthma in her father; Heart Problems in her mother.  ROS:   Please see the history of present illness.     All other systems reviewed and are negative.   Prior CV studies:   The following studies were reviewed today: Echo 01/30/17   Labs/Other Tests and Data Reviewed:    EKG:   04/07/16 SR rate 58 normal   Recent Labs: No results found for requested labs within last 8760 hours.   Recent Lipid Panel Lab Results  Component Value Date/Time   CHOL 208 (H) 03/24/2014 10:08 AM   TRIG 129.0 03/24/2014 10:08 AM   HDL 44.80 03/24/2014 10:08 AM   CHOLHDL 5 03/24/2014 10:08 AM   LDLCALC 137 (H) 03/24/2014 10:08 AM    Wt Readings from Last 3 Encounters:  01/27/17 48.5 kg  04/07/16 48.3  kg  06/19/14 58.1 kg     Objective:    Vital Signs:  There were no vitals taken for this visit.   Well nourished, well developed female in no acute distress. Skin warm and dry No JVP elevation No tachypnea Known left carotid bruit and AS murmur No edema Post right TKR   ASSESSMENT & PLAN:    1. AS:  Needs f/u echo when COVID 19 restrictions lifted  2. CAD/CABG:  Distant no chest pain observe given age and lack of symptoms 3. HLD:  On zetia labs with primary 4. Thyroid:  On replacement labs with primary   COVID-19 Education: The signs and symptoms of COVID-19 were discussed with the patient and how to seek care for testing (follow up with PCP or arrange E-visit).  The importance of social distancing was discussed today.  Time:   Today, I have spent 30 minutes with the patient with telehealth technology discussing the above problems.     Medication Adjustments/Labs and Tests Ordered: Current medicines are reviewed at length with the patient today.  Concerns regarding medicines are outlined above.  Tests Ordered:  Echo for AS when COVID19 restrictions lifted  Medication Changes: No orders of the defined types were placed in this encounter.   Disposition:  Follow up f/u in a year if echo ok   Signed, Jenkins Rouge, MD  04/26/2018 5:49 PM    [place]

## 2018-04-26 NOTE — Telephone Encounter (Signed)
This encounter was created in error - please disregard.

## 2018-04-30 ENCOUNTER — Telehealth (INDEPENDENT_AMBULATORY_CARE_PROVIDER_SITE_OTHER): Payer: Medicare Other | Admitting: Cardiovascular Disease

## 2018-04-30 ENCOUNTER — Encounter: Payer: Self-pay | Admitting: Cardiovascular Disease

## 2018-04-30 ENCOUNTER — Other Ambulatory Visit (HOSPITAL_COMMUNITY): Payer: Medicare Other

## 2018-04-30 ENCOUNTER — Other Ambulatory Visit: Payer: Self-pay

## 2018-04-30 VITALS — BP 120/64 | HR 61 | Ht 60.0 in | Wt 98.0 lb

## 2018-04-30 DIAGNOSIS — I350 Nonrheumatic aortic (valve) stenosis: Secondary | ICD-10-CM

## 2018-04-30 NOTE — Patient Instructions (Addendum)
Medication Instructions:   If you need a refill on your cardiac medications before your next appointment, please call your pharmacy.   Lab work:  If you have labs (blood work) drawn today and your tests are completely normal, you will receive your results only by: . MyChart Message (if you have MyChart) OR . A paper copy in the mail If you have any lab test that is abnormal or we need to change your treatment, we will call you to review the results.  Testing/Procedures: None ordered today.  Follow-Up: At [facility], you and your health needs are our priority.  As part of our continuing mission to provide you with exceptional heart care, we have created designated Provider Care Teams.  These Care Teams include your primary Cardiologist (physician) and Advanced Practice Providers (APPs -  Physician Assistants and Nurse Practitioners) who all work together to provide you with the care you need, when you need it. You will need a follow up appointment in September.  Please call our office 2 months in advance to schedule this appointment.  You may see Peter Nishan, MD or one of the following Advanced Practice Providers on your designated Care Team:   Lori Gerhardt, NP Laura Ingold, NP . Jill McDaniel, NP    

## 2018-07-17 DIAGNOSIS — I129 Hypertensive chronic kidney disease with stage 1 through stage 4 chronic kidney disease, or unspecified chronic kidney disease: Secondary | ICD-10-CM | POA: Diagnosis not present

## 2018-07-17 DIAGNOSIS — G301 Alzheimer's disease with late onset: Secondary | ICD-10-CM | POA: Diagnosis not present

## 2018-07-17 DIAGNOSIS — N183 Chronic kidney disease, stage 3 (moderate): Secondary | ICD-10-CM | POA: Diagnosis not present

## 2018-09-14 DIAGNOSIS — I129 Hypertensive chronic kidney disease with stage 1 through stage 4 chronic kidney disease, or unspecified chronic kidney disease: Secondary | ICD-10-CM | POA: Diagnosis not present

## 2018-09-14 DIAGNOSIS — N183 Chronic kidney disease, stage 3 (moderate): Secondary | ICD-10-CM | POA: Diagnosis not present

## 2018-09-14 DIAGNOSIS — E039 Hypothyroidism, unspecified: Secondary | ICD-10-CM | POA: Diagnosis not present

## 2018-09-14 DIAGNOSIS — M810 Age-related osteoporosis without current pathological fracture: Secondary | ICD-10-CM | POA: Diagnosis not present

## 2018-09-25 NOTE — Progress Notes (Signed)
Evaluation Performed:  Follow-up visit  Date:  10/04/2018   ID:  [name], DOB Feb 09, 1931, MRN [id number]  Provider Location: Office  PCP:  Lajean Manes, MD  Cardiologist:  Jenkins Rouge, MD   Electrophysiologist:  None   Chief Complaint:  CAD/AS  History of Present Illness:    [name] is an 85 y.o. female with a history of ASCAD s/p CABG, HTN, normal LVF, dyslipidemia and mild AS    CABG 2004 with LIMA to LAD SVG to Diagonal SVG OM and SVG PDA Last echo EF 50-55% mean gradient 25 mmHg peak 39 mmHg DVI .27   No palpitations syncope or chest pain   I took care of her 2nd husband [name]  Sister [name] is a patient of mine as well Discussed need for f/u TTE for her AS  Echo 01/30/17 with EF 50-55% moderate AS mean gradient 25 mmHg peak 39 mmHg VTI DVI 0.29    She is going to have echo today after visit for f/u   The patient does not have symptoms concerning for COVID-19 infection (fever, chills, cough, or new shortness of breath).    Past Medical History:  Diagnosis Date  . Anemia   . Aortic stenosis    mild AS by 02/18/11  [name]  . Arthritis   . Bradycardia    Asymtomatic resolved  . Cancer (HCC)    ceervical  . Chronic kidney disease    renal insufficency, stage III  . Chronic renal disease, stage III ([place])   . Coronary artery disease    CABG 2003  . Dyslipidemia    goal LSL less then 70 but statin intolerant  . GERD (gastroesophageal reflux disease)   . Hypertension   . Hypothyroidism   . Iron deficiency anemia 7/12  . Mild AI (aortic insufficiency)   . Mild aortic stenosis   . OP (osteoporosis) 10/2010   fosamax  . Statin intolerance    Past Surgical History:  Procedure Laterality Date  . ABDOMINAL HYSTERECTOMY    . CARDIAC CATHETERIZATION     2004  . cataracts    . COLONOSCOPY  09/2010   normal EGD abd COLONOSCOPY  . CORONARY ARTERY BYPASS GRAFT  2004   with LIMA to LAD, SVG to diagonal, SVG to OM, SOVG to PDA normal  LVF  . LUMBAR LAMINECTOMY  09/23/2011   Procedure: MICRODISCECTOMY LUMBAR LAMINECTOMY;  Surgeon: Marybelle Killings, MD;  Location: [facility];  Service: Orthopedics;  Laterality: N/A;  L4-5 Decompression     Current Meds  Medication Sig  . aspirin EC 81 MG tablet Take 81 mg by mouth at bedtime.  . calcium gluconate 500 MG tablet Take 1 tablet by mouth at bedtime.   . carvedilol (COREG) 12.5 MG tablet Take 1 tablet (12.5 mg total) by mouth 2 (two) times daily with a meal.  . donepezil (ARICEPT) 10 MG tablet Take 5 mg by mouth at bedtime.  [name] ezetimibe (ZETIA) 10 MG tablet Take 10 mg by mouth at bedtime.   . ferrous sulfate 325 (65 FE) MG tablet Take 325 mg by mouth at bedtime.  . hydrochlorothiazide (MICROZIDE) 12.5 MG capsule Take 12.5 mg by mouth at bedtime.  [name] ibuprofen (ADVIL,MOTRIN) 200 MG tablet Take 200 mg by mouth every 6 (six) hours as needed for headache or moderate pain.   [name] levothyroxine (SYNTHROID, LEVOTHROID) 75 MCG tablet Take 75 mcg by mouth at bedtime.  [name] losartan (COZAAR) 25 MG tablet Take  25 mg by mouth at bedtime.   . Vitamin D, Ergocalciferol, (DRISDOL) 50000 UNITS CAPS Take 50,000 Units by mouth every Monday.      Allergies:   Aleve [naproxen sodium], Excedrin extra strength [aspirin-acetaminophen-caffeine], Statins, and Vytorin [ezetimibe-simvastatin]   Social History   Tobacco Use  . Smoking status: Never Smoker  . Smokeless tobacco: Never Used  Substance Use Topics  . Alcohol use: No  . Drug use: No     Family Hx: The patient's family history includes Asthma in her father; Heart Problems in her mother.  ROS:   Please see the history of present illness.     All other systems reviewed and are negative.   Prior CV studies:   The following studies were reviewed today: Echo 01/30/17  Echo 10/04/18   Labs/Other Tests and Data Reviewed:    EKG:   04/07/16 SR rate 58 normal SR read as flutter but artifact rate 70 normal   Recent Labs: No results found for requested  labs within last 8760 hours.   Recent Lipid Panel Lab Results  Component Value Date/Time   CHOL 208 (H) 03/24/2014 10:08 AM   TRIG 129.0 03/24/2014 10:08 AM   HDL 44.80 03/24/2014 10:08 AM   CHOLHDL 5 03/24/2014 10:08 AM   LDLCALC 137 (H) 03/24/2014 10:08 AM    Wt Readings from Last 3 Encounters:  10/04/18 96 lb (43.5 kg)  04/30/18 98 lb (44.5 kg)  01/27/17 107 lb (48.5 kg)     Objective:    Vital Signs:  BP (!) 166/90   Pulse 70   Ht 5' (1.524 m)   Wt 96 lb (43.5 kg)   SpO2 98%   BMI 18.75 kg/m   Affect appropriate Healthy:  appears stated age HEENT: normal Neck supple with no adenopathy JVP normal left  bruits no thyromegaly Lungs clear with no wheezing and good diaphragmatic motion Heart:  S1/S2 AS  murmur, no rub, gallop or click PMI normal Abdomen: benighn, BS positve, no tenderness, no AAA no bruit.  No HSM or HJR Distal pulses intact with no bruits No edema Neuro non-focal Skin warm and dry Right TKR    ASSESSMENT & PLAN:    1. AS: 01/30/17 mean gradient 25 mmHg   Will review echo from today when available She would Be at most a TAVR candidate if AS progresses  2. CAD/CABG:  2004 no chest pain observe given age and lack of symptoms 3. HLD:  On zetia labs with primary 4. Thyroid:  On replacement labs with primary   COVID-19 Education: The signs and symptoms of COVID-19 were discussed with the patient and how to seek care for testing (follow up with PCP or arrange E-visit).  The importance of social distancing was discussed today.  Time:   Today, I have spent 30 minutes with the patient     Medication Adjustments/Labs and Tests Ordered: Current medicines are reviewed at length with the patient today.  Concerns regarding medicines are outlined above.  Tests Ordered:  Echo for AS  Being done today 10/04/18  Medication Changes: No orders of the defined types were placed in this encounter.   Disposition:  Follow up in 6 months   Signed, Jenkins Rouge, MD  10/04/2018 10:28 AM    [place]

## 2018-10-03 ENCOUNTER — Other Ambulatory Visit (HOSPITAL_COMMUNITY): Payer: Medicare Other

## 2018-10-04 ENCOUNTER — Ambulatory Visit (INDEPENDENT_AMBULATORY_CARE_PROVIDER_SITE_OTHER): Payer: Medicare Other | Admitting: Cardiovascular Disease

## 2018-10-04 ENCOUNTER — Other Ambulatory Visit: Payer: Self-pay

## 2018-10-04 ENCOUNTER — Ambulatory Visit (HOSPITAL_COMMUNITY): Payer: Medicare Other | Attending: Cardiovascular Disease

## 2018-10-04 ENCOUNTER — Encounter: Payer: Self-pay | Admitting: Cardiovascular Disease

## 2018-10-04 VITALS — BP 166/90 | HR 70 | Ht 60.0 in | Wt 96.0 lb

## 2018-10-04 DIAGNOSIS — Z951 Presence of aortocoronary bypass graft: Secondary | ICD-10-CM

## 2018-10-04 DIAGNOSIS — I350 Nonrheumatic aortic (valve) stenosis: Secondary | ICD-10-CM

## 2018-10-04 LAB — ECHOCARDIOGRAM COMPLETE
Height: 60 in
Weight: 1536 oz

## 2018-10-04 NOTE — Patient Instructions (Addendum)
Medication Instructions:   If you need a refill on your cardiac medications before your next appointment, please call your pharmacy.   Lab work:  If you have labs (blood work) drawn today and your tests are completely normal, you will receive your results only by: [name] MyChart Message (if you have MyChart) OR . A paper copy in the mail If you have any lab test that is abnormal or we need to change your treatment, we will call you to review the results.  Testing/Procedures: None ordered today.   Follow-Up: At [facility], you and your health needs are our priority.  As part of our continuing mission to provide you with exceptional heart care, we have created designated Provider Care Teams.  These Care Teams include your primary Cardiologist (physician) and Advanced Practice Providers (APPs -  Physician Assistants and Nurse Practitioners) who all work together to provide you with the care you need, when you need it. You will need a follow up appointment in 6 month.   Please call our office 2 months in advance to schedule this appointment.  You may see Jenkins Rouge, MD or one of the following Advanced Practice Providers on your designated Care Team:   Truitt Merle, NP Cecilie Kicks, NP . Kathyrn Drown, NP

## 2018-10-11 ENCOUNTER — Telehealth: Payer: Self-pay

## 2018-10-11 DIAGNOSIS — I350 Nonrheumatic aortic (valve) stenosis: Secondary | ICD-10-CM

## 2018-10-11 NOTE — Telephone Encounter (Signed)
Patient aware of results of echo. Per Dr. Johnsie Cancel, AS a bit worse but still not severe f/u echo in 6 months. Patient verbalized understanding. Will send message to [facility] to schedule.

## 2018-10-11 NOTE — Telephone Encounter (Signed)
-----   Message from [name], MD sent at 10/04/2018  4:43 PM EDT ----- AS a bit worse but still not severe f/u echo in 6 month s

## 2018-10-22 DIAGNOSIS — Z0120 Encounter for dental examination and cleaning without abnormal findings: Secondary | ICD-10-CM | POA: Diagnosis not present

## 2019-01-25 DIAGNOSIS — G301 Alzheimer's disease with late onset: Secondary | ICD-10-CM | POA: Diagnosis not present

## 2019-01-25 DIAGNOSIS — I129 Hypertensive chronic kidney disease with stage 1 through stage 4 chronic kidney disease, or unspecified chronic kidney disease: Secondary | ICD-10-CM | POA: Diagnosis not present

## 2019-01-25 DIAGNOSIS — E039 Hypothyroidism, unspecified: Secondary | ICD-10-CM | POA: Diagnosis not present

## 2019-01-25 DIAGNOSIS — N183 Chronic kidney disease, stage 3 unspecified: Secondary | ICD-10-CM | POA: Diagnosis not present

## 2019-01-25 DIAGNOSIS — F0280 Dementia in other diseases classified elsewhere without behavioral disturbance: Secondary | ICD-10-CM | POA: Diagnosis not present

## 2019-01-25 DIAGNOSIS — M810 Age-related osteoporosis without current pathological fracture: Secondary | ICD-10-CM | POA: Diagnosis not present

## 2019-02-26 DIAGNOSIS — G301 Alzheimer's disease with late onset: Secondary | ICD-10-CM | POA: Diagnosis not present

## 2019-02-26 DIAGNOSIS — I129 Hypertensive chronic kidney disease with stage 1 through stage 4 chronic kidney disease, or unspecified chronic kidney disease: Secondary | ICD-10-CM | POA: Diagnosis not present

## 2019-02-26 DIAGNOSIS — F0280 Dementia in other diseases classified elsewhere without behavioral disturbance: Secondary | ICD-10-CM | POA: Diagnosis not present

## 2019-02-26 DIAGNOSIS — M810 Age-related osteoporosis without current pathological fracture: Secondary | ICD-10-CM | POA: Diagnosis not present

## 2019-02-26 DIAGNOSIS — Z1389 Encounter for screening for other disorder: Secondary | ICD-10-CM | POA: Diagnosis not present

## 2019-02-26 DIAGNOSIS — E039 Hypothyroidism, unspecified: Secondary | ICD-10-CM | POA: Diagnosis not present

## 2019-02-26 DIAGNOSIS — K219 Gastro-esophageal reflux disease without esophagitis: Secondary | ICD-10-CM | POA: Diagnosis not present

## 2019-02-26 DIAGNOSIS — E559 Vitamin D deficiency, unspecified: Secondary | ICD-10-CM | POA: Diagnosis not present

## 2019-02-26 DIAGNOSIS — Z79899 Other long term (current) drug therapy: Secondary | ICD-10-CM | POA: Diagnosis not present

## 2019-02-26 DIAGNOSIS — N183 Chronic kidney disease, stage 3 unspecified: Secondary | ICD-10-CM | POA: Diagnosis not present

## 2019-02-26 DIAGNOSIS — Z0000 Encounter for general adult medical examination without abnormal findings: Secondary | ICD-10-CM | POA: Diagnosis not present

## 2019-03-29 DIAGNOSIS — M810 Age-related osteoporosis without current pathological fracture: Secondary | ICD-10-CM | POA: Diagnosis not present

## 2019-03-29 DIAGNOSIS — N1832 Chronic kidney disease, stage 3b: Secondary | ICD-10-CM | POA: Diagnosis not present

## 2019-03-29 DIAGNOSIS — I129 Hypertensive chronic kidney disease with stage 1 through stage 4 chronic kidney disease, or unspecified chronic kidney disease: Secondary | ICD-10-CM | POA: Diagnosis not present

## 2019-03-29 DIAGNOSIS — F0280 Dementia in other diseases classified elsewhere without behavioral disturbance: Secondary | ICD-10-CM | POA: Diagnosis not present

## 2019-03-29 DIAGNOSIS — E039 Hypothyroidism, unspecified: Secondary | ICD-10-CM | POA: Diagnosis not present

## 2019-03-29 DIAGNOSIS — N183 Chronic kidney disease, stage 3 unspecified: Secondary | ICD-10-CM | POA: Diagnosis not present

## 2019-03-29 DIAGNOSIS — G301 Alzheimer's disease with late onset: Secondary | ICD-10-CM | POA: Diagnosis not present

## 2019-04-30 ENCOUNTER — Ambulatory Visit (HOSPITAL_COMMUNITY): Payer: Medicare HMO | Attending: Internal Medicine

## 2019-04-30 DIAGNOSIS — I350 Nonrheumatic aortic (valve) stenosis: Secondary | ICD-10-CM | POA: Diagnosis not present

## 2019-05-01 ENCOUNTER — Telehealth: Payer: Self-pay

## 2019-05-01 DIAGNOSIS — I350 Nonrheumatic aortic (valve) stenosis: Secondary | ICD-10-CM

## 2019-05-01 NOTE — Telephone Encounter (Signed)
Patient aware of results. Per Dr. Johnsie Cancel AS some progression now in severe range. Patient is not having any symptoms so will repeat study in 6 months.

## 2019-05-01 NOTE — Telephone Encounter (Signed)
-----   Message from [name], MD sent at 04/30/2019  4:53 PM EDT ----- AS some progression now in severe range if she is not having symptoms would repeat echo in 6 months Not a candidate for surgery and may not be TAVR candidate If she is having chest pain, lightheadedness or more dyspnea have her f/u with me

## 2019-05-04 ENCOUNTER — Ambulatory Visit: Payer: Medicare HMO | Attending: Internal Medicine

## 2019-05-04 DIAGNOSIS — Z23 Encounter for immunization: Secondary | ICD-10-CM

## 2019-05-04 NOTE — Progress Notes (Signed)
   Covid-19 Vaccination Clinic  Name:  [name]    MRN: [id number] DOB: 06-01-1931  05/04/2019  [name] was observed post Covid-19 immunization for 15 minutes without incident. She was provided with Vaccine Information Sheet and instruction to access the V-Safe system.   [name] was instructed to call 911 with any severe reactions post vaccine: [name] Difficulty breathing  . Swelling of face and throat  . A fast heartbeat  . A bad rash all over body  . Dizziness and weakness   Immunizations Administered    Name Date Dose VIS Date Route   Pfizer COVID-19 Vaccine 05/04/2019 10:08 AM 0.3 mL 12/28/2018 Intramuscular   Manufacturer: [place]   Lot: [id number]   [place]: [id number]

## 2019-05-08 DIAGNOSIS — G301 Alzheimer's disease with late onset: Secondary | ICD-10-CM | POA: Diagnosis not present

## 2019-05-08 DIAGNOSIS — E039 Hypothyroidism, unspecified: Secondary | ICD-10-CM | POA: Diagnosis not present

## 2019-05-08 DIAGNOSIS — F0280 Dementia in other diseases classified elsewhere without behavioral disturbance: Secondary | ICD-10-CM | POA: Diagnosis not present

## 2019-05-08 DIAGNOSIS — N183 Chronic kidney disease, stage 3 unspecified: Secondary | ICD-10-CM | POA: Diagnosis not present

## 2019-05-08 DIAGNOSIS — M810 Age-related osteoporosis without current pathological fracture: Secondary | ICD-10-CM | POA: Diagnosis not present

## 2019-05-08 DIAGNOSIS — N1832 Chronic kidney disease, stage 3b: Secondary | ICD-10-CM | POA: Diagnosis not present

## 2019-05-08 DIAGNOSIS — I129 Hypertensive chronic kidney disease with stage 1 through stage 4 chronic kidney disease, or unspecified chronic kidney disease: Secondary | ICD-10-CM | POA: Diagnosis not present

## 2019-05-28 ENCOUNTER — Ambulatory Visit: Payer: Medicare Other | Attending: Internal Medicine

## 2019-05-28 DIAGNOSIS — Z23 Encounter for immunization: Secondary | ICD-10-CM

## 2019-05-28 NOTE — Progress Notes (Signed)
   Covid-19 Vaccination Clinic  Name:  [name]    MRN: [id number] DOB: 11-11-1931  05/28/2019  [name] was observed post Covid-19 immunization for 15 minutes without incident. She was provided with Vaccine Information Sheet and instruction to access the V-Safe system.   [name] was instructed to call 911 with any severe reactions post vaccine: [name] Difficulty breathing  . Swelling of face and throat  . A fast heartbeat  . A bad rash all over body  . Dizziness and weakness   Immunizations Administered    Name Date Dose VIS Date Route   Pfizer COVID-19 Vaccine 05/28/2019 10:43 AM 0.3 mL 03/13/2018 Intramuscular   Manufacturer: [place]   Lot: [id number]   [place]: [id number]

## 2019-10-09 ENCOUNTER — Encounter (HOSPITAL_COMMUNITY): Payer: Self-pay | Admitting: Cardiovascular Disease

## 2019-10-23 ENCOUNTER — Telehealth (HOSPITAL_COMMUNITY): Payer: Self-pay | Admitting: Cardiovascular Disease

## 2019-10-23 NOTE — Telephone Encounter (Signed)
Just an FYI. We have made several attempts to contact this patient including sending a letter to schedule or reschedule their echocardiogram. We will be removing the patient from the echo [place].   10/09/2019 MAILED LETTER [facility]  10/07/19 [facility] to schedule @ 2:20/[facility]W  10/04/2019 [facility] to schedule @ 9am/[facility]  10/01/2019 [facility] to schedule @ 1:56/LBW    Thank you 

## 2019-11-01 ENCOUNTER — Encounter: Payer: Self-pay | Admitting: Cardiovascular Disease

## 2019-11-01 NOTE — Telephone Encounter (Signed)
error 

## 2019-11-04 ENCOUNTER — Other Ambulatory Visit: Payer: Self-pay

## 2019-11-04 ENCOUNTER — Encounter: Payer: Self-pay | Admitting: Cardiovascular Disease

## 2019-11-04 ENCOUNTER — Ambulatory Visit: Payer: Medicare Other | Admitting: Cardiovascular Disease

## 2019-11-04 VITALS — BP 138/74 | HR 69 | Ht 60.0 in | Wt 90.0 lb

## 2019-11-04 DIAGNOSIS — I350 Nonrheumatic aortic (valve) stenosis: Secondary | ICD-10-CM

## 2019-11-04 DIAGNOSIS — Z951 Presence of aortocoronary bypass graft: Secondary | ICD-10-CM | POA: Diagnosis not present

## 2019-11-04 NOTE — Progress Notes (Signed)
Evaluation Performed:  Follow-up visit  Date:  11/04/2019   ID:  [name], DOB Mar 27, 1931, MRN [id number]  Provider Location: Office  PCP:  Lajean Manes, MD  Cardiologist:  Jenkins Rouge, MD   Electrophysiologist:  None   Chief Complaint:  CAD/AS  History of Present Illness:    [name] is an 86 y.o. female with a history of ASCAD s/p CABG, HTN, normal LVF, dyslipidemia and mild AS    CABG 2004 with LIMA to LAD SVG to Diagonal SVG OM and SVG PDA Last echo EF 50-55% mean gradient 25 mmHg peak 39 mmHg DVI .27   No palpitations syncope or chest pain   I took care of her 2nd husband [name]  Sister [name] is a patient of mine as well Discussed need for f/u TTE for her AS  Echo 01/30/17 with EF 50-55% moderate AS mean gradient 25 mmHg peak 39 mmHg VTI DVI 0.29    Last echo 04/30/19 with EF 55-60% mean gradient 40 mmHg AVA 0.56 cm2 DVI 0.20 peak gradient 75 mmHg  She has advanced dementia and is not a TAVR candidate She has 3 boys one [name] lives with her and one lives close with her sister [name] They need to have family conference to consider assisted living for her   Had COVID vaccine in May     Past Medical History:  Diagnosis Date  . Anemia   . Aortic stenosis    mild AS by 02/18/11  [name]  . Arthritis   . Bradycardia    Asymtomatic resolved  . Cancer (HCC)    ceervical  . Chronic kidney disease    renal insufficency, stage III  . Chronic renal disease, stage III ([place])   . Coronary artery disease    CABG 2003  . Dyslipidemia    goal LSL less then 70 but statin intolerant  . GERD (gastroesophageal reflux disease)   . Hypertension   . Hypothyroidism   . Iron deficiency anemia 7/12  . Mild AI (aortic insufficiency)   . Mild aortic stenosis   . OP (osteoporosis) 10/2010   fosamax  . Statin intolerance    Past Surgical History:  Procedure Laterality Date  . ABDOMINAL HYSTERECTOMY    . CARDIAC CATHETERIZATION     2004  .  cataracts    . COLONOSCOPY  09/2010   normal EGD abd COLONOSCOPY  . CORONARY ARTERY BYPASS GRAFT  2004   with LIMA to LAD, SVG to diagonal, SVG to OM, SOVG to PDA normal LVF  . LUMBAR LAMINECTOMY  09/23/2011   Procedure: MICRODISCECTOMY LUMBAR LAMINECTOMY;  Surgeon: Marybelle Killings, MD;  Location: [place];  Service: Orthopedics;  Laterality: N/A;  L4-5 Decompression     No outpatient medications have been marked as taking for the 11/04/19 encounter (Appointment) with [name], MD.     Allergies:   Aleve [naproxen sodium], Excedrin extra strength [aspirin-acetaminophen-caffeine], Statins, and Vytorin [ezetimibe-simvastatin]   Social History   Tobacco Use  . Smoking status: Never Smoker  . Smokeless tobacco: Never Used  Substance Use Topics  . Alcohol use: No  . Drug use: No     Family Hx: The patient's family history includes Asthma in her father; Heart Problems in her mother.  ROS:   Please see the history of present illness.     All other systems reviewed and are negative.   Prior CV studies:   The following studies were reviewed today: Echo  01/30/17  Echo 10/04/18  Echo 04/30/19   Labs/Other Tests and Data Reviewed:    EKG:   04/07/16 SR rate 58 normal SR read as flutter but artifact rate 70 normal   Recent Labs: No results found for requested labs within last 8760 hours.   Recent Lipid Panel Lab Results  Component Value Date/Time   CHOL 208 (H) 03/24/2014 10:08 AM   TRIG 129.0 03/24/2014 10:08 AM   HDL 44.80 03/24/2014 10:08 AM   CHOLHDL 5 03/24/2014 10:08 AM   LDLCALC 137 (H) 03/24/2014 10:08 AM    Wt Readings from Last 3 Encounters:  10/04/18 96 lb (43.5 kg)  04/30/18 98 lb (44.5 kg)  01/27/17 107 lb (48.5 kg)     Objective:    Vital Signs:  There were no vitals taken for this visit.  Affect appropriate Healthy:  appears stated age 45: normal Neck supple with no adenopathy JVP normal left  bruits no thyromegaly Lungs clear with no wheezing and  good diaphragmatic motion Heart:  S1/S2 AS  murmur, no rub, gallop or click PMI normal Abdomen: benighn, BS positve, no tenderness, no AAA no bruit.  No HSM or HJR Distal pulses intact with no bruits No edema Neuro non-focal Skin warm and dry Right TKR    ASSESSMENT & PLAN:    1. AS:  Severe mean gradient 40 mmHg by TTE 04/30/19 also with mild/moderate MR and normal LVEF given age and advanced dementia not a candidate for TAVR  2. CAD/CABG:  2004 no chest pain observe given age and lack of symptoms 3. HLD:  On zetia labs with primary 4. Thyroid:  On replacement labs with primary   COVID-19 Education: The signs and symptoms of COVID-19 were discussed with the patient and how to seek care for testing (follow up with PCP or arrange E-visit).  The importance of social distancing was discussed today.  Time:   Today, I have spent 30 minutes with the patient     Medication Adjustments/Labs and Tests Ordered: Current medicines are reviewed at length with the patient today.  Concerns regarding medicines are outlined above.  Tests Ordered:  None   Medication Changes: No orders of the defined types were placed in this encounter.   Disposition:  Follow up in 6 months   Signed, Jenkins Rouge, MD  11/04/2019 2:36 PM    [facility]

## 2019-11-04 NOTE — Patient Instructions (Signed)

## 2019-11-22 ENCOUNTER — Encounter (HOSPITAL_COMMUNITY): Payer: Self-pay | Admitting: Cardiology

## 2019-11-22 ENCOUNTER — Telehealth (HOSPITAL_COMMUNITY): Payer: Self-pay | Admitting: Cardiovascular Disease

## 2019-11-22 ENCOUNTER — Other Ambulatory Visit (HOSPITAL_COMMUNITY): Payer: Medicare Other

## 2019-11-22 NOTE — Progress Notes (Unsigned)
Patient ID: [name], female   DOB: 01-28-1931, 86 y.o.   MRN: [id number]   Verified appointment "no show" status with Renetta at 11:48am.

## 2019-11-22 NOTE — Telephone Encounter (Signed)
Just an FYI. We have made several attempts to contact this patient including sending a letter to schedule or reschedule their echocardiogram. We will be removing the patient from the echo [place].   10/09/2019 MAILED LETTER [facility]  10/07/19 [facility] to schedule @ 2:20/[facility]W  10/04/2019 [facility] to schedule @ 9am/[facility]  10/01/2019 [facility] to schedule @ 1:56/LBW    Thank you

## 2019-11-28 ENCOUNTER — Other Ambulatory Visit: Payer: Self-pay

## 2019-11-28 ENCOUNTER — Ambulatory Visit (HOSPITAL_COMMUNITY): Payer: Medicare Other | Attending: Internal Medicine

## 2019-11-28 DIAGNOSIS — I350 Nonrheumatic aortic (valve) stenosis: Secondary | ICD-10-CM | POA: Diagnosis not present

## 2019-11-28 LAB — ECHOCARDIOGRAM COMPLETE
AR max vel: 0.51 cm2
AV Area VTI: 0.4 cm2
AV Area mean vel: 0.46 cm2
AV Mean grad: 53 mmHg
AV Peak grad: 69.3 mmHg
Ao pk vel: 4.16 m/s
Area-P 1/2: 5.27 cm2
P 1/2 time: 547 msec
S' Lateral: 3.7 cm

## 2019-12-27 ENCOUNTER — Telehealth: Payer: Self-pay | Admitting: Cardiovascular Disease

## 2019-12-27 NOTE — Telephone Encounter (Signed)
New message:    Patient calling stating that she had a letter sent to her concering some results. Please call patient.

## 2019-12-27 NOTE — Telephone Encounter (Signed)
Called the patient back regarding the letter she received about her Echocardiogram results. No answer, left a message to call the office back.

## 2020-02-13 DIAGNOSIS — K219 Gastro-esophageal reflux disease without esophagitis: Secondary | ICD-10-CM | POA: Diagnosis not present

## 2020-02-13 DIAGNOSIS — N1832 Chronic kidney disease, stage 3b: Secondary | ICD-10-CM | POA: Diagnosis not present

## 2020-02-13 DIAGNOSIS — M810 Age-related osteoporosis without current pathological fracture: Secondary | ICD-10-CM | POA: Diagnosis not present

## 2020-02-13 DIAGNOSIS — E039 Hypothyroidism, unspecified: Secondary | ICD-10-CM | POA: Diagnosis not present

## 2020-02-13 DIAGNOSIS — I129 Hypertensive chronic kidney disease with stage 1 through stage 4 chronic kidney disease, or unspecified chronic kidney disease: Secondary | ICD-10-CM | POA: Diagnosis not present

## 2020-02-13 DIAGNOSIS — G301 Alzheimer's disease with late onset: Secondary | ICD-10-CM | POA: Diagnosis not present

## 2020-03-03 DIAGNOSIS — G301 Alzheimer's disease with late onset: Secondary | ICD-10-CM | POA: Diagnosis not present

## 2020-03-03 DIAGNOSIS — N183 Chronic kidney disease, stage 3 unspecified: Secondary | ICD-10-CM | POA: Diagnosis not present

## 2020-03-03 DIAGNOSIS — E039 Hypothyroidism, unspecified: Secondary | ICD-10-CM | POA: Diagnosis not present

## 2020-03-03 DIAGNOSIS — I129 Hypertensive chronic kidney disease with stage 1 through stage 4 chronic kidney disease, or unspecified chronic kidney disease: Secondary | ICD-10-CM | POA: Diagnosis not present

## 2020-03-03 DIAGNOSIS — K219 Gastro-esophageal reflux disease without esophagitis: Secondary | ICD-10-CM | POA: Diagnosis not present

## 2020-03-03 DIAGNOSIS — M810 Age-related osteoporosis without current pathological fracture: Secondary | ICD-10-CM | POA: Diagnosis not present

## 2020-03-05 NOTE — Telephone Encounter (Signed)
Left message for patient to call back. If she does not call back will give her results at next office visit.

## 2020-03-22 ENCOUNTER — Emergency Department (HOSPITAL_COMMUNITY): Payer: Medicare Other

## 2020-03-22 ENCOUNTER — Other Ambulatory Visit: Payer: Self-pay

## 2020-03-22 ENCOUNTER — Inpatient Hospital Stay (HOSPITAL_COMMUNITY): Payer: Medicare Other

## 2020-03-22 ENCOUNTER — Encounter (HOSPITAL_COMMUNITY): Payer: Self-pay | Admitting: Internal Medicine

## 2020-03-22 ENCOUNTER — Inpatient Hospital Stay (HOSPITAL_COMMUNITY)
Admission: EM | Admit: 2020-03-22 | Discharge: 2020-03-25 | DRG: 521 | Disposition: A | Discharge status: Home Health | Payer: Medicare Other | Attending: Internal Medicine | Admitting: Internal Medicine

## 2020-03-22 DIAGNOSIS — N289 Disorder of kidney and ureter, unspecified: Secondary | ICD-10-CM | POA: Diagnosis not present

## 2020-03-22 DIAGNOSIS — Z79890 Hormone replacement therapy (postmenopausal): Secondary | ICD-10-CM

## 2020-03-22 DIAGNOSIS — Z01810 Encounter for preprocedural cardiovascular examination: Secondary | ICD-10-CM | POA: Diagnosis not present

## 2020-03-22 DIAGNOSIS — I129 Hypertensive chronic kidney disease with stage 1 through stage 4 chronic kidney disease, or unspecified chronic kidney disease: Secondary | ICD-10-CM | POA: Diagnosis not present

## 2020-03-22 DIAGNOSIS — K219 Gastro-esophageal reflux disease without esophagitis: Secondary | ICD-10-CM | POA: Diagnosis present

## 2020-03-22 DIAGNOSIS — N1832 Chronic kidney disease, stage 3b: Secondary | ICD-10-CM | POA: Diagnosis present

## 2020-03-22 DIAGNOSIS — G309 Alzheimer's disease, unspecified: Secondary | ICD-10-CM | POA: Diagnosis not present

## 2020-03-22 DIAGNOSIS — N189 Chronic kidney disease, unspecified: Secondary | ICD-10-CM

## 2020-03-22 DIAGNOSIS — D62 Acute posthemorrhagic anemia: Secondary | ICD-10-CM | POA: Diagnosis not present

## 2020-03-22 DIAGNOSIS — S72002A Fracture of unspecified part of neck of left femur, initial encounter for closed fracture: Secondary | ICD-10-CM | POA: Diagnosis present

## 2020-03-22 DIAGNOSIS — I10 Essential (primary) hypertension: Secondary | ICD-10-CM | POA: Diagnosis not present

## 2020-03-22 DIAGNOSIS — Z951 Presence of aortocoronary bypass graft: Secondary | ICD-10-CM | POA: Diagnosis not present

## 2020-03-22 DIAGNOSIS — I2510 Atherosclerotic heart disease of native coronary artery without angina pectoris: Secondary | ICD-10-CM | POA: Diagnosis not present

## 2020-03-22 DIAGNOSIS — I350 Nonrheumatic aortic (valve) stenosis: Secondary | ICD-10-CM | POA: Diagnosis not present

## 2020-03-22 DIAGNOSIS — R7989 Other specified abnormal findings of blood chemistry: Secondary | ICD-10-CM | POA: Diagnosis not present

## 2020-03-22 DIAGNOSIS — U071 COVID-19: Secondary | ICD-10-CM | POA: Diagnosis present

## 2020-03-22 DIAGNOSIS — E785 Hyperlipidemia, unspecified: Secondary | ICD-10-CM | POA: Diagnosis present

## 2020-03-22 DIAGNOSIS — E871 Hypo-osmolality and hyponatremia: Secondary | ICD-10-CM | POA: Diagnosis not present

## 2020-03-22 DIAGNOSIS — E039 Hypothyroidism, unspecified: Secondary | ICD-10-CM | POA: Diagnosis not present

## 2020-03-22 DIAGNOSIS — F0280 Dementia in other diseases classified elsewhere without behavioral disturbance: Secondary | ICD-10-CM | POA: Diagnosis present

## 2020-03-22 DIAGNOSIS — W010XXA Fall on same level from slipping, tripping and stumbling without subsequent striking against object, initial encounter: Secondary | ICD-10-CM | POA: Diagnosis present

## 2020-03-22 DIAGNOSIS — Z90710 Acquired absence of both cervix and uterus: Secondary | ICD-10-CM

## 2020-03-22 DIAGNOSIS — Z79899 Other long term (current) drug therapy: Secondary | ICD-10-CM

## 2020-03-22 DIAGNOSIS — M810 Age-related osteoporosis without current pathological fracture: Secondary | ICD-10-CM | POA: Diagnosis present

## 2020-03-22 DIAGNOSIS — M25552 Pain in left hip: Secondary | ICD-10-CM | POA: Diagnosis not present

## 2020-03-22 DIAGNOSIS — M48061 Spinal stenosis, lumbar region without neurogenic claudication: Secondary | ICD-10-CM | POA: Diagnosis not present

## 2020-03-22 DIAGNOSIS — Z471 Aftercare following joint replacement surgery: Secondary | ICD-10-CM | POA: Diagnosis not present

## 2020-03-22 DIAGNOSIS — E7800 Pure hypercholesterolemia, unspecified: Secondary | ICD-10-CM | POA: Diagnosis not present

## 2020-03-22 DIAGNOSIS — N179 Acute kidney failure, unspecified: Secondary | ICD-10-CM | POA: Diagnosis not present

## 2020-03-22 DIAGNOSIS — R001 Bradycardia, unspecified: Secondary | ICD-10-CM | POA: Diagnosis not present

## 2020-03-22 DIAGNOSIS — Z96642 Presence of left artificial hip joint: Secondary | ICD-10-CM | POA: Diagnosis not present

## 2020-03-22 DIAGNOSIS — S72012A Unspecified intracapsular fracture of left femur, initial encounter for closed fracture: Principal | ICD-10-CM | POA: Diagnosis present

## 2020-03-22 DIAGNOSIS — Y92008 Other place in unspecified non-institutional (private) residence as the place of occurrence of the external cause: Secondary | ICD-10-CM | POA: Diagnosis not present

## 2020-03-22 DIAGNOSIS — Z7982 Long term (current) use of aspirin: Secondary | ICD-10-CM | POA: Diagnosis not present

## 2020-03-22 DIAGNOSIS — I517 Cardiomegaly: Secondary | ICD-10-CM | POA: Diagnosis not present

## 2020-03-22 DIAGNOSIS — W19XXXA Unspecified fall, initial encounter: Secondary | ICD-10-CM | POA: Diagnosis not present

## 2020-03-22 DIAGNOSIS — I352 Nonrheumatic aortic (valve) stenosis with insufficiency: Secondary | ICD-10-CM | POA: Diagnosis not present

## 2020-03-22 DIAGNOSIS — S72042A Displaced fracture of base of neck of left femur, initial encounter for closed fracture: Secondary | ICD-10-CM | POA: Diagnosis not present

## 2020-03-22 DIAGNOSIS — Z419 Encounter for procedure for purposes other than remedying health state, unspecified: Secondary | ICD-10-CM

## 2020-03-22 LAB — TYPE AND SCREEN
ABO/RH(D): A POS
Antibody Screen: NEGATIVE

## 2020-03-22 LAB — CBC WITH DIFFERENTIAL/PLATELET
Abs Immature Granulocytes: 0.04 10*3/uL (ref 0.00–0.07)
Basophils Absolute: 0.1 10*3/uL (ref 0.0–0.1)
Basophils Relative: 1 %
Eosinophils Absolute: 0.2 10*3/uL (ref 0.0–0.5)
Eosinophils Relative: 2 %
HCT: 31.8 % — ABNORMAL LOW (ref 36.0–46.0)
Hemoglobin: 10.2 g/dL — ABNORMAL LOW (ref 12.0–15.0)
Immature Granulocytes: 1 %
Lymphocytes Relative: 16 %
Lymphs Abs: 1.4 10*3/uL (ref 0.7–4.0)
MCH: 30.6 pg (ref 26.0–34.0)
MCHC: 32.1 g/dL (ref 30.0–36.0)
MCV: 95.5 fL (ref 80.0–100.0)
Monocytes Absolute: 0.6 10*3/uL (ref 0.1–1.0)
Monocytes Relative: 7 %
Neutro Abs: 6.3 10*3/uL (ref 1.7–7.7)
Neutrophils Relative %: 73 %
Platelets: 224 10*3/uL (ref 150–400)
RBC: 3.33 MIL/uL — ABNORMAL LOW (ref 3.87–5.11)
RDW: 13.9 % (ref 11.5–15.5)
WBC: 8.5 10*3/uL (ref 4.0–10.5)
nRBC: 0 % (ref 0.0–0.2)

## 2020-03-22 LAB — RESP PANEL BY RT-PCR (FLU A&B, COVID) ARPGX2
Influenza A by PCR: NEGATIVE
Influenza B by PCR: NEGATIVE
SARS Coronavirus 2 by RT PCR: POSITIVE — AB

## 2020-03-22 LAB — FERRITIN: Ferritin: 128 ng/mL (ref 11–307)

## 2020-03-22 LAB — BASIC METABOLIC PANEL
Anion gap: 10 (ref 5–15)
BUN: 20 mg/dL (ref 8–23)
CO2: 23 mmol/L (ref 22–32)
Calcium: 9.3 mg/dL (ref 8.9–10.3)
Chloride: 103 mmol/L (ref 98–111)
Creatinine, Ser: 1.72 mg/dL — ABNORMAL HIGH (ref 0.44–1.00)
GFR, Estimated: 29 mL/min — ABNORMAL LOW (ref >60)
Glucose, Bld: 105 mg/dL — ABNORMAL HIGH (ref 70–99)
Potassium: 4.1 mmol/L (ref 3.5–5.1)
Sodium: 136 mmol/L (ref 135–145)

## 2020-03-22 LAB — PROTIME-INR
INR: 1.1 (ref 0.8–1.2)
Prothrombin Time: 13.3 seconds (ref 11.4–15.2)

## 2020-03-22 LAB — D-DIMER, QUANTITATIVE: D-Dimer, Quant: 10.18 ug/mL-FEU — ABNORMAL HIGH (ref 0.00–0.50)

## 2020-03-22 LAB — C-REACTIVE PROTEIN: CRP: 0.7 mg/dL

## 2020-03-22 LAB — ABO/RH: ABO/RH(D): A POS

## 2020-03-22 MED ORDER — ASPIRIN EC 81 MG PO TBEC
81.0000 mg | DELAYED_RELEASE_TABLET | Freq: Every day | ORAL | Status: DC
  Administered 2020-03-22 – 2020-03-24 (×3): 81 mg via ORAL
  Filled 2020-03-22 (×3): qty 1

## 2020-03-22 MED ORDER — AMLODIPINE BESYLATE 5 MG PO TABS: 5.0000 mg | ORAL_TABLET | Freq: Every day | ORAL | Status: DC

## 2020-03-22 MED ORDER — HYDROCODONE-ACETAMINOPHEN 5-325 MG PO TABS
1.0000 | ORAL_TABLET | Freq: Four times a day (QID) | ORAL | Status: DC | PRN
  Administered 2020-03-22: 2 via ORAL
  Administered 2020-03-24: 1 via ORAL
  Filled 2020-03-22: qty 1
  Filled 2020-03-22: qty 2

## 2020-03-22 MED ORDER — CALCIUM CARBONATE ANTACID 500 MG PO CHEW
1.0000 | CHEWABLE_TABLET | Freq: Every day | ORAL | Status: DC
  Administered 2020-03-22 – 2020-03-24 (×3): 200 mg via ORAL
  Filled 2020-03-22 (×3): qty 1

## 2020-03-22 MED ORDER — DONEPEZIL HCL 5 MG PO TABS
5.0000 mg | ORAL_TABLET | Freq: Every day | ORAL | Status: DC
  Administered 2020-03-22 – 2020-03-24 (×3): 5 mg via ORAL
  Filled 2020-03-22 (×3): qty 1

## 2020-03-22 MED ORDER — SODIUM CHLORIDE 0.9 % IV SOLN
200.0000 mg | Freq: Once | INTRAVENOUS | Status: AC
  Administered 2020-03-22: 200 mg via INTRAVENOUS
  Filled 2020-03-22: qty 40

## 2020-03-22 MED ORDER — CARVEDILOL 12.5 MG PO TABS
12.5000 mg | ORAL_TABLET | Freq: Two times a day (BID) | ORAL | Status: DC
  Administered 2020-03-22 – 2020-03-23 (×3): 12.5 mg via ORAL
  Filled 2020-03-22 (×4): qty 1

## 2020-03-22 MED ORDER — EZETIMIBE 10 MG PO TABS
10.0000 mg | ORAL_TABLET | Freq: Every day | ORAL | Status: DC
  Administered 2020-03-22 – 2020-03-24 (×3): 10 mg via ORAL
  Filled 2020-03-22 (×3): qty 1

## 2020-03-22 MED ORDER — VITAMIN D (ERGOCALCIFEROL) 1.25 MG (50000 UNIT) PO CAPS
50000.0000 [IU] | ORAL_CAPSULE | ORAL | Status: DC
  Administered 2020-03-23: 50000 [IU] via ORAL
  Filled 2020-03-22: qty 1

## 2020-03-22 MED ORDER — LEVOTHYROXINE SODIUM 50 MCG PO TABS
75.0000 ug | ORAL_TABLET | Freq: Every day | ORAL | Status: DC
  Administered 2020-03-22 – 2020-03-25 (×3): 75 ug via ORAL
  Filled 2020-03-22 (×3): qty 1

## 2020-03-22 MED ORDER — MORPHINE SULFATE (PF) 2 MG/ML IV SOLN
0.5000 mg | INTRAVENOUS | Status: DC | PRN
  Administered 2020-03-22: 0.5 mg via INTRAVENOUS
  Filled 2020-03-22: qty 1

## 2020-03-22 MED ORDER — FERROUS SULFATE 325 (65 FE) MG PO TABS
325.0000 mg | ORAL_TABLET | Freq: Every day | ORAL | Status: DC
  Administered 2020-03-22 – 2020-03-24 (×3): 325 mg via ORAL
  Filled 2020-03-22 (×3): qty 1

## 2020-03-22 MED ORDER — SODIUM CHLORIDE 0.9 % IV SOLN
100.0000 mg | Freq: Every day | INTRAVENOUS | Status: DC
  Administered 2020-03-23: 100 mg via INTRAVENOUS
  Filled 2020-03-22 (×2): qty 20

## 2020-03-22 NOTE — Consult Note (Signed)
Cardiology Consultation:   Patient ID: [name] MRN: [id number]; DOB: [birth year]  Admit date: 03/22/2020 Date of Consult: 03/22/2020  PCP:  Lajean Manes, [place]  Cardiologist:  Jenkins Rouge, MD  Advanced Practice Provider:  No care team member to display Electrophysiologist:  None   Patient Profile:   [name] is a 87 y.o. female with a hx of CAD s/p CABG, severe aortic stenosis (not a candidate for TAVR), hypertension, dyslipidemia who is being seen today for the evaluation of preoperative cardiovascular evaluation at the request of Dr. Darrick Meigs.  History of Present Illness:   [name] is in no acute distress on my interview. Denies any pain. She is somewhat uncertain of her history but is aware that she has a tight valve. She denies chest pain, shortness of breath, leg swelling, or syncope.  She was last seen by Dr. Johnsie Cancel on 11/04/19. Per that note, she was not a candidate for TAVR due to advanced dementia. She has received at least two vaccinations for Covid.  She presented to the ER overnight after a mechanical fall. She has a displaced femoral neck fracture on the left side. Cardiology is asked to provide preoperative cardiovascular evaluation for potential hip surgery.  We discussed today her elevated risk given her valve, see below. She is incidentally Covid positive but denies any symptoms to me.  Past Medical History:  Diagnosis Date  . Anemia   . Aortic stenosis    mild AS by 02/18/11  [name]  . Arthritis   . Bradycardia    Asymtomatic resolved  . Cancer (HCC)    ceervical  . Chronic kidney disease    renal insufficency, stage III  . Chronic renal disease, stage III ([place])   . Coronary artery disease    CABG 2003  . Dyslipidemia    goal LSL less then 70 but statin intolerant  . GERD (gastroesophageal reflux disease)   . Hypertension   . Hypothyroidism   . Iron deficiency anemia 7/12  . Mild AI (aortic  insufficiency)   . Mild aortic stenosis   . OP (osteoporosis) 10/2010   fosamax  . Statin intolerance     Past Surgical History:  Procedure Laterality Date  . ABDOMINAL HYSTERECTOMY    . CARDIAC CATHETERIZATION     2004  . cataracts    . COLONOSCOPY  09/2010   normal EGD abd COLONOSCOPY  . CORONARY ARTERY BYPASS GRAFT  2004   with LIMA to LAD, SVG to diagonal, SVG to OM, SOVG to PDA normal LVF  . LUMBAR LAMINECTOMY  09/23/2011   Procedure: MICRODISCECTOMY LUMBAR LAMINECTOMY;  Surgeon: Marybelle Killings, MD;  Location: [facility];  Service: Orthopedics;  Laterality: N/A;  L4-5 Decompression     Home Medications:  Prior to Admission medications   Medication Sig Start Date End Date Taking? Authorizing Provider  amLODipine (NORVASC) 5 MG tablet Take 5 mg by mouth daily.   Yes [provider]  aspirin EC 81 MG tablet Take 81 mg by mouth at bedtime.   Yes [provider]  calcium gluconate 500 MG tablet Take 1 tablet by mouth at bedtime.    Yes [provider]  carvedilol (COREG) 12.5 MG tablet Take 1 tablet (12.5 mg total) by mouth 2 (two) times daily with a meal. 04/21/16  Yes [name], MD  Cyanocobalamin (B-12 PO) Take 1 tablet by mouth daily.   Yes [provider]  donepezil (ARICEPT)  10 MG tablet Take 5 mg by mouth at bedtime. 04/17/18  Yes [provider]  ezetimibe (ZETIA) 10 MG tablet Take 10 mg by mouth at bedtime.    Yes [provider]  ferrous sulfate 325 (65 FE) MG tablet Take 325 mg by mouth at bedtime.   Yes [provider]  levothyroxine (SYNTHROID, LEVOTHROID) 75 MCG tablet Take 75 mcg by mouth at bedtime.   Yes [provider]  losartan (COZAAR) 25 MG tablet Take 25 mg by mouth at bedtime.  04/28/15  Yes [provider]  Multiple Vitamin (MULTIVITAMIN WITH MINERALS) TABS tablet Take 1 tablet by mouth daily.   Yes [provider]  Vitamin D, Ergocalciferol, (DRISDOL) 50000 UNITS CAPS Take  50,000 Units by mouth every Monday.    Yes [provider]    Inpatient Medications: Scheduled Meds: . aspirin EC  81 mg Oral QHS  . calcium gluconate  1 tablet Oral QHS  . carvedilol  12.5 mg Oral BID WC  . donepezil  5 mg Oral QHS  . ezetimibe  10 mg Oral QHS  . ferrous sulfate  325 mg Oral QHS  . levothyroxine  75 mcg Oral QHS  . [START ON 03/23/2020] Vitamin D (Ergocalciferol)  50,000 Units Oral Q Mon   Continuous Infusions: . remdesivir 200 mg in sodium chloride 0.9% 250 mL IVPB     Followed by  . [START ON 03/23/2020] remdesivir 100 mg in NS 100 mL     PRN Meds: HYDROcodone-acetaminophen, morphine injection  Allergies:    Allergies  Allergen Reactions  . Aleve [Naproxen Sodium] Hives  . Excedrin Extra Strength [Aspirin-Acetaminophen-Caffeine] Nausea And Vomiting  . Statins Other (See Comments)    No energy, very weak  . Vytorin [Ezetimibe-Simvastatin] Other (See Comments)    Fatigue     Social History:   Social History   Socioeconomic History  . Marital status: Widowed    Spouse name: Not on file  . Number of children: Not on file  . Years of education: Not on file  . Highest education level: Not on file  Occupational History  . Not on file  Tobacco Use  . Smoking status: Never Smoker  . Smokeless tobacco: Never Used  Substance and Sexual Activity  . Alcohol use: No  . Drug use: No  . Sexual activity: Not on file  Other Topics Concern  . Not on file  Social History Narrative  . Not on file   Social Determinants of Health   Financial Resource Strain: Not on file  Food Insecurity: Not on file  Transportation Needs: Not on file  Physical Activity: Not on file  Stress: Not on file  Social Connections: Not on file  Intimate Partner Violence: Not on file    Family History:    Family History  Problem Relation Age of Onset  . Heart Problems Mother   . Asthma Father      ROS:  Please see the history of present illness.  Constitutional:  Negative for chills, fever, night sweats, unintentional weight loss  HENT: Negative for ear pain and hearing loss.   Eyes: Negative for loss of vision and eye pain.  Respiratory: Negative for cough, sputum, wheezing.   Cardiovascular: See HPI. Gastrointestinal: Negative for abdominal pain, melena, and hematochezia.  Genitourinary: Negative for dysuria and hematuria.  Musculoskeletal: Positive for falls, left leg pain Skin: Negative for itching and rash.  Neurological: Negative for focal weakness, focal sensory changes and loss of consciousness.  Endo/Heme/Allergies: Does not bruise/bleed easily.  All other ROS reviewed and negative.     Physical Exam/Data:   Vitals:   03/22/20 0324 03/22/20 0333 03/22/20 0500  BP:  131/68 123/75  Pulse:  85 79  Resp:  16 18  Temp:  98.5 F (36.9 C)   TempSrc:  Oral   SpO2:  100% 100%  Weight: 51.3 kg    Height: 5' (1.524 m)     No intake or output data in the 24 hours ending 03/22/20 1000 Last 3 Weights 03/22/2020 11/04/2019 10/04/2018  Weight (lbs) 113 lb 90 lb 96 lb  Weight (kg) 51.256 kg 40.824 kg 43.545 kg     Body mass index is 22.07 kg/m.  General:  Frail, elderly woman, in no acute distress, lying flat in bed HEENT: normal Lymph: no adenopathy Neck: no JVD appreciated Endocrine:  No thryomegaly Vascular: No carotid bruits; RA pulses 2+ bilaterally Cardiac:  normal S1, S2; RRR; 3/6 late peaking systolic murmur Lungs:  clear to auscultation bilaterally, no wheezing, rhonchi or rales  Abd: soft, nontender, no hepatomegaly  Ext: no edema Musculoskeletal:  L leg rotated Skin: warm and dry  Neuro:  no focal abnormalities noted Psych:  Normal affect   EKG:  The EKG was personally reviewed and demonstrates:  SR with frequent PACs, intermittent pattern of atrial bigeminy Telemetry:  Telemetry was personally reviewed and demonstrates:  Sinus rhythm with occasional PACs, brief episode of SVT this AM, self resolved.  Relevant CV  Studies: Echo 11/28/19 1. The aortic valve is abnormal. There is severe calcifcation of the  aortic valve. Aortic valve regurgitation is mild. Severe aortic valve  stenosis. Aortic valve area, by VTI measures 0.40 cm. Aortic valve mean  gradient measures 53.0 mmHg. Aortic valve  Vmax measures 4.16 m/s.  2. Left ventricular ejection fraction, by estimation, is 55 to 60%. The  left ventricle has normal function. The left ventricle has no regional  wall motion abnormalities. Left ventricular diastolic parameters are  consistent with Grade II diastolic  dysfunction (pseudonormalization). Elevated left ventricular end-diastolic  pressure. The average left ventricular global longitudinal strain is -20.1  %. The global longitudinal strain is normal.  3. Right ventricular systolic function is normal. The right ventricular  size is normal. There is mildly elevated pulmonary artery systolic  pressure. The estimated right ventricular systolic pressure is 65.7 mmHg.  4. Left atrial size was mildly dilated.  5. Right atrial size was mildly dilated.  6. The mitral valve is grossly normal. Mild to moderate mitral valve  regurgitation. No evidence of mitral stenosis.  7. The inferior vena cava is dilated in size with >50% respiratory  variability, suggesting right atrial pressure of 8 mmHg.   Comparison(s): A prior study was performed on 04/30/19. Prior images  reviewed side by side. Increase in systolic mean gradient through aortic  valve, no other significant changes.   Laboratory Data:  High Sensitivity Troponin:  No results for input(s): TROPONINIHS in the last 720 hours.   Chemistry Recent Labs  Lab 03/22/20 0425  NA 136  K 4.1  CL 103  CO2 23  GLUCOSE 105*  BUN 20  CREATININE 1.72*  CALCIUM 9.3  GFRNONAA 29*  ANIONGAP 10    No results for input(s): PROT, ALBUMIN, AST, ALT, ALKPHOS, BILITOT in the last 168 hours. Hematology Recent Labs  Lab 03/22/20 0425  WBC 8.5  RBC  3.33*  HGB 10.2*  HCT 31.8*  MCV 95.5  MCH 30.6  MCHC 32.1  RDW 13.9  PLT 224   BNPNo results for input(s): BNP, PROBNP in the last 168 hours.  DDimer  Recent Labs  Lab 03/22/20 0425  DDIMER 10.18*     Radiology/Studies:  DG Chest 1 View  Result Date: 03/22/2020 CLINICAL DATA:  Left hip pain after fall EXAM: CHEST  1 VIEW COMPARISON:  09/13/2011 FINDINGS: Cardiomegaly.  Prior CABG. Prominent interstitial markings without Kerley lines, effusion, or air bronchogram. No visible fracture. IMPRESSION: Cardiomegaly and borderline vascular congestion. Electronically Signed   By: Monte Fantasia M.D.   On: 03/22/2020 04:32   DG Knee Left Port  Result Date: 03/22/2020 CLINICAL DATA:  LEFT leg pain following fall. LEFT femoral neck fracture. EXAM: PORTABLE LEFT KNEE - 1-2 VIEW COMPARISON:  None. FINDINGS: There is no evidence of fracture, dislocation or joint effusion. Moderate tricompartmental degenerative changes are present. No focal bony lesions are identified. IMPRESSION: 1. No evidence of acute abnormality. 2. Moderate tricompartmental degenerative changes. Electronically Signed   By: Margarette Canada M.D.   On: 03/22/2020 08:56   DG Hip Unilat W or Wo Pelvis 2-3 Views Left  Result Date: 03/22/2020 CLINICAL DATA:  Left hip pain after fall EXAM: DG HIP (WITH OR WITHOUT PELVIS) 2-3V LEFT COMPARISON:  None. FINDINGS: Displaced left femoral neck fracture with varus angulation. Generalized osteopenia. No visible pelvic ring fracture. Advanced lumbar spine degeneration with dextroscoliosis where covered. IMPRESSION: Displaced left femoral neck fracture. Electronically Signed   By: Monte Fantasia M.D.   On: 03/22/2020 04:30     Assessment and Plan:   Preoperative cardiovascular evaluation: Patient has known severe aortic stenosis and is not a candidate for TAVR. This puts her at high risk for surgery, but this high risk cannot be mitigated. Given potential morbidity and mortality without surgery,  defer decision on how to proceed to surgeons/anesthesia, primary team, and patient (with her family). If surgery is planned, would attempt to use the lowest amount of anesthesia that is safe. She is preload dependent, avoid hypotension. She has a normal EF with grade 2 diastolic dysfunction.  Severe aortic stenosis: -echo as above. AVA 0.4, mean gradient 53 mmHg, peak velocity 4.16 m/s -deemed not a candidate for TAVR by Dr. Johnsie Cancel -denies symptoms  CAD with CABG in 2004 -continue aspirin 81 mg, ezetimibe -denies symptoms  Covid positive on testing -denies symptoms -had at least two vaccinations -treatment per primary team  Hypertension: -on amlodipine 5 mg daily, carvedilol 12.5 mg BID, losartan 25 mg daily as an outpatient -losartan held while admitted. Blood pressure currently well controlled. Avoid hypotension. Would be reasonable to let her blood pressure run slightly higher perioperatively. I will hold her amlodipine this morning.  We are available with questions and will follow up with her postoperatively.  For questions or updates, please contact [facility] Please consult [email or web address] for contact info under    Signed, Buford Dresser, MD  03/22/2020 10:00 AM

## 2020-03-22 NOTE — ED Provider Notes (Signed)
[facility] DEPT Provider Note   CSN: [id number] Arrival date & time: 03/22/20  0315     History Chief Complaint  Patient presents with  . Fall  . Hip Pain    [name] is a 87 y.o. female.  Patient is an 87 year old female with past medical history of coronary artery disease with CABG, hypertension, GERD, hypothyroidism.  Patient presents today for evaluation of a fall.  Patient states she tripped over the cat this evening and landed on the floor, injuring her left hip.  She was unable to ambulate and brought here by EMS.  She denies other injury.  Pain is worse with movement and palpation.  It is relieved with rest.  The history is provided by the patient.  Hip Pain       Past Medical History:  Diagnosis Date  . Anemia   . Aortic stenosis    mild AS by 02/18/11  [name]  . Arthritis   . Bradycardia    Asymtomatic resolved  . Cancer (HCC)    ceervical  . Chronic kidney disease    renal insufficency, stage III  . Chronic renal disease, stage III ([place])   . Coronary artery disease    CABG 2003  . Dyslipidemia    goal LSL less then 70 but statin intolerant  . GERD (gastroesophageal reflux disease)   . Hypertension   . Hypothyroidism   . Iron deficiency anemia 7/12  . Mild AI (aortic insufficiency)   . Mild aortic stenosis   . OP (osteoporosis) 10/2010   fosamax  . Statin intolerance     Patient Active Problem List   Diagnosis Date Noted  . Coronary atherosclerosis of native coronary artery 04/23/2013  . Essential hypertension, benign 04/23/2013  . Pure hypercholesterolemia 04/23/2013  . Aortic stenosis   . Spinal stenosis, lumbar 09/23/2011    Class: Diagnosis of    Past Surgical History:  Procedure Laterality Date  . ABDOMINAL HYSTERECTOMY    . CARDIAC CATHETERIZATION     2004  . cataracts    . COLONOSCOPY  09/2010   normal EGD abd COLONOSCOPY  . CORONARY ARTERY BYPASS GRAFT  2004   with LIMA to LAD, SVG to  diagonal, SVG to OM, SOVG to PDA normal LVF  . LUMBAR LAMINECTOMY  09/23/2011   Procedure: MICRODISCECTOMY LUMBAR LAMINECTOMY;  Surgeon: Marybelle Killings, MD;  Location: [facility];  Service: Orthopedics;  Laterality: N/A;  L4-5 Decompression     OB History   No obstetric history on file.     Family History  Problem Relation Age of Onset  . Heart Problems Mother   . Asthma Father     Social History   Tobacco Use  . Smoking status: Never Smoker  . Smokeless tobacco: Never Used  Substance Use Topics  . Alcohol use: No  . Drug use: No    Home Medications Prior to Admission medications   Medication Sig Start Date End Date Taking? Authorizing Provider  amLODipine (NORVASC) 5 MG tablet Take 5 mg by mouth daily.    [provider]  aspirin EC 81 MG tablet Take 81 mg by mouth at bedtime.    [provider]  calcium gluconate 500 MG tablet Take 1 tablet by mouth at bedtime.     [provider]  carvedilol (COREG) 12.5 MG tablet Take 1 tablet (12.5 mg total) by mouth 2 (two) times daily with a meal. 04/21/16   [name], MD  Cyanocobalamin (B-12 PO) Take by mouth daily.    [provider]  donepezil (ARICEPT) 10 MG tablet Take 5 mg by mouth at bedtime. 04/17/18   [provider]  ezetimibe (ZETIA) 10 MG tablet Take 10 mg by mouth at bedtime.     [provider]  ferrous sulfate 325 (65 FE) MG tablet Take 325 mg by mouth at bedtime.    [provider]  ibuprofen (ADVIL,MOTRIN) 200 MG tablet Take 200 mg by mouth every 6 (six) hours as needed for headache or moderate pain.     [provider]  levothyroxine (SYNTHROID, LEVOTHROID) 75 MCG tablet Take 75 mcg by mouth at bedtime.    [provider]  losartan (COZAAR) 25 MG tablet Take 25 mg by mouth at bedtime.  04/28/15   [provider]  Vitamin D, Ergocalciferol, (DRISDOL) 50000 UNITS CAPS Take 50,000 Units by mouth every Monday.     [provider]     Allergies    Aleve [naproxen sodium], Excedrin extra strength [aspirin-acetaminophen-caffeine], Statins, and Vytorin [ezetimibe-simvastatin]  Review of Systems   Review of Systems  All other systems reviewed and are negative.   Physical Exam Updated Vital Signs BP 131/68 (BP Location: Left Arm)   Pulse 85   Temp 98.5 F (36.9 C) (Oral)   Resp 16   Ht 5' (1.524 m)   Wt 51.3 kg   SpO2 100%   BMI 22.07 kg/m   Physical Exam Vitals and nursing note reviewed.  Constitutional:      General: She is not in acute distress.    Appearance: She is well-developed and well-nourished. She is not diaphoretic.  HENT:     Head: Normocephalic and atraumatic.  Cardiovascular:     Rate and Rhythm: Normal rate and regular rhythm.     Heart sounds: No murmur heard. No friction rub. No gallop.   Pulmonary:     Effort: Pulmonary effort is normal. No respiratory distress.     Breath sounds: Normal breath sounds. No wheezing.  Abdominal:     General: Bowel sounds are normal. There is no distension.     Palpations: Abdomen is soft.     Tenderness: There is no abdominal tenderness.  Musculoskeletal:        General: Normal range of motion.     Cervical back: Normal range of motion and neck supple.     Comments: There is tenderness to palpation over the left lateral hip.  The leg appears somewhat shorter than the right, but no rotation.  DP pulses are easily palpable and motor and sensation are intact at the entire foot.  Skin:    General: Skin is warm and dry.  Neurological:     Mental Status: She is alert and oriented to person, place, and time.     ED Results / Procedures / Treatments   Labs (all labs ordered are listed, but only abnormal results are displayed) Labs Reviewed - No data to display  EKG EKG Interpretation  Date/Time:  Sunday March 22 2020 03:48:07 EST Ventricular Rate:  85 PR Interval:    QRS Duration: 88 QT Interval:  404 QTC Calculation: 436 R Axis:   87 Text  Interpretation: Sinus rhythm Supraventricular bigeminy Borderline right axis deviation LVH with secondary repolarization abnormality Confirmed by Veryl Speak [phone]) on 03/22/2020 3:59:26 AM   Radiology No results found.  Procedures Procedures   Medications Ordered in ED Medications - No data to display  ED Course  I  have reviewed the triage vital signs and the nursing notes.  Pertinent labs & imaging results that were available during my care of the patient were reviewed by me and considered in my medical decision making (see chart for details).    MDM Rules/Calculators/A&P  Patient's x-ray show a displaced femoral neck fracture on the left.  This finding was discussed with Dr. Rolena Infante from orthopedic surgery.  Patient to be made n.p.o. and will be seen by orthopedics today.  Patient to be admitted by the hospitalist service.  I spoke with Dr. Alcario Drought who agrees to admit.  Patient's laboratory studies otherwise unremarkable.  Patient offered pain medication, however has declined.  Final Clinical Impression(s) / ED Diagnoses Final diagnoses:  None    Rx / DC Orders ED Discharge Orders    None       Veryl Speak, MD 03/22/20 [phone]

## 2020-03-22 NOTE — ED Notes (Signed)
Pt family updated on POC.

## 2020-03-22 NOTE — Plan of Care (Addendum)
  Problem: Education: Goal: Knowledge of risk factors and measures for prevention of condition will improve Outcome: Not Progressing   Problem: Coping: Goal: Psychosocial and spiritual needs will be supported Outcome: Not Progressing   Problem: Respiratory: Goal: Will maintain a patent airway Outcome: Not Progressing Goal: Complications related to the disease process, condition or treatment will be avoided or minimized Outcome: Not Progressing   

## 2020-03-22 NOTE — Progress Notes (Addendum)
Subjective: Patient admitted this morning, see detailed H&P by Dr Alcario Drought.  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  She tripped over the cat landed on the floor injured left hip.  She was unable to ambulate and to have difficulty with movement.  In the ED she was found to have left femoral neck fracture.  Orthopedic surgery was consulted. This morning patient's SARS-CoV-2 RT-PCR came back positive. Chest x-ray showed no acute infiltrates. Patient is not requiring oxygen, her O2 sats is 100% on room air Patient says she does not remember getting vaccine for COVID-19.   Vitals:   03/22/20 0333 03/22/20 0500  BP: 131/68 123/75  Pulse: 85 79  Resp: 16 18  Temp: 98.5 F (36.9 C)   SpO2: 100% 100%      A/P COVID-19 positive-patient does not have any symptoms of Covid.  She is not short of breath.  Not requiring oxygen.  Chest x-ray is unremarkable.  Will start patient on remdesivir for 3-day treatment per protocol.  I do not think patient needs steroids at this time.  Will obtain CRP, D-dimer, ferritin.   Left femoral neck fracture-orthopedic surgery has been consulted.  Patient is n.p.o.  Severe aortic stenosis-patient has normal EF, she is felt not to be a candidate for TAVR due to dementia.  Cardiology has been consulted for preop clearance.  Mild dementia-she is alert oriented x2 at baseline.  Acute kidney injury versus CKD stage IIIb/IV-patient's creatinine today is 1.72, previous creatinine from 2013 was 1.3. Likely her new baseline.  Will follow creatinine in a.m.  AR[facility] is currently on hold.  Try to call family, unable to contact patient's son.  [place] [facility] Hospitalist [phone]

## 2020-03-22 NOTE — ED Notes (Signed)
Pt transported to xray 

## 2020-03-22 NOTE — ED Triage Notes (Signed)
Pt BIB [facility] from home for witnessed mechanical fall. No LOC, no head trauma. No blood thinners. C/o left hip pain. No visible shortening. Alzheimer's, Ax2 at baseline.  BP 141/78 to 127/78  RR 18 HR 80s CBG 110

## 2020-03-22 NOTE — Consult Note (Signed)
Reason for Consult: Left displaced femoral neck fracture Referring Physician: ED  [name] is an 87 y.o. female.  HPI: This is a very pleasant 87 year old female who was admitted due to a left displaced femoral neck fracture.  She has a notable past medical history for aortic stenosis coronary artery disease she had a CABG back in 2003 chronic renal disease and was just recently tested positive for Covid.  She was at her house yesterday morning when she tripped over her cat and landed on a concrete floor landing directly on that left hip.  She immediately had pain and overall disfiguration of her left hip and leg.  She was brought to the hospital yesterday morning they did x-rays.  Found a displaced femoral neck fracture.  She has been admitted to hospitalist for a possible surgical intervention to left hip.  She is also being observed by cardiology due to significant cardiac history.  Patient states pain is minimal in the left hip no more than a 3 or 4 while laying in bed.  She is resting comfortably in bed, having conversation with me no significant distress.  Past Medical History:  Diagnosis Date  . Anemia   . Aortic stenosis    mild AS by 02/18/11  [name]  . Arthritis   . Bradycardia    Asymtomatic resolved  . Cancer (HCC)    ceervical  . Chronic kidney disease    renal insufficency, stage III  . Chronic renal disease, stage III ([place])   . Coronary artery disease    CABG 2003  . Dyslipidemia    goal LSL less then 70 but statin intolerant  . GERD (gastroesophageal reflux disease)   . Hypertension   . Hypothyroidism   . Iron deficiency anemia 7/12  . Mild AI (aortic insufficiency)   . Mild aortic stenosis   . OP (osteoporosis) 10/2010   fosamax  . Statin intolerance     Past Surgical History:  Procedure Laterality Date  . ABDOMINAL HYSTERECTOMY    . CARDIAC CATHETERIZATION     2004  . cataracts    . COLONOSCOPY  09/2010   normal EGD abd COLONOSCOPY  . CORONARY ARTERY  BYPASS GRAFT  2004   with LIMA to LAD, SVG to diagonal, SVG to OM, SOVG to PDA normal LVF  . LUMBAR LAMINECTOMY  09/23/2011   Procedure: MICRODISCECTOMY LUMBAR LAMINECTOMY;  Surgeon: Marybelle Killings, MD;  Location: [facility];  Service: Orthopedics;  Laterality: N/A;  L4-5 Decompression    Family History  Problem Relation Age of Onset  . Heart Problems Mother   . Asthma Father     Social History:  reports that she has never smoked. She has never used smokeless tobacco. She reports that she does not drink alcohol and does not use drugs.  Allergies:  Allergies  Allergen Reactions  . Aleve [Naproxen Sodium] Hives  . Excedrin Extra Strength [Aspirin-Acetaminophen-Caffeine] Nausea And Vomiting  . Statins Other (See Comments)    No energy, very weak  . Vytorin [Ezetimibe-Simvastatin] Other (See Comments)    Fatigue     Medications: I have reviewed the patient's current medications.  Results for orders placed or performed during the hospital encounter of 03/22/20 (from the past 48 hour(s))  Basic metabolic panel     Status: Abnormal   Collection Time: 03/22/20  4:25 AM  Result Value Ref Range   Sodium 136 135 - 145 mmol/L   Potassium 4.1 3.5 - 5.1 mmol/L   Chloride 103  98 - 111 mmol/L   CO2 23 22 - 32 mmol/L   Glucose, Bld 105 (H) 70 - 99 mg/dL    Comment: Glucose reference range applies only to samples taken after fasting for at least 8 hours.   BUN 20 8 - 23 mg/dL   Creatinine, Ser 1.72 (H) 0.44 - 1.00 mg/dL   Calcium 9.3 8.9 - 10.3 mg/dL   GFR, Estimated 29 (L) >60 mL/min    Comment: (NOTE) Calculated using the CKD-EPI Creatinine Equation (2021)    Anion gap 10 5 - 15    Comment: Performed at [facility], [place] [address], [place], [place] [address]  CBC with Differential     Status: Abnormal   Collection Time: 03/22/20  4:25 AM  Result Value Ref Range   WBC 8.5 4.0 - 10.5 K/uL   RBC 3.33 (L) 3.87 - 5.11 MIL/uL   Hemoglobin 10.2 (L) 12.0 - 15.0 g/dL   HCT 31.8  (L) 36.0 - 46.0 %   MCV 95.5 80.0 - 100.0 fL   MCH 30.6 26.0 - 34.0 pg   MCHC 32.1 30.0 - 36.0 g/dL   RDW 13.9 11.5 - 15.5 %   Platelets 224 150 - 400 K/uL   nRBC 0.0 0.0 - 0.2 %   Neutrophils Relative % 73 %   Neutro Abs 6.3 1.7 - 7.7 K/uL   Lymphocytes Relative 16 %   Lymphs Abs 1.4 0.7 - 4.0 K/uL   Monocytes Relative 7 %   Monocytes Absolute 0.6 0.1 - 1.0 K/uL   Eosinophils Relative 2 %   Eosinophils Absolute 0.2 0.0 - 0.5 K/uL   Basophils Relative 1 %   Basophils Absolute 0.1 0.0 - 0.1 K/uL   Immature Granulocytes 1 %   Abs Immature Granulocytes 0.04 0.00 - 0.07 K/uL    Comment: Performed at [facility], [place] [address] [place], [place] [address]  Protime-INR     Status: None   Collection Time: 03/22/20  4:25 AM  Result Value Ref Range   Prothrombin Time 13.3 11.4 - 15.2 seconds   INR 1.1 0.8 - 1.2    Comment: (NOTE) INR goal varies based on device and disease states. Performed at [facility], [place] [address] [place], [place] [address]   D-dimer, quantitative     Status: Abnormal   Collection Time: 03/22/20  4:25 AM  Result Value Ref Range   D-Dimer, Quant 10.18 (H) 0.00 - 0.50 ug/mL-FEU    Comment: (NOTE) At the manufacturer cut-off value of 0.5 g/mL FEU, this assay has a negative predictive value of 95-100%.This assay is intended for use in conjunction with a clinical pretest probability (PTP) assessment model to exclude pulmonary embolism (PE) and deep venous thrombosis (DVT) in outpatients suspected of PE or DVT. Results should be correlated with clinical presentation. Performed at [facility], [place] [address], [place], [place] [address]   Resp Panel by RT-PCR (Flu A&B, Covid) Nasopharyngeal Swab     Status: Abnormal   Collection Time: 03/22/20  5:29 AM   Specimen: Nasopharyngeal Swab; Nasopharyngeal(NP) swabs in vial transport medium  Result Value Ref Range   SARS Coronavirus 2 by RT PCR POSITIVE (A)  NEGATIVE    Comment: RESULT CALLED TO, READ BACK BY AND VERIFIED WITH: CLAPP,S RN @0729  ON 03/22/20 JACKSON,K (NOTE) SARS-CoV-2 target nucleic acids are DETECTED.  The SARS-CoV-2 RNA is generally detectable in upper respiratory specimens during the acute phase of infection. Positive results are  indicative of the presence of the identified virus, but do not rule out bacterial infection or co-infection with other pathogens not detected by the test. Clinical correlation with patient history and other diagnostic information is necessary to determine patient infection status. The expected result is Negative.  Fact Sheet for Patients: EntrepreneurPulse.com.au  Fact Sheet for Healthcare Providers: IncredibleEmployment.be  This test is not yet approved or cleared by the [place] FDA and  has been authorized for detection and/or diagnosis of SARS-CoV-2 by FDA under an Emergency Use Authorization (EUA).  This EUA will remain in effect (meaning this test can  be used) for the duration of  the COVID-19 declaration under Section 564(b)(1) of the Act, 21 U.S.C. section 360bbb-3(b)(1), unless the authorization is terminated or revoked sooner.     Influenza A by PCR NEGATIVE NEGATIVE   Influenza B by PCR NEGATIVE NEGATIVE    Comment: (NOTE) The Xpert Xpress SARS-CoV-2/FLU/RSV plus assay is intended as an aid in the diagnosis of influenza from Nasopharyngeal swab specimens and should not be used as a sole basis for treatment. Nasal washings and aspirates are unacceptable for Xpert Xpress SARS-CoV-2/FLU/RSV testing.  Fact Sheet for Patients: EntrepreneurPulse.com.au  Fact Sheet for Healthcare Providers: IncredibleEmployment.be  This test is not yet approved or cleared by the [place] FDA and has been authorized for detection and/or diagnosis of SARS-CoV-2 by FDA under an Emergency Use Authorization (EUA). This  EUA will remain in effect (meaning this test can be used) for the duration of the COVID-19 declaration under Section 564(b)(1) of the Act, 21 U.S.C. section 360bbb-3(b)(1), unless the authorization is terminated or revoked.  Performed at [facility], [place] [address], [place],  [address]     DG Chest 1 View  Result Date: 03/22/2020 CLINICAL DATA:  Left hip pain after fall EXAM: CHEST  1 VIEW COMPARISON:  09/13/2011 FINDINGS: Cardiomegaly.  Prior CABG. Prominent interstitial markings without Kerley lines, effusion, or air bronchogram. No visible fracture. IMPRESSION: Cardiomegaly and borderline vascular congestion. Electronically Signed   By: Monte Fantasia M.D.   On: 03/22/2020 04:32   DG Knee Left Port  Result Date: 03/22/2020 CLINICAL DATA:  LEFT leg pain following fall. LEFT femoral neck fracture. EXAM: PORTABLE LEFT KNEE - 1-2 VIEW COMPARISON:  None. FINDINGS: There is no evidence of fracture, dislocation or joint effusion. Moderate tricompartmental degenerative changes are present. No focal bony lesions are identified. IMPRESSION: 1. No evidence of acute abnormality. 2. Moderate tricompartmental degenerative changes. Electronically Signed   By: Margarette Canada M.D.   On: 03/22/2020 08:56   DG Hip Unilat W or Wo Pelvis 2-3 Views Left  Result Date: 03/22/2020 CLINICAL DATA:  Left hip pain after fall EXAM: DG HIP (WITH OR WITHOUT PELVIS) 2-3V LEFT COMPARISON:  None. FINDINGS: Displaced left femoral neck fracture with varus angulation. Generalized osteopenia. No visible pelvic ring fracture. Advanced lumbar spine degeneration with dextroscoliosis where covered. IMPRESSION: Displaced left femoral neck fracture. Electronically Signed   By: Monte Fantasia M.D.   On: 03/22/2020 04:30    Review of Systems  All other systems reviewed and are negative.  Blood pressure (!) 150/79, pulse 74, temperature 98 F (36.7 C), temperature source Oral, resp. rate 20, height 5' (1.524  m), weight 51.3 kg, SpO2 99 %. Physical Exam Musculoskeletal:     Comments: Tenderness in left groin area Notable external rotation to left hip  Able to move toes NVI in left lower extremity     Assessment/Plan: Left displaced femoral  neck fracture: -Plan is for left hip surgical intervention tomorrow with Dr. Lyla Glassing.  He is planning on doing a left hip arthroplasty.  She has already been seen by cardiology for cardiac clearance for surgical intervention.  See cardiology note for clearance.  Risks and benefits of surgery were discussed with the patient today, risks include bleeding, infection, damage to ligaments tendons blood vessels nerves and bones.  She is going to be n.p.o. after midnight tonight.  Surgery is planned for sometime tomorrow late afternoon, evening.  All of this has been discussed with Dr. Lyla Glassing and he agrees to plan.  Continue with current medications for pain management at this time as needed.   Drue Novel, PA-C [facility]- Dr. Rod Can [phone] 03/22/2020, 10:52 AM

## 2020-03-22 NOTE — H&P (Signed)
History and Physical    [name]:[id number] DOB: 16-Nov-1931 DOA: 03/22/2020  PCP: Lajean Manes, MD  Patient coming from: Home  I have personally briefly reviewed patient's old medical records in [place]  Chief Complaint: Fall, hip pain  HPI: [name] is a 87 y.o. female with medical history significant of CAD s/p CABG, severe AS, mild -mod alzheimer's dementia.  Pt presents to ED for evaluation of hip pain following a fall that occurred earlier this evening.  She tripped over the cat, landed on floor, injured L hip.  No other injury.  Severe pain in L hip, inability to ambulate.  Pain worse with movement and palpation.   ED Course: L femoral neck fx.   Review of Systems: As per HPI, otherwise all review of systems negative.  Past Medical History:  Diagnosis Date  . Anemia   . Aortic stenosis    mild AS by 02/18/11  [name]  . Arthritis   . Bradycardia    Asymtomatic resolved  . Cancer (HCC)    ceervical  . Chronic kidney disease    renal insufficency, stage III  . Chronic renal disease, stage III ([place])   . Coronary artery disease    CABG 2003  . Dyslipidemia    goal LSL less then 70 but statin intolerant  . GERD (gastroesophageal reflux disease)   . Hypertension   . Hypothyroidism   . Iron deficiency anemia 7/12  . Mild AI (aortic insufficiency)   . Mild aortic stenosis   . OP (osteoporosis) 10/2010   fosamax  . Statin intolerance     Past Surgical History:  Procedure Laterality Date  . ABDOMINAL HYSTERECTOMY    . CARDIAC CATHETERIZATION     2004  . cataracts    . COLONOSCOPY  09/2010   normal EGD abd COLONOSCOPY  . CORONARY ARTERY BYPASS GRAFT  2004   with LIMA to LAD, SVG to diagonal, SVG to OM, SOVG to PDA normal LVF  . LUMBAR LAMINECTOMY  09/23/2011   Procedure: MICRODISCECTOMY LUMBAR LAMINECTOMY;  Surgeon: Marybelle Killings, MD;  Location: [facility];  Service: Orthopedics;  Laterality: N/A;  L4-5 Decompression     reports that she  has never smoked. She has never used smokeless tobacco. She reports that she does not drink alcohol and does not use drugs.  Allergies  Allergen Reactions  . Aleve [Naproxen Sodium] Hives  . Excedrin Extra Strength [Aspirin-Acetaminophen-Caffeine] Nausea And Vomiting  . Statins Other (See Comments)    No energy, very weak  . Vytorin [Ezetimibe-Simvastatin] Other (See Comments)    Fatigue     Family History  Problem Relation Age of Onset  . Heart Problems Mother   . Asthma Father      Prior to Admission medications   Medication Sig Start Date End Date Taking? Authorizing Provider  amLODipine (NORVASC) 5 MG tablet Take 5 mg by mouth daily.    [provider]  aspirin EC 81 MG tablet Take 81 mg by mouth at bedtime.    [provider]  calcium gluconate 500 MG tablet Take 1 tablet by mouth at bedtime.     [provider]  carvedilol (COREG) 12.5 MG tablet Take 1 tablet (12.5 mg total) by mouth 2 (two) times daily with a meal. 04/21/16   [name], MD  Cyanocobalamin (B-12 PO) Take by mouth daily.    [provider]  donepezil (ARICEPT) 10 MG tablet Take 5 mg by mouth at  bedtime. 04/17/18   [provider]  ezetimibe (ZETIA) 10 MG tablet Take 10 mg by mouth at bedtime.     [provider]  ferrous sulfate 325 (65 FE) MG tablet Take 325 mg by mouth at bedtime.    [provider]  ibuprofen (ADVIL,MOTRIN) 200 MG tablet Take 200 mg by mouth every 6 (six) hours as needed for headache or moderate pain.     [provider]  levothyroxine (SYNTHROID, LEVOTHROID) 75 MCG tablet Take 75 mcg by mouth at bedtime.    [provider]  losartan (COZAAR) 25 MG tablet Take 25 mg by mouth at bedtime.  04/28/15   [provider]  Vitamin D, Ergocalciferol, (DRISDOL) 50000 UNITS CAPS Take 50,000 Units by mouth every Monday.     [provider]    Physical Exam: Vitals:   03/22/20 0324 03/22/20 0333  03/22/20 0500  BP:  131/68 123/75  Pulse:  85 79  Resp:  16 18  Temp:  98.5 F (36.9 C)   TempSrc:  Oral   SpO2:  100% 100%  Weight: 51.3 kg    Height: 5' (1.524 m)      Constitutional: NAD, calm, comfortable Eyes: PERRL, lids and conjunctivae normal ENMT: Mucous membranes are moist. Posterior pharynx clear of any exudate or lesions.Normal dentition.  Neck: normal, supple, no masses, no thyromegaly Respiratory: clear to auscultation bilaterally, no wheezing, no crackles. Normal respiratory effort. No accessory muscle use.  Cardiovascular: Regular rate and rhythm, no murmurs / rubs / gallops. No extremity edema. 2+ pedal pulses. No carotid bruits.  Abdomen: no tenderness, no masses palpated. No hepatosplenomegaly. Bowel sounds positive.  Musculoskeletal: L hip TTP Skin: no rashes, lesions, ulcers. No induration Neurologic: CN 2-12 grossly intact. Sensation intact, DTR normal. Strength 5/5 in all 4.  Psychiatric: Normal judgment and insight. Alert and oriented x 2. Normal mood.    Labs on Admission: I have personally reviewed following labs and imaging studies  CBC: Recent Labs  Lab 03/22/20 0425  WBC 8.5  NEUTROABS 6.3  HGB 10.2*  HCT 31.8*  MCV 95.5  PLT 500   Basic Metabolic Panel: Recent Labs  Lab 03/22/20 0425  NA 136  K 4.1  CL 103  CO2 23  GLUCOSE 105*  BUN 20  CREATININE 1.72*  CALCIUM 9.3   GFR: Estimated Creatinine Clearance: 16.9 mL/min (A) (by C-G formula based on SCr of 1.72 mg/dL (H)). Liver Function Tests: No results for input(s): AST, ALT, ALKPHOS, BILITOT, PROT, ALBUMIN in the last 168 hours. No results for input(s): LIPASE, AMYLASE in the last 168 hours. No results for input(s): AMMONIA in the last 168 hours. Coagulation Profile: Recent Labs  Lab 03/22/20 0425  INR 1.1   Cardiac Enzymes: No results for input(s): CKTOTAL, CKMB, CKMBINDEX, TROPONINI in the last 168 hours. BNP (last 3 results) No results for input(s): PROBNP in the last  8760 hours. HbA1C: No results for input(s): HGBA1C in the last 72 hours. CBG: No results for input(s): GLUCAP in the last 168 hours. Lipid Profile: No results for input(s): CHOL, HDL, LDLCALC, TRIG, CHOLHDL, LDLDIRECT in the last 72 hours. Thyroid Function Tests: No results for input(s): TSH, T4TOTAL, FREET4, T3FREE, THYROIDAB in the last 72 hours. Anemia Panel: No results for input(s): VITAMINB12, FOLATE, FERRITIN, TIBC, IRON, RETICCTPCT in the last 72 hours. Urine analysis: No results found for: COLORURINE, APPEARANCEUR, LABSPEC, PHURINE, GLUCOSEU, HGBUR, BILIRUBINUR, KETONESUR, PROTEINUR, UROBILINOGEN, NITRITE, LEUKOCYTESUR  Radiological Exams on Admission: DG Chest 1 View  Result Date: 03/22/2020 CLINICAL DATA:  Left hip pain after fall EXAM: CHEST  1 VIEW COMPARISON:  09/13/2011 FINDINGS: Cardiomegaly.  Prior CABG. Prominent interstitial markings without Kerley lines, effusion, or air bronchogram. No visible fracture. IMPRESSION: Cardiomegaly and borderline vascular congestion. Electronically Signed   By: Monte Fantasia M.D.   On: 03/22/2020 04:32   DG Hip Unilat W or Wo Pelvis 2-3 Views Left  Result Date: 03/22/2020 CLINICAL DATA:  Left hip pain after fall EXAM: DG HIP (WITH OR WITHOUT PELVIS) 2-3V LEFT COMPARISON:  None. FINDINGS: Displaced left femoral neck fracture with varus angulation. Generalized osteopenia. No visible pelvic ring fracture. Advanced lumbar spine degeneration with dextroscoliosis where covered. IMPRESSION: Displaced left femoral neck fracture. Electronically Signed   By: Monte Fantasia M.D.   On: 03/22/2020 04:30    EKG: Independently reviewed.  Assessment/Plan Principal Problem:   Left displaced femoral neck fracture (HCC) Active Problems:   Essential hypertension, benign   Pure hypercholesterolemia   Aortic stenosis    1. L displaced femoral neck fx - 1. Hip fx pathway 2. Pain control including IV opiates per pathway 3. NPO 4. Dr. Rolena Infante to see  this AM 5. Will get cards eval this AM for pre-op clearance due to h/o severe AS 2. Severe AS - 1. Normal EF 2. Pt was felt to be not a candidate for TAVR due to dementia 3. Renal insufficiency - 1. Suspect this represents Undiagnosed CKD - pt was at baseline health until the mechanical fall this evening. 2. unfortunately last BMP in system is from 9 years ago 3. Maybe see if Dr. Felipa Eth has anything more recent? 4. Mild Dementia - 1. AAOx2 as per baseline 2. Cont home meds 5. HTN - 1. Cont home BP meds other than ARB 6. HLD - 1. Cont zetia  DVT prophylaxis: SCDs Code Status: Full Family Communication: No family in room Disposition Plan: SNF after admit Consults called: Dr. Rolena Infante called by EDP, put in message to P. Trent for cards eval this AM Admission status: Admit to inpatient  Severity of Illness: The appropriate patient status for this patient is INPATIENT. Inpatient status is judged to be reasonable and necessary in order to provide the required intensity of service to ensure the patient's safety. The patient's presenting symptoms, physical exam findings, and initial radiographic and laboratory data in the context of their chronic comorbidities is felt to place them at high risk for further clinical deterioration. Furthermore, it is not anticipated that the patient will be medically stable for discharge from the hospital within 2 midnights of admission. The following factors support the patient status of inpatient.   IP status due to hip fx.   * I certify that at the point of admission it is my clinical judgment that the patient will require inpatient hospital care spanning beyond 2 midnights from the point of admission due to high intensity of service, high risk for further deterioration and high frequency of surveillance required.*    Bertrand Vowels M. DO [facility]  How to contact the [facility] Attending or Consulting provider [place] or covering provider during  after hours [place], for this patient?  1. Check the care team in [facility] and look for a) attending/consulting [facility] provider listed and b) the [facility] team listed 2. Log into [email or web address]  Amion Physician Scheduling and messaging for groups and whole hospitals  On call and physician scheduling software for group practices, residents, hospitalists and other medical providers for call, clinic, rotation  and shift schedules. OnCall [employer] is a hospital-wide system for scheduling doctors and paging doctors on call. EasyPlot is for scientific plotting and data analysis.  [email or web address]  and use [place]'s universal password to access. If you do not have the password, please contact the hospital operator.  3. Locate the [facility] provider you are looking for under [facility] and page to a number that you can be directly reached. 4. If you still have difficulty reaching the provider, please page the [facility] (Director on Call) for the Hospitalists listed on amion for assistance.  03/22/2020, 6:23 AM

## 2020-03-23 ENCOUNTER — Encounter (HOSPITAL_COMMUNITY): Payer: Self-pay | Admitting: Internal Medicine

## 2020-03-23 ENCOUNTER — Encounter (HOSPITAL_COMMUNITY)
Admission: EM | Disposition: A | Discharge status: Home Health | Payer: Self-pay | Source: Home / Self Care | Attending: Family Medicine

## 2020-03-23 ENCOUNTER — Inpatient Hospital Stay (HOSPITAL_COMMUNITY): Payer: Medicare Other | Admitting: Certified Registered Nurse Anesthetist

## 2020-03-23 ENCOUNTER — Inpatient Hospital Stay (HOSPITAL_COMMUNITY): Payer: Medicare Other

## 2020-03-23 DIAGNOSIS — I10 Essential (primary) hypertension: Secondary | ICD-10-CM

## 2020-03-23 DIAGNOSIS — E7800 Pure hypercholesterolemia, unspecified: Secondary | ICD-10-CM

## 2020-03-23 DIAGNOSIS — I350 Nonrheumatic aortic (valve) stenosis: Secondary | ICD-10-CM

## 2020-03-23 DIAGNOSIS — Z951 Presence of aortocoronary bypass graft: Secondary | ICD-10-CM

## 2020-03-23 HISTORY — PX: HIP ARTHROPLASTY: SHX981

## 2020-03-23 LAB — SURGICAL PCR SCREEN
MRSA, PCR: NEGATIVE
Staphylococcus aureus: NEGATIVE

## 2020-03-23 SURGERY — HEMIARTHROPLASTY, HIP, DIRECT ANTERIOR APPROACH, FOR FRACTURE
Anesthesia: General | Site: Hip | Laterality: Left

## 2020-03-23 MED ORDER — ETOMIDATE 2 MG/ML IV SOLN
INTRAVENOUS | Status: DC | PRN
  Administered 2020-03-23: 16 mg via INTRAVENOUS

## 2020-03-23 MED ORDER — METOCLOPRAMIDE HCL 5 MG/ML IJ SOLN: 5.0000 mg | Freq: Three times a day (TID) | INTRAMUSCULAR | Status: DC | PRN

## 2020-03-23 MED ORDER — SODIUM CHLORIDE 0.9 % IR SOLN
Status: DC | PRN
  Administered 2020-03-23: 50 mL

## 2020-03-23 MED ORDER — TRANEXAMIC ACID-NACL 1000-0.7 MG/100ML-% IV SOLN
INTRAVENOUS | Status: DC | PRN
  Administered 2020-03-23: 1000 mg via INTRAVENOUS

## 2020-03-23 MED ORDER — CEFAZOLIN SODIUM-DEXTROSE 2-4 GM/100ML-% IV SOLN
INTRAVENOUS | Status: AC
  Filled 2020-03-23: qty 100

## 2020-03-23 MED ORDER — DEXAMETHASONE SODIUM PHOSPHATE 10 MG/ML IJ SOLN
INTRAMUSCULAR | Status: DC | PRN
  Administered 2020-03-23: 10 mg via INTRAVENOUS

## 2020-03-23 MED ORDER — ROCURONIUM BROMIDE 10 MG/ML (PF) SYRINGE
PREFILLED_SYRINGE | INTRAVENOUS | Status: DC | PRN
  Administered 2020-03-23: 50 mg via INTRAVENOUS
  Administered 2020-03-23: 10 mg via INTRAVENOUS

## 2020-03-23 MED ORDER — CEFAZOLIN SODIUM-DEXTROSE 2-3 GM-%(50ML) IV SOLR
INTRAVENOUS | Status: DC | PRN
  Administered 2020-03-23: 2 g via INTRAVENOUS

## 2020-03-23 MED ORDER — LACTATED RINGERS IV SOLN: INTRAVENOUS | Status: DC | PRN

## 2020-03-23 MED ORDER — MENTHOL 3 MG MT LOZG: 1.0000 | LOZENGE | OROMUCOSAL | Status: DC | PRN

## 2020-03-23 MED ORDER — ENOXAPARIN SODIUM 30 MG/0.3ML ~~LOC~~ SOLN
30.0000 mg | SUBCUTANEOUS | Status: DC
  Administered 2020-03-24 – 2020-03-25 (×2): 30 mg via SUBCUTANEOUS
  Filled 2020-03-23 (×2): qty 0.3

## 2020-03-23 MED ORDER — SODIUM CHLORIDE (PF) 0.9 % IJ SOLN
INTRAMUSCULAR | Status: DC | PRN
  Administered 2020-03-23: 30 mL

## 2020-03-23 MED ORDER — DOCUSATE SODIUM 100 MG PO CAPS
100.0000 mg | ORAL_CAPSULE | Freq: Two times a day (BID) | ORAL | Status: DC
  Administered 2020-03-23 – 2020-03-25 (×4): 100 mg via ORAL
  Filled 2020-03-23 (×4): qty 1

## 2020-03-23 MED ORDER — KETOROLAC TROMETHAMINE 30 MG/ML IJ SOLN
INTRAMUSCULAR | Status: AC
  Filled 2020-03-23: qty 1

## 2020-03-23 MED ORDER — ENSURE ENLIVE PO LIQD: 237.0000 mL | Freq: Two times a day (BID) | ORAL | Status: DC

## 2020-03-23 MED ORDER — ISOPROPYL ALCOHOL 70 % SOLN
Status: DC | PRN
  Administered 2020-03-23: 1 via TOPICAL

## 2020-03-23 MED ORDER — CHLORHEXIDINE GLUCONATE CLOTH 2 % EX PADS
6.0000 | MEDICATED_PAD | Freq: Every day | CUTANEOUS | Status: DC
  Administered 2020-03-23 – 2020-03-25 (×3): 6 via TOPICAL

## 2020-03-23 MED ORDER — PROPOFOL 10 MG/ML IV BOLUS
INTRAVENOUS | Status: AC
  Filled 2020-03-23: qty 20

## 2020-03-23 MED ORDER — BUPIVACAINE-EPINEPHRINE 0.25% -1:200000 IJ SOLN
INTRAMUSCULAR | Status: DC | PRN
  Administered 2020-03-23: 30 mL

## 2020-03-23 MED ORDER — ONDANSETRON HCL 4 MG/2ML IJ SOLN
INTRAMUSCULAR | Status: DC | PRN
Start: 2020-03-23 — End: 2020-03-23
  Administered 2020-03-23: 4 mg via INTRAVENOUS

## 2020-03-23 MED ORDER — EPHEDRINE SULFATE-NACL 50-0.9 MG/10ML-% IV SOSY
PREFILLED_SYRINGE | INTRAVENOUS | Status: DC | PRN
  Administered 2020-03-23: 15 mg via INTRAVENOUS

## 2020-03-23 MED ORDER — PROSOURCE PLUS PO LIQD: 30.0000 mL | Freq: Two times a day (BID) | ORAL | Status: DC

## 2020-03-23 MED ORDER — ONDANSETRON HCL 4 MG PO TABS: 4.0000 mg | ORAL_TABLET | Freq: Four times a day (QID) | ORAL | Status: DC | PRN

## 2020-03-23 MED ORDER — TRANEXAMIC ACID-NACL 1000-0.7 MG/100ML-% IV SOLN
INTRAVENOUS | Status: AC
  Filled 2020-03-23: qty 100

## 2020-03-23 MED ORDER — METHOCARBAMOL 500 MG PO TABS: 500.0000 mg | ORAL_TABLET | Freq: Four times a day (QID) | ORAL | Status: DC | PRN

## 2020-03-23 MED ORDER — IRRISEPT - 450ML BOTTLE WITH 0.05% CHG IN STERILE WATER, USP 99.95% OPTIME
TOPICAL | Status: DC | PRN
  Administered 2020-03-23: 450 mL

## 2020-03-23 MED ORDER — SUGAMMADEX SODIUM 200 MG/2ML IV SOLN
INTRAVENOUS | Status: DC | PRN
  Administered 2020-03-23: 200 mg via INTRAVENOUS

## 2020-03-23 MED ORDER — FENTANYL CITRATE (PF) 100 MCG/2ML IJ SOLN
INTRAMUSCULAR | Status: DC | PRN
  Administered 2020-03-23: 50 ug via INTRAVENOUS
  Administered 2020-03-23: 25 ug via INTRAVENOUS

## 2020-03-23 MED ORDER — LIDOCAINE 2% (20 MG/ML) 5 ML SYRINGE
INTRAMUSCULAR | Status: DC | PRN
  Administered 2020-03-23: 60 mg via INTRAVENOUS

## 2020-03-23 MED ORDER — ISOPROPYL ALCOHOL 70 % SOLN
Status: AC
  Filled 2020-03-23: qty 480

## 2020-03-23 MED ORDER — PHENOL 1.4 % MT LIQD: 1.0000 | OROMUCOSAL | Status: DC | PRN

## 2020-03-23 MED ORDER — ONDANSETRON HCL 4 MG/2ML IJ SOLN: 4.0000 mg | Freq: Four times a day (QID) | INTRAMUSCULAR | Status: DC | PRN

## 2020-03-23 MED ORDER — KETOROLAC TROMETHAMINE 30 MG/ML IJ SOLN
INTRAMUSCULAR | Status: DC | PRN
  Administered 2020-03-23: 30 mg

## 2020-03-23 MED ORDER — METOCLOPRAMIDE HCL 5 MG PO TABS: 5.0000 mg | ORAL_TABLET | Freq: Three times a day (TID) | ORAL | Status: DC | PRN

## 2020-03-23 MED ORDER — SODIUM CHLORIDE 0.9 % IR SOLN
Status: DC | PRN
  Administered 2020-03-23: 1000 mL

## 2020-03-23 MED ORDER — METHOCARBAMOL 1000 MG/10ML IJ SOLN
500.0000 mg | Freq: Four times a day (QID) | INTRAVENOUS | Status: DC | PRN
  Filled 2020-03-23: qty 5

## 2020-03-23 MED ORDER — BUPIVACAINE-EPINEPHRINE (PF) 0.25% -1:200000 IJ SOLN
INTRAMUSCULAR | Status: AC
  Filled 2020-03-23: qty 30

## 2020-03-23 MED ORDER — FENTANYL CITRATE (PF) 100 MCG/2ML IJ SOLN
INTRAMUSCULAR | Status: AC
  Filled 2020-03-23: qty 2

## 2020-03-23 SURGICAL SUPPLY — 57 items
ADH SKN CLS APL DERMABOND .7 (GAUZE/BANDAGES/DRESSINGS) ×2
APL PRP STRL LF DISP 70% ISPRP (MISCELLANEOUS) ×1
BAG SPEC THK2 15X12 ZIP CLS (MISCELLANEOUS)
BAG ZIPLOCK 12X15 (MISCELLANEOUS) IMPLANT
CHLORAPREP W/TINT 26 (MISCELLANEOUS) ×2 IMPLANT
COVER PERINEAL POST (MISCELLANEOUS) ×2 IMPLANT
COVER SURGICAL LIGHT HANDLE (MISCELLANEOUS) ×2 IMPLANT
COVER WAND RF STERILE (DRAPES) IMPLANT
DECANTER SPIKE VIAL GLASS SM (MISCELLANEOUS) ×2 IMPLANT
DERMABOND ADVANCED (GAUZE/BANDAGES/DRESSINGS) ×2
DERMABOND ADVANCED .7 DNX12 (GAUZE/BANDAGES/DRESSINGS) ×2 IMPLANT
DRAPE IMP U-DRAPE 54X76 (DRAPES) ×2 IMPLANT
DRAPE SHEET LG 3/4 BI-LAMINATE (DRAPES) ×4 IMPLANT
DRAPE STERI IOBAN 125X83 (DRAPES) ×2 IMPLANT
DRAPE U-SHAPE 47X51 STRL (DRAPES) ×4 IMPLANT
DRSG AQUACEL AG ADV 3.5X10 (GAUZE/BANDAGES/DRESSINGS) ×2 IMPLANT
DRSG TEGADERM 4X4.75 (GAUZE/BANDAGES/DRESSINGS) IMPLANT
ELECT REM PT RETURN 15FT ADLT (MISCELLANEOUS) ×4 IMPLANT
GAUZE SPONGE 2X2 8PLY STRL LF (GAUZE/BANDAGES/DRESSINGS) IMPLANT
GAUZE SPONGE 4X4 12PLY STRL (GAUZE/BANDAGES/DRESSINGS) ×2 IMPLANT
GLOVE BIOGEL M 8.0 STRL (GLOVE) ×4 IMPLANT
GLOVE SRG 8 PF TXTR STRL LF DI (GLOVE) ×1 IMPLANT
GLOVE SURG ENC MOIS LTX SZ8.5 (GLOVE) ×4 IMPLANT
GLOVE SURG UNDER POLY LF SZ8 (GLOVE) ×2
GLOVE SURG UNDER POLY LF SZ8.5 (GLOVE) ×2 IMPLANT
GOWN SPEC L3 XXLG W/TWL (GOWN DISPOSABLE) ×2 IMPLANT
GOWN STRL REUS W/ TWL LRG LVL3 (GOWN DISPOSABLE) ×1 IMPLANT
GOWN STRL REUS W/TWL LRG LVL3 (GOWN DISPOSABLE) ×2
HANDPIECE INTERPULSE COAX TIP (DISPOSABLE)
HEAD FEM UNIPOLAR 44 OD STRL (Hips) ×1 IMPLANT
HOOD PEEL AWAY FLYTE STAYCOOL (MISCELLANEOUS) ×4 IMPLANT
KIT TURNOVER KIT A (KITS) ×2 IMPLANT
MANIFOLD NEPTUNE II (INSTRUMENTS) ×2 IMPLANT
MARKER SKIN DUAL TIP RULER LAB (MISCELLANEOUS) ×2 IMPLANT
NDL SPNL 18GX3.5 QUINCKE PK (NEEDLE) ×1 IMPLANT
NEEDLE SPNL 18GX3.5 QUINCKE PK (NEEDLE) ×2 IMPLANT
PACK ANTERIOR HIP CUSTOM (KITS) ×2 IMPLANT
PENCIL SMOKE EVACUATOR (MISCELLANEOUS) ×1 IMPLANT
SAW OSC TIP CART 19.5X105X1.3 (SAW) ×2 IMPLANT
SEALER BIPOLAR AQUA 6.0 (INSTRUMENTS) ×1 IMPLANT
SET HNDPC FAN SPRY TIP SCT (DISPOSABLE) IMPLANT
SPACER DEPUY (Hips) ×1 IMPLANT
SPONGE GAUZE 2X2 STER 10/PKG (GAUZE/BANDAGES/DRESSINGS)
STEM TRI LOC BPS SZ6 W GRIPTON IMPLANT
SUT ETHIBOND NAB CT1 #1 30IN (SUTURE) ×4 IMPLANT
SUT MNCRL AB 3-0 PS2 18 (SUTURE) ×2 IMPLANT
SUT MNCRL AB 4-0 PS2 18 (SUTURE) ×2 IMPLANT
SUT MON AB 2-0 CT1 36 (SUTURE) ×4 IMPLANT
SUT STRATAFIX PDO 1 14 VIOLET (SUTURE)
SUT STRATFX PDO 1 14 VIOLET (SUTURE)
SUT VIC AB 2-0 CT1 27 (SUTURE) ×2
SUT VIC AB 2-0 CT1 TAPERPNT 27 (SUTURE) ×1 IMPLANT
SUT VLOC 180 0 24IN GS25 (SUTURE) ×2 IMPLANT
SUTURE STRATFX PDO 1 14 VIOLET (SUTURE) IMPLANT
SYR 50ML LL SCALE MARK (SYRINGE) IMPLANT
TRAY FOLEY MTR SLVR 14FR STAT (SET/KITS/TRAYS/PACK) ×1 IMPLANT
TRI LOC BPS SZ 6 W GRIPTON ×2 IMPLANT

## 2020-03-23 NOTE — Anesthesia Postprocedure Evaluation (Signed)
Anesthesia Post Note  Patient: [name]  Procedure(s) Performed: ARTHROPLASTY BIPOLAR HIP (HEMIARTHROPLASTY) anterior approach (Left Hip)     Patient location during evaluation: PACU Anesthesia Type: General Level of consciousness: awake and alert Pain management: pain level controlled Vital Signs Assessment: post-procedure vital signs reviewed and stable Respiratory status: spontaneous breathing, nonlabored ventilation and respiratory function stable Cardiovascular status: blood pressure returned to baseline and stable Postop Assessment: no apparent nausea or vomiting Anesthetic complications: no   No complications documented.  Last Vitals:  Vitals:   03/23/20 1545 03/23/20 1600  BP: 105/72 (!) 108/37  Pulse: (!) 58 (!) 59  Resp: 18 18  Temp: (!) 35.8 C (!) 35.8 C  SpO2: 98% 99%    Last Pain:  Vitals:   03/23/20 1600  TempSrc:   PainSc: 0-No pain                 Berton Butrick A.

## 2020-03-23 NOTE — Transfer of Care (Signed)
Immediate Anesthesia Transfer of Care Note  Patient: [name]  Procedure(s) Performed: Procedure(s): ARTHROPLASTY BIPOLAR HIP (HEMIARTHROPLASTY) anterior approach (Left)  Patient Location: PACU  Anesthesia Type:General  Level of Consciousness: Alert, Awake, Oriented  Airway & Oxygen Therapy: Patient Spontanous Breathing  Post-op Assessment: Report given to RN  Post vital signs: Reviewed and stable  Last Vitals:  Vitals:   03/23/20 0815 03/23/20 0935  BP: 137/67   Pulse:  77  Resp: 20   Temp: 36.9 C   SpO2: 343%     Complications: No apparent anesthesia complications

## 2020-03-23 NOTE — Anesthesia Preprocedure Evaluation (Addendum)
Anesthesia Evaluation  Patient identified by MRN, date of birth, ID band Patient awake    Reviewed: Allergy & Precautions, NPO status , Patient's Chart, lab work & pertinent test results, reviewed documented beta blocker date and time   Airway Mallampati: III  TM Distance: >3 FB Neck ROM: Full    Dental  (+) Teeth Intact, Missing, Dental Advisory Given, Poor Dentition, Chipped   Pulmonary  Covid-19 +   Pulmonary exam normal breath sounds clear to auscultation       Cardiovascular Exercise Tolerance: Poor hypertension, Pt. on medications and Pt. on home beta blockers + CAD and + CABG  + Valvular Problems/Murmurs AS and AI  Rhythm:Irregular Rate:Normal + Systolic murmurs Echo 15/40/08 1. The aortic valve is abnormal. There is severe calcifcation of the aortic valve. Aortic valve regurgitation is mild. Severe aortic valve stenosis. Aortic valve area, by VTI measures 0.40 cm. Aortic valve mean gradient measures 53.0 mmHg. Aortic valve Vmax measures 4.16 m/s.  2. Left ventricular ejection fraction, by estimation, is 55 to 60%. The left ventricle has normal function. The left ventricle has no regional wall motion abnormalities. Left ventricular diastolic parameters are consistent with Grade II diastolic dysfunction (pseudonormalization). Elevated left ventricular end-diastolic pressure. The average left ventricular global longitudinal strain is -20.1%. The global longitudinal strain is normal.  3. Right ventricular systolic function is normal. The right ventricular size is normal. There is mildly elevated pulmonary artery systolic pressure. The estimated right ventricular systolic pressure is 67.6 mmHg.  4. Left atrial size was mildly dilated.  5. Right atrial size was mildly dilated.  6. The mitral valve is grossly normal. Mild to moderate mitral valve regurgitation. No evidence of mitral stenosis.  7. The inferior vena cava is dilated  in size with >50% respiratory variability, suggesting right atrial pressure of 8 mmHg.   EKG 03/22/20 NSR, supraventricular bigeminy, LVH, Borderline RAD  Hx/o CABG x 4 2003 with normal EF   Neuro/Psych Dementia negative neurological ROS     GI/Hepatic Neg liver ROS, GERD  Medicated and Controlled,  Endo/Other  Hypothyroidism Osteoporosis  Renal/GU Renal InsufficiencyRenal disease  negative genitourinary   Musculoskeletal  (+) Arthritis , Osteoarthritis,  Left femoral neck Fx   Abdominal   Peds  Hematology  (+) anemia ,   Anesthesia Other Findings   Reproductive/Obstetrics Hx/o cervical Ca S/P hysterectomy                            Anesthesia Physical Anesthesia Plan  ASA: IV  Anesthesia Plan: General   Post-op Pain Management:    Induction: Intravenous  PONV Risk Score and Plan:   Airway Management Planned: Oral ETT  Additional Equipment: None  Intra-op Plan:   Post-operative Plan: Extubation in OR  Informed Consent: I have reviewed the patients History and Physical, chart, labs and discussed the procedure including the risks, benefits and alternatives for the proposed anesthesia with the patient or authorized representative who has indicated his/her understanding and acceptance.     Dental advisory given  Plan Discussed with: CRNA and Anesthesiologist  Anesthesia Plan Comments: (Etomidate for Induction.  Attempts at arterial line insertion unsuccessful. Will do procedure without a line given her stable BP after induction.)     Anesthesia Quick Evaluation

## 2020-03-23 NOTE — Plan of Care (Signed)
  Problem: Pain Managment: Goal: General experience of comfort will improve Outcome: Progressing   Problem: Safety: Goal: Ability to remain free from injury will improve Outcome: Progressing   Problem: Skin Integrity: Goal: Risk for impaired skin integrity will decrease Outcome: Progressing   

## 2020-03-23 NOTE — Progress Notes (Signed)
Initial Nutrition Assessment  RD working remotely.  DOCUMENTATION CODES:   Not applicable  INTERVENTION:  - will order Ensure Enlive BID, each supplement provides 350 kcal and 20 grams of protein. - will order 30 ml Prosource Plus BID, each supplement provides 100 kcal and 15 grams protein.  - complete NFPE when feasible.   NUTRITION DIAGNOSIS:   Increased nutrient needs related to post-op healing,acute illness,catabolic illness ([id number] infection) as evidenced by estimated needs.  GOAL:   Patient will meet greater than or equal to 90% of their needs  MONITOR:   PO intake,Supplement acceptance,Labs,Weight trends,Skin  REASON FOR ASSESSMENT:   Consult Hip fracture protocol  ASSESSMENT:   87 y.o. female with medical history of CAD s/p CABG, severe aortic stenosis, hypothyroidism, arthritis, anemia, dyslipidemia, GERD, stage 3 CKD, HTN, and mild to moderate Alzheimer's dementia. She presented to the ED due to experiencing hip pain after a fall in which she tripped over her cat and landed on her L hip. She was unable to ambulate after the fall.  Diet advanced from NPO to Regular yesterday at 1725 and then back to NPO at midnight.   Patient is out of the room to OR and was transported out of the room around 1230.   She has not been seen by a [place] RD at any time in the past.  Weight yesterday was 113 lb and PTA the most recently documented weight PTA was on 11/04/19 when she weighed 90 lb. Prior to that, most recent weight was on 10/04/18 when she weighed 96 lb. Weight on 03/22/20 was an outlier compared to weights as far back as 04/07/16.  Per notes: - KMMNO-17 positive as of 3/6 AM, asymptomatic - L hip fx with planned L hip arthroplasty today (3/7)   Labs reviewed; creatinine: 1.72 mg/dl, GFR: 29 ml/min. Medications reviewed; 325 mg ferrous sulfate/day, 75 mcg oral synthroid/day, 200 mg IV remdesivir x1 dose 3/6, 100 mg IV remdesivir x1 dose 3/7 and x1 dose 3/8, 50000  units drisdol every Monday starting 3/7.     NUTRITION - FOCUSED PHYSICAL EXAM:  unable to complete at this time.   Diet Order:   Diet Order            Diet NPO time specified  Diet effective ____           Diet NPO time specified Except for: Sips with Meds  Diet effective midnight           Diet regular Room service appropriate? Yes; Fluid consistency: Thin  Diet effective now                 EDUCATION NEEDS:   Not appropriate for education at this time  Skin:  Skin Assessment: Reviewed RN Assessment  Last BM:  3/5 (PTA)  Height:   Ht Readings from Last 1 Encounters:  03/22/20 5' (1.524 m)    Weight:   Wt Readings from Last 1 Encounters:  03/22/20 51.3 kg    Estimated Nutritional Needs:  Kcal:  1650-1875 kcal Protein:  82-100 grams Fluid:  >/= 2 L/day      Jarome Matin, MS, RD, LDN, CNSC Inpatient Clinical Dietitian RD pager # available in AMION  After hours/weekend pager # available in [facility]

## 2020-03-23 NOTE — Anesthesia Procedure Notes (Signed)
Procedure Name: Intubation Date/Time: 03/23/2020 12:44 PM Performed by: Gerald Leitz, CRNA Pre-anesthesia Checklist: Patient identified, Patient being monitored, Timeout performed, Emergency Drugs available and Suction available Patient Re-evaluated:Patient Re-evaluated prior to induction Oxygen Delivery Method: Circle system utilized Preoxygenation: Pre-oxygenation with 100% oxygen Induction Type: IV induction Ventilation: Mask ventilation without difficulty Laryngoscope Size: Mac and 3 Grade View: Grade I Tube type: Oral Tube size: 7.0 mm Number of attempts: 1 Placement Confirmation: ETT inserted through vocal cords under direct vision,  positive ETCO2 and breath sounds checked- equal and bilateral Secured at: 21 cm Tube secured with: Tape Dental Injury: Teeth and Oropharynx as per pre-operative assessment

## 2020-03-23 NOTE — Progress Notes (Addendum)
[facility]  PROGRESS NOTE  [name] CBJ:[id number] DOB: 06/29/1931 DOA: 03/22/2020 PCP: Lajean Manes, MD   Brief HPI:   *Patient admitted this morning, see detailed H&P by Dr Alcario Drought.  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  She tripped over the cat landed on the floor injured left hip.  She was unable to ambulate and to have difficulty with movement.  In the ED she was found to have left femoral neck fracture.  Orthopedic surgery was consulted. This morning patient's SARS-CoV-2 RT-PCR came back positive. Chest x-ray showed no acute infiltrates. Patient is not requiring oxygen, her O2 sats is 100% on room air   Subjective   Patient seen and examined, denies any complaints.  No chest pain or shortness of breath.  Plan for surgery today.   Assessment/Plan:     1. Left femoral neck fracture-orthopedic surgery has been consulted, plan for ORIF today.  DVT prophylaxis as per orthopedics. 2. COVID-19 infection-patient found to have COVID-19 positive, she did receive 2 doses of COVID-19 vaccine.  At current time she does not have any symptoms of Covid.  Chest x-ray unremarkable.  Labs obtained shows CRP 0.7.  She does have elevated D-dimer.  Will follow D-dimer level in a.m.  Patient started on remdesivir. 3. Elevated D-dimer-patient not requiring oxygen, doubt pulmonary embolism.  Will obtain venous duplex of lower extremities in AM. 4. Severe aortic stenosis-patient has normal EF, mean gradient 53 mmHg, valve area 0.4 cm, patient is not a candidate for TAVR as per cardiology due to dementia.  She is high risk for surgery. 5. Acute kidney injury versus CKD stage IIIb-patient's creatinine is 1.72, likely new baseline.  Follow BMP in am.  ARB on hold.     COVID-19 Labs  Recent Labs    03/22/20 0425  DDIMER 10.18*  FERRITIN 128  CRP 0.7     Lab Results  Component Value Date   SARSCOV2NAA POSITIVE (A) 03/22/2020     Scheduled medications:   . (feeding supplement) PROSource Plus  30 mL Oral BID BM  . [MAR Hold] aspirin EC  81 mg Oral QHS  . [MAR Hold] calcium carbonate  1 tablet Oral QHS  . [MAR Hold] carvedilol  12.5 mg Oral BID WC  . [MAR Hold] donepezil  5 mg Oral QHS  . [MAR Hold] ezetimibe  10 mg Oral QHS  . feeding supplement  237 mL Oral BID BM  . [MAR Hold] ferrous sulfate  325 mg Oral QHS  . [MAR Hold] levothyroxine  75 mcg Oral QAC breakfast  . [MAR Hold] Vitamin D (Ergocalciferol)  50,000 Units Oral Q Mon         CBG: No results for input(s): GLUCAP in the last 168 hours.  SpO2: 100 %    CBC: Recent Labs  Lab 03/22/20 0425  WBC 8.5  NEUTROABS 6.3  HGB 10.2*  HCT 31.8*  MCV 95.5  PLT 160    Basic Metabolic Panel: Recent Labs  Lab 03/22/20 0425  NA 136  K 4.1  CL 103  CO2 23  GLUCOSE 105*  BUN 20  CREATININE 1.72*  CALCIUM 9.3     Liver Function Tests: No results for input(s): AST, ALT, ALKPHOS, BILITOT, PROT, ALBUMIN in the last 168 hours.   Antibiotics: Anti-infectives (From admission, onward)   Start     Dose/Rate Route  Frequency Ordered Stop   03/23/20 1317  ceFAZolin (ANCEF) 2-4 GM/100ML-% IVPB       Note to Pharmacy: Jovita Gamma   : cabinet override      03/23/20 1317 03/24/20 0129   03/23/20 1000  [MAR Hold]  remdesivir 100 mg in sodium chloride 0.9 % 100 mL IVPB        (MAR Hold since Mon 03/23/2020 at 1233.Hold Reason: Transfer to a Procedural area.)  "Followed by" Linked Group Details   100 mg 200 mL/hr over 30 Minutes Intravenous Daily 03/22/20 0833 03/25/20 0959   03/22/20 1000  remdesivir 200 mg in sodium chloride 0.9% 250 mL IVPB       "Followed by" Linked Group Details   200 mg 580 mL/hr over 30 Minutes Intravenous Once 03/22/20 1610 03/22/20 1132       DVT prophylaxis: Per orthopedics  Code Status: Full code  Family Communication: No family at  bedside   Consultants:  Orthopedics  Cardiology  Procedures:      Objective   Vitals:   03/22/20 2137 03/23/20 0637 03/23/20 0815 03/23/20 0935  BP: (!) 100/56 137/79 137/67   Pulse: 97 (!) 56  77  Resp: 18 19 20    Temp: 97.9 F (36.6 C) (!) 97.5 F (36.4 C) 98.5 F (36.9 C)   TempSrc: Oral Oral Oral   SpO2: 97% 98% 100%   Weight:      Height:        Intake/Output Summary (Last 24 hours) at 03/23/2020 1325 Last data filed at 03/23/2020 1318 Gross per 24 hour  Intake 290 ml  Output 100 ml  Net 190 ml    03/05 1901 - 03/07 0700 In: 650 [P.O.:360] Out: 100 [Urine:100]  Filed Weights   03/22/20 0324  Weight: 51.3 kg    Physical Examination:   General-appears in no acute distress Heart-S1-S2, regular, grade 3/6 systolic murmur auscultated at aortic area Lungs-clear to auscultation bilaterally, no wheezing or crackles auscultated Abdomen-soft, nontender, no organomegaly Extremities-no edema in the lower extremities Neuro-alert, oriented x3, no focal deficit noted  Status is: Inpatient  Dispo: The patient is from: Home              Anticipated d/c is to: Skilled nursing facility              Anticipated d/c date is: 03/25/2020              Patient currently not medically stable for discharge  Barrier to discharge-plan for ORIF left hip fracture            Data Reviewed:   Recent Results (from the past 240 hour(s))  Resp Panel by RT-PCR (Flu A&B, Covid) Nasopharyngeal Swab     Status: Abnormal   Collection Time: 03/22/20  5:29 AM   Specimen: Nasopharyngeal Swab; Nasopharyngeal(NP) swabs in vial transport medium  Result Value Ref Range Status   SARS Coronavirus 2 by RT PCR POSITIVE (A) NEGATIVE Final    Comment: RESULT CALLED TO, READ BACK BY AND VERIFIED WITH: CLAPP,S RN @0729  ON 03/22/20 JACKSON,K (NOTE) SARS-CoV-2 target nucleic acids are DETECTED.  The SARS-CoV-2 RNA is generally detectable in upper respiratory specimens during the acute  phase of infection. Positive results are indicative of the presence of the identified virus, but do not rule out bacterial infection or co-infection with other pathogens not detected by the test. Clinical correlation with patient history and other diagnostic information is necessary to determine patient infection status. The expected result  is Negative.  Fact Sheet for Patients: EntrepreneurPulse.com.au  Fact Sheet for Healthcare Providers: IncredibleEmployment.be  This test is not yet approved or cleared by the [place] FDA and  has been authorized for detection and/or diagnosis of SARS-CoV-2 by FDA under an Emergency Use Authorization (EUA).  This EUA will remain in effect (meaning this test can  be used) for the duration of  the COVID-19 declaration under Section 564(b)(1) of the Act, 21 U.S.C. section 360bbb-3(b)(1), unless the authorization is terminated or revoked sooner.     Influenza A by PCR NEGATIVE NEGATIVE Final   Influenza B by PCR NEGATIVE NEGATIVE Final    Comment: (NOTE) The Xpert Xpress SARS-CoV-2/FLU/RSV plus assay is intended as an aid in the diagnosis of influenza from Nasopharyngeal swab specimens and should not be used as a sole basis for treatment. Nasal washings and aspirates are unacceptable for Xpert Xpress SARS-CoV-2/FLU/RSV testing.  Fact Sheet for Patients: EntrepreneurPulse.com.au  Fact Sheet for Healthcare Providers: IncredibleEmployment.be  This test is not yet approved or cleared by the [place] FDA and has been authorized for detection and/or diagnosis of SARS-CoV-2 by FDA under an Emergency Use Authorization (EUA). This EUA will remain in effect (meaning this test can be used) for the duration of the COVID-19 declaration under Section 564(b)(1) of the Act, 21 U.S.C. section 360bbb-3(b)(1), unless the authorization is terminated or revoked.  Performed at  [facility], [place] [address], [place], [place] [address]      Studies:  DG Chest 1 View  Result Date: 03/22/2020 CLINICAL DATA:  Left hip pain after fall EXAM: CHEST  1 VIEW COMPARISON:  09/13/2011 FINDINGS: Cardiomegaly.  Prior CABG. Prominent interstitial markings without Kerley lines, effusion, or air bronchogram. No visible fracture. IMPRESSION: Cardiomegaly and borderline vascular congestion. Electronically Signed   By: Monte Fantasia M.D.   On: 03/22/2020 04:32   DG Knee Left Port  Result Date: 03/22/2020 CLINICAL DATA:  LEFT leg pain following fall. LEFT femoral neck fracture. EXAM: PORTABLE LEFT KNEE - 1-2 VIEW COMPARISON:  None. FINDINGS: There is no evidence of fracture, dislocation or joint effusion. Moderate tricompartmental degenerative changes are present. No focal bony lesions are identified. IMPRESSION: 1. No evidence of acute abnormality. 2. Moderate tricompartmental degenerative changes. Electronically Signed   By: Margarette Canada M.D.   On: 03/22/2020 08:56   DG Hip Unilat W or Wo Pelvis 2-3 Views Left  Result Date: 03/22/2020 CLINICAL DATA:  Left hip pain after fall EXAM: DG HIP (WITH OR WITHOUT PELVIS) 2-3V LEFT COMPARISON:  None. FINDINGS: Displaced left femoral neck fracture with varus angulation. Generalized osteopenia. No visible pelvic ring fracture. Advanced lumbar spine degeneration with dextroscoliosis where covered. IMPRESSION: Displaced left femoral neck fracture. Electronically Signed   By: Monte Fantasia M.D.   On: 03/22/2020 04:30       [place]   [facility] If 7PM-7AM, please contact night-coverage at [email or web address], Office  [phone]   03/23/2020, 1:25 PM  LOS: 1 day

## 2020-03-23 NOTE — Progress Notes (Signed)
Progress Note  Patient Name: [name] Date of Encounter: 03/23/2020  [facility] Cardiologist: Jenkins Rouge, MD   Subjective   Asymtpomatic.  Unsure why she is having surgery.   Inpatient Medications    Scheduled Meds: . aspirin EC  81 mg Oral QHS  . calcium carbonate  1 tablet Oral QHS  . carvedilol  12.5 mg Oral BID WC  . donepezil  5 mg Oral QHS  . ezetimibe  10 mg Oral QHS  . ferrous sulfate  325 mg Oral QHS  . levothyroxine  75 mcg Oral QAC breakfast  . Vitamin D (Ergocalciferol)  50,000 Units Oral Q Mon   Continuous Infusions: . remdesivir 100 mg in NS 100 mL 100 mg (03/23/20 0936)   PRN Meds: HYDROcodone-acetaminophen, morphine injection   Vital Signs    Vitals:   03/22/20 2137 03/23/20 0637 03/23/20 0815 03/23/20 0935  BP: (!) 100/56 137/79 137/67   Pulse: 97 (!) 56  77  Resp: 18 19 20    Temp: 97.9 F (36.6 C) (!) 97.5 F (36.4 C) 98.5 F (36.9 C)   TempSrc: Oral Oral Oral   SpO2: 97% 98% 100%   Weight:      Height:        Intake/Output Summary (Last 24 hours) at 03/23/2020 1205 Last data filed at 03/23/2020 1700 Gross per 24 hour  Intake 240 ml  Output 100 ml  Net 140 ml   Last 3 Weights 03/22/2020 11/04/2019 10/04/2018  Weight (lbs) 113 lb 90 lb 96 lb  Weight (kg) 51.256 kg 40.824 kg 43.545 kg      Telemetry    n/a - Personally Reviewed  ECG    Sinus rhythm.  Rate 85 bpm.  Frequent PACs.  LVH - Personally Reviewed  Physical Exam   VS:  BP 137/67 (BP Location: Left Arm)   Pulse 77   Temp 98.5 F (36.9 C) (Oral)   Resp 20   Ht 5' (1.524 m)   Wt 51.3 kg   SpO2 100%   BMI 22.07 kg/m  , BMI Body mass index is 22.07 kg/m. GENERAL:  Well appearing.  No acute distress. HEENT: Pupils equal round and reactive, fundi not visualized, oral mucosa unremarkable NECK:  No jugular venous distention, waveform within normal limits, carotid upstroke brisk and symmetric, no bruits LUNGS:  Clear to auscultation bilaterally HEART:  Mostly  regular with ectopy.  PMI not displaced or sustained,S1 and S2 within normal limits, no S3, no S4, no clicks, no rubs, III/VI late-peaking systolic murmur at LUSB ABD:  Flat, positive bowel sounds normal in frequency in pitch, no bruits, no rebound, no guarding, no midline pulsatile mass, no hepatomegaly, no splenomegaly EXT:  2 plus pulses throughout, no edema, no cyanosis no clubbing SKIN:  No rashes no nodules NEURO:  Cranial nerves II through XII grossly intact, motor grossly intact throughout PSYCH:  Pleasantly demented.  Oriented to person place but not time or situation  Labs    High Sensitivity Troponin:  No results for input(s): TROPONINIHS in the last 720 hours.    Chemistry Recent Labs  Lab 03/22/20 0425  NA 136  K 4.1  CL 103  CO2 23  GLUCOSE 105*  BUN 20  CREATININE 1.72*  CALCIUM 9.3  GFRNONAA 29*  ANIONGAP 10     Hematology Recent Labs  Lab 03/22/20 0425  WBC 8.5  RBC 3.33*  HGB 10.2*  HCT 31.8*  MCV 95.5  MCH 30.6  MCHC 32.1  RDW 13.9  PLT 224    BNPNo results for input(s): BNP, PROBNP in the last 168 hours.   DDimer  Recent Labs  Lab 03/22/20 0425  DDIMER 10.18*     Radiology    DG Chest 1 View  Result Date: 03/22/2020 CLINICAL DATA:  Left hip pain after fall EXAM: CHEST  1 VIEW COMPARISON:  09/13/2011 FINDINGS: Cardiomegaly.  Prior CABG. Prominent interstitial markings without Kerley lines, effusion, or air bronchogram. No visible fracture. IMPRESSION: Cardiomegaly and borderline vascular congestion. Electronically Signed   By: Monte Fantasia M.D.   On: 03/22/2020 04:32   DG Knee Left Port  Result Date: 03/22/2020 CLINICAL DATA:  LEFT leg pain following fall. LEFT femoral neck fracture. EXAM: PORTABLE LEFT KNEE - 1-2 VIEW COMPARISON:  None. FINDINGS: There is no evidence of fracture, dislocation or joint effusion. Moderate tricompartmental degenerative changes are present. No focal bony lesions are identified. IMPRESSION: 1. No evidence of  acute abnormality. 2. Moderate tricompartmental degenerative changes. Electronically Signed   By: Margarette Canada M.D.   On: 03/22/2020 08:56   DG Hip Unilat W or Wo Pelvis 2-3 Views Left  Result Date: 03/22/2020 CLINICAL DATA:  Left hip pain after fall EXAM: DG HIP (WITH OR WITHOUT PELVIS) 2-3V LEFT COMPARISON:  None. FINDINGS: Displaced left femoral neck fracture with varus angulation. Generalized osteopenia. No visible pelvic ring fracture. Advanced lumbar spine degeneration with dextroscoliosis where covered. IMPRESSION: Displaced left femoral neck fracture. Electronically Signed   By: Monte Fantasia M.D.   On: 03/22/2020 04:30    Cardiac Studies   Echo 11/28/19: 1. The aortic valve is abnormal. There is severe calcifcation of the  aortic valve. Aortic valve regurgitation is mild. Severe aortic valve  stenosis. Aortic valve area, by VTI measures 0.40 cm. Aortic valve mean  gradient measures 53.0 mmHg. Aortic valve  Vmax measures 4.16 m/s.  2. Left ventricular ejection fraction, by estimation, is 55 to 60%. The  left ventricle has normal function. The left ventricle has no regional  wall motion abnormalities. Left ventricular diastolic parameters are  consistent with Grade II diastolic  dysfunction (pseudonormalization). Elevated left ventricular end-diastolic  pressure. The average left ventricular global longitudinal strain is -20.1  %. The global longitudinal strain is normal.  3. Right ventricular systolic function is normal. The right ventricular  size is normal. There is mildly elevated pulmonary artery systolic  pressure. The estimated right ventricular systolic pressure is 15.1 mmHg.  4. Left atrial size was mildly dilated.  5. Right atrial size was mildly dilated.  6. The mitral valve is grossly normal. Mild to moderate mitral valve  regurgitation. No evidence of mitral stenosis.  7. The inferior vena cava is dilated in size with >50% respiratory  variability,  suggesting right atrial pressure of 8 mmHg.   Patient Profile     87 y.o. female with severe aortic stenosis not a candidate for TAVR, CAD s/p CABG, hypertension, HL, and advanced dementia admitted with hip fracture.  Indicidtally found to have COVID-19.  She is asymptomatic.   Assessment & Plan    # Severe aortic stenosis:  # Hypertension:  Mean gradient 53 mmHg, consistent with critical aortic stenosis.  Agree with allowing BP to run a little high in an effort to avoid hypotension.  Not a candidate for TAVR 2/2 dementia. Continue carvedilol.  Amlodipine and losartan on hold for now.  # CAD s/p CABG: # Hyperlipidemia:  Stable.  Not an active issue.  Continue aspirin, carvedilol, and Zetia.  #  PACs:  Noted on EKG.  Automated BP cuff reading bradycardic at times.  Suspect this is related to PACs.  Will place her on telemetry.  Continue carvedilol.        For questions or updates, please contact [place] Please consult [email or web address] for contact info under        Signed, Skeet Latch, MD  03/23/2020, 12:05 PM

## 2020-03-23 NOTE — Op Note (Signed)
OPERATIVE REPORT  SURGEON: Rod Can, MD   ASSISTANT: Cherlynn June, PA-C  PREOPERATIVE DIAGNOSIS: Displaced Left femoral neck fracture.   POSTOPERATIVE DIAGNOSIS: Displaced Left femoral neck fracture.   PROCEDURE: Left hip hemiarthroplasty, anterior approach.   IMPLANTS: DePuy Tri Lock stem, size 6, hi offset, with a -3 mm spacer and a 44 mm monopolar head ball.  ANESTHESIA:  General  ANTIBIOTICS: 2g ancef.  ESTIMATED BLOOD LOSS: 150 mL   DRAINS: None.  COMPLICATIONS: None   CONDITION: PACU - hemodynamically stable.   BRIEF CLINICAL NOTE: [name] is a 87 y.o. female with a displaced Left femoral neck fracture. The patient was admitted to the hospitalist service and underwent perioperative risk stratification and medical optimization. The risks, benefits, and alternatives to hemiarthroplasty were explained, and the patient elected to proceed.  PROCEDURE IN DETAIL: The patient was taken to the operating room and general anesthesia was induced on the hospital bed.  The patient was then positioned on the Hana table.  All bony prominences were well padded.  The hip was prepped and draped in the normal sterile surgical fashion.  A time-out was called verifying side and site of surgery. Antibiotics were given within 60 minutes of beginning the procedure.   The direct anterior approach to the hip was performed through the Hueter interval.  Lateral femoral circumflex vessels were treated with the Auqumantys. The anterior capsule was exposed and an inverted T capsulotomy was made.  Fracture hematoma was encountered and evacuated. The patient was found to have a comminuted Left subcapital femoral neck fracture.  I freshened the femoral neck cut with a saw.  I removed the femoral neck fragment.  A corkscrew was placed into the head and the head was removed.  This was passed to the back table and was measured.   Acetabular exposure was achieved.  I examined the articular  cartilage which was intact.  The labrum was intact. A 44 mm trial head was placed and found to have excellent fit.   I then gained femoral exposure taking care to protect the abductors and greater trochanter.  This was performed using standard external rotation, extension, and adduction.  The capsule was peeled off the inner aspect of the greater trochanter, taking care to preserve the short external rotators. A cookie cutter was used to enter the femoral canal, and then the femoral canal finder was used to confirm location.  I then sequentially broached up to a size 6.  Calcar planer was used on the femoral neck remnant.  I paced a -3 neck and a 36 + 1.5 head ball. The hip was reduced.  Leg lengths were checked fluoroscopically.  The hip was dislocated and trial components were removed.  I placed the real stem followed by the real spacer and head ball.  A single reduction maneuver was performed and the hip was reduced.  Fluoroscopy was used to confirm component position and leg lengths.  At 90 degrees of external rotation and extension, the hip was stable to an anterior directed force.   The wound was copiously irrigated with Irrisept solution and normal saline using pule lavage.  Marcaine solution was injected into the periarticular soft tissue.  The wound was closed in layers using #1 Vicryl and V-Loc for the fascia, 2-0 Vicryl for the subcutaneous fat, 2-0 Monocryl for the deep dermal layer, 3-0 running Monocryl subcuticular stitch and glue for the skin.  Once the glue was fully dried, an Aquacell Ag dressing was applied.  The  patient was then awakened from anesthesia and transported to the recovery room in stable condition.  Sponge, needle, and instrument counts were correct at the end of the case x2.  The patient tolerated the procedure well and there were no known complications.  Please note that a surgical assistant was a medical necessity for this procedure to perform it in a safe and expeditious  manner. Assistant was necessary to provide appropriate retraction of vital neurovascular structures, to prevent femoral fracture, and to allow for anatomic placement of the prosthesis.

## 2020-03-24 ENCOUNTER — Inpatient Hospital Stay (HOSPITAL_COMMUNITY): Payer: Medicare Other

## 2020-03-24 DIAGNOSIS — W19XXXA Unspecified fall, initial encounter: Secondary | ICD-10-CM

## 2020-03-24 LAB — CBC
HCT: 31 % — ABNORMAL LOW (ref 36.0–46.0)
Hemoglobin: 10 g/dL — ABNORMAL LOW (ref 12.0–15.0)
MCH: 30.9 pg (ref 26.0–34.0)
MCHC: 32.3 g/dL (ref 30.0–36.0)
MCV: 95.7 fL (ref 80.0–100.0)
Platelets: 198 10*3/uL (ref 150–400)
RBC: 3.24 MIL/uL — ABNORMAL LOW (ref 3.87–5.11)
RDW: 13.7 % (ref 11.5–15.5)
WBC: 9.9 10*3/uL (ref 4.0–10.5)
nRBC: 0 % (ref 0.0–0.2)

## 2020-03-24 LAB — BASIC METABOLIC PANEL
Anion gap: 10 (ref 5–15)
BUN: 31 mg/dL — ABNORMAL HIGH (ref 8–23)
CO2: 21 mmol/L — ABNORMAL LOW (ref 22–32)
Calcium: 8.6 mg/dL — ABNORMAL LOW (ref 8.9–10.3)
Chloride: 101 mmol/L (ref 98–111)
Creatinine, Ser: 1.75 mg/dL — ABNORMAL HIGH (ref 0.44–1.00)
GFR, Estimated: 28 mL/min — ABNORMAL LOW (ref >60)
Glucose, Bld: 159 mg/dL — ABNORMAL HIGH (ref 70–99)
Potassium: 4.7 mmol/L (ref 3.5–5.1)
Sodium: 132 mmol/L — ABNORMAL LOW (ref 135–145)

## 2020-03-24 LAB — D-DIMER, QUANTITATIVE: D-Dimer, Quant: 1.32 ug/mL-FEU — ABNORMAL HIGH (ref 0.00–0.50)

## 2020-03-24 MED ORDER — HYDROCODONE-ACETAMINOPHEN 5-325 MG PO TABS: 1.0000 | ORAL_TABLET | Freq: Four times a day (QID) | ORAL | 0 refills | Status: DC | PRN

## 2020-03-24 MED ORDER — CARVEDILOL 6.25 MG PO TABS
6.2500 mg | ORAL_TABLET | Freq: Two times a day (BID) | ORAL | Status: DC
Start: 2020-03-24 — End: 2020-03-25
  Administered 2020-03-24 – 2020-03-25 (×3): 6.25 mg via ORAL
  Filled 2020-03-24 (×2): qty 1

## 2020-03-24 MED ORDER — ASPIRIN 81 MG PO TBEC: 81.0000 mg | DELAYED_RELEASE_TABLET | Freq: Two times a day (BID) | ORAL | 0 refills | Status: AC

## 2020-03-24 NOTE — Progress Notes (Signed)
[facility]  PROGRESS NOTE  MACKIE GOON HWE:[id number] DOB: [birth year] DOA: 03/22/2020 PCP: Lajean Manes, MD   Brief HPI:   *Patient admitted this morning, see detailed H&P by Dr Alcario Drought.  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  She tripped over the cat landed on the floor injured left hip.  She was unable to ambulate and to have difficulty with movement.  In the ED she was found to have left femoral neck fracture.  Orthopedic surgery was consulted. This morning patient's SARS-CoV-2 RT-PCR came back positive. Chest x-ray showed no acute infiltrates. Patient is not requiring oxygen, her O2 sats is 100% on room air   Subjective   Patient seen and examined, pain well controlled.  Denies chest pain or shortness of breath.  D-dimer was elevated yesterday, repeat D-dimer today is 1.32.   Assessment/Plan:     1. Left femoral neck fracture-orthopedic surgery has been consulted, underwent left hip hemiarthroplasty per orthopedics.  DVT prophylaxis as per orthopedics. 2. COVID-19 infection-patient found to have COVID-19 positive, she did receive 2 doses of COVID-19 vaccine.  At current time she does not have any symptoms of Covid.  Chest x-ray unremarkable.  Labs obtained shows CRP 0.7.  She does have elevated D-dimer.  Will follow D-dimer level in a.m.  Patient started on remdesivir. 3. Elevated D-dimer-D-dimer was elevated above 10 yesterday.  Repeat D-dimer this morning is 1.32.  Patient not requiring oxygen, doubt pulmonary embolism.  Will obtain venous duplex of lower extremities  4. Severe aortic stenosis-patient has normal EF, mean gradient 53 mmHg, valve area 0.4 cm, patient is not a candidate for TAVR as per cardiology due to dementia.  She is high risk for surgery. 5. Acute kidney injury versus CKD stage IIIb-patient's creatinine is 1.72,  likely new baseline.  Follow BMP in am.  ARB on hold. 6. Hypertension-patient blood pressure has been stable, she has been having episodes of bradycardia in the hospital will cut down Coreg to 6.25 mg p.o. twice daily.  Her other home medications including amlodipine and losartan are on hold at this time. 7. CKD stage IIIb/IV-patient's creatinine is 1.75, likely her new baseline.  Her previous creatinine was 1.57 in December 2013.     COVID-19 Labs  Recent Labs    03/22/20 0425 03/24/20 1002  DDIMER 10.18* 1.32*  FERRITIN 128  --   CRP 0.7  --     Lab Results  Component Value Date   SARSCOV2NAA POSITIVE (A) 03/22/2020     Scheduled medications:   . aspirin EC  81 mg Oral QHS  . calcium carbonate  1 tablet Oral QHS  . carvedilol  6.25 mg Oral BID WC  . Chlorhexidine Gluconate Cloth  6 each Topical Daily  . docusate sodium  100 mg Oral BID  . donepezil  5 mg Oral QHS  . enoxaparin (LOVENOX) injection  30 mg Subcutaneous Q24H  . ezetimibe  10 mg Oral QHS  . ferrous sulfate  325 mg Oral QHS  . levothyroxine  75 mcg Oral QAC breakfast  . Vitamin D (Ergocalciferol)  50,000 Units Oral Q Mon         CBG: No results for input(s): GLUCAP in the last 168 hours.  SpO2: 100 % O2 Flow Rate (L/min): 2 L/min FiO2 (%): 100 %    CBC: Recent Labs  Lab 03/22/20 0425  03/24/20 0441  WBC 8.5 9.9  NEUTROABS 6.3  --   HGB 10.2* 10.0*  HCT 31.8* 31.0*  MCV 95.5 95.7  PLT 224 326    Basic Metabolic Panel: Recent Labs  Lab 03/22/20 0425 03/24/20 0441  NA 136 132*  K 4.1 4.7  CL 103 101  CO2 23 21*  GLUCOSE 105* 159*  BUN 20 31*  CREATININE 1.72* 1.75*  CALCIUM 9.3 8.6*     Liver Function Tests: No results for input(s): AST, ALT, ALKPHOS, BILITOT, PROT, ALBUMIN in the last 168 hours.   Antibiotics: Anti-infectives (From admission, onward)   Start     Dose/Rate Route Frequency Ordered Stop   03/23/20 1317  ceFAZolin (ANCEF) 2-4 GM/100ML-% IVPB       Note to  Pharmacy: Jovita Gamma   : cabinet override      03/23/20 1317 03/24/20 0129   03/23/20 1000  remdesivir 100 mg in sodium chloride 0.9 % 100 mL IVPB  Status:  Discontinued       "Followed by" Linked Group Details   100 mg 200 mL/hr over 30 Minutes Intravenous Daily 03/22/20 0833 03/23/20 1645   03/22/20 1000  remdesivir 200 mg in sodium chloride 0.9% 250 mL IVPB       "Followed by" Linked Group Details   200 mg 580 mL/hr over 30 Minutes Intravenous Once 03/22/20 7124 03/22/20 1132       DVT prophylaxis: Per orthopedics  Code Status: Full code  Family Communication: No family at bedside   Consultants:  Orthopedics  Cardiology  Procedures:      Objective   Vitals:   03/23/20 1945 03/23/20 2119 03/24/20 0448 03/24/20 1434  BP:  120/70 135/73 (!) 102/46  Pulse:  63 (!) 56 72  Resp:  20  18  Temp:  98 F (36.7 C) 97.8 F (36.6 C) (!) 97.5 F (36.4 C)  TempSrc:   Oral Oral  SpO2: 100% 100% 100% 100%  Weight:      Height:        Intake/Output Summary (Last 24 hours) at 03/24/2020 1443 Last data filed at 03/24/2020 1030 Gross per 24 hour  Intake 480 ml  Output 475 ml  Net 5 ml    03/06 1901 - 03/08 0700 In: 1390 [P.O.:240; I.V.:1000] Out: 600 [Urine:550]  Filed Weights   03/22/20 0324  Weight: 51.3 kg    Physical Examination:  General-appears in no acute distress Heart-S1-S2, regular, no murmur auscultated Lungs-clear to auscultation bilaterally, no wheezing or crackles auscultated Abdomen-soft, nontender, no organomegaly Extremities-no edema in the lower extremities Neuro-alert, oriented x3, no focal deficit noted  Status is: Inpatient  Dispo: The patient is from: Home              Anticipated d/c is to: Skilled nursing facility              Anticipated d/c date is: 03/25/2020              Patient currently medically stable for discharge  Barrier to discharge-awaiting placement at skilled nursing facility            Data Reviewed:    Recent Results (from the past 240 hour(s))  Resp Panel by RT-PCR (Flu A&B, Covid) Nasopharyngeal Swab     Status: Abnormal   Collection Time: 03/22/20  5:29 AM   Specimen: Nasopharyngeal Swab; Nasopharyngeal(NP) swabs in vial transport medium  Result Value Ref Range Status   SARS Coronavirus 2 by RT PCR POSITIVE (A)  NEGATIVE Final    Comment: RESULT CALLED TO, READ BACK BY AND VERIFIED WITH: CLAPP,S RN @0729  ON 03/22/20 JACKSON,K (NOTE) SARS-CoV-2 target nucleic acids are DETECTED.  The SARS-CoV-2 RNA is generally detectable in upper respiratory specimens during the acute phase of infection. Positive results are indicative of the presence of the identified virus, but do not rule out bacterial infection or co-infection with other pathogens not detected by the test. Clinical correlation with patient history and other diagnostic information is necessary to determine patient infection status. The expected result is Negative.  Fact Sheet for Patients: EntrepreneurPulse.com.au  Fact Sheet for Healthcare Providers: IncredibleEmployment.be  This test is not yet approved or cleared by the [place] FDA and  has been authorized for detection and/or diagnosis of SARS-CoV-2 by FDA under an Emergency Use Authorization (EUA).  This EUA will remain in effect (meaning this test can  be used) for the duration of  the COVID-19 declaration under Section 564(b)(1) of the Act, 21 U.S.C. section 360bbb-3(b)(1), unless the authorization is terminated or revoked sooner.     Influenza A by PCR NEGATIVE NEGATIVE Final   Influenza B by PCR NEGATIVE NEGATIVE Final    Comment: (NOTE) The Xpert Xpress SARS-CoV-2/FLU/RSV plus assay is intended as an aid in the diagnosis of influenza from Nasopharyngeal swab specimens and should not be used as a sole basis for treatment. Nasal washings and aspirates are unacceptable for Xpert Xpress  SARS-CoV-2/FLU/RSV testing.  Fact Sheet for Patients: EntrepreneurPulse.com.au  Fact Sheet for Healthcare Providers: IncredibleEmployment.be  This test is not yet approved or cleared by the [place] FDA and has been authorized for detection and/or diagnosis of SARS-CoV-2 by FDA under an Emergency Use Authorization (EUA). This EUA will remain in effect (meaning this test can be used) for the duration of the COVID-19 declaration under Section 564(b)(1) of the Act, 21 U.S.C. section 360bbb-3(b)(1), unless the authorization is terminated or revoked.  Performed at [facility], [place] [address] [place], [place] [address]   Surgical pcr screen     Status: None   Collection Time: 03/23/20 12:03 PM   Specimen: Nasal Mucosa; Nasal Swab  Result Value Ref Range Status   MRSA, PCR NEGATIVE NEGATIVE Final   Staphylococcus aureus NEGATIVE NEGATIVE Final    Comment: (NOTE) The Xpert SA Assay (FDA approved for NASAL specimens in patients 3 years of age and older), is one component of a comprehensive surveillance program. It is not intended to diagnose infection nor to guide or monitor treatment. Performed at [facility], [place] [address] [place], [place] [address]      Studies:  DG C-Arm 1-60 Min-No Report  Result Date: 03/23/2020 Fluoroscopy was utilized by the requesting physician.  No radiographic interpretation.   DG HIP OPERATIVE UNILAT W OR W/O PELVIS LEFT  Result Date: 03/23/2020 CLINICAL DATA:  Hip replacement EXAM: OPERATIVE LEFT HIP (WITH PELVIS IF PERFORMED) 1 VIEW TECHNIQUE: Fluoroscopic spot image(s) were submitted for interpretation post-operatively. COMPARISON:  None. FINDINGS: Multiple frontal images of the left hip are identified demonstrating total left hip arthroplasty. No evidence of complication on these images. IMPRESSION: Fluoroscopic guidance for left total hip arthroplasty. Electronically  Signed   By: Macy Mis M.D.   On: 03/23/2020 14:24       [place]   [facility] If 7PM-7AM, please contact night-coverage at [email or web address], Office  [phone]   03/24/2020, 2:43 PM  LOS: 2 days

## 2020-03-24 NOTE — Progress Notes (Addendum)
    Subjective:  Patient reports pain as mild.  Denies N/V/CP/SOB. Patient is resting comfortably in bed talking to her sister on the phone at time of exam  Objective:   VITALS:   Vitals:   03/23/20 1702 03/23/20 1945 03/23/20 2119 03/24/20 0448  BP: 126/66  120/70 135/73  Pulse: 61  63 (!) 56  Resp: 18  20   Temp: 97.8 F (36.6 C)  98 F (36.7 C) 97.8 F (36.6 C)  TempSrc: Oral   Oral  SpO2: 100% 100% 100% 100%  Weight:      Height:        NAD ABD soft Neurovascular intact Sensation intact distally Intact pulses distally Dorsiflexion/Plantar flexion intact Incision: dressing C/D/I   Lab Results  Component Value Date   WBC 9.9 03/24/2020   HGB 10.0 (L) 03/24/2020   HCT 31.0 (L) 03/24/2020   MCV 95.7 03/24/2020   PLT 198 03/24/2020   BMET    Component Value Date/Time   NA 132 (L) 03/24/2020 0441   K 4.7 03/24/2020 0441   CL 101 03/24/2020 0441   CO2 21 (L) 03/24/2020 0441   GLUCOSE 159 (H) 03/24/2020 0441   BUN 31 (H) 03/24/2020 0441   CREATININE 1.75 (H) 03/24/2020 0441   CALCIUM 8.6 (L) 03/24/2020 0441   GFRNONAA 28 (L) 03/24/2020 0441   GFRAA 42 (L) 09/23/2011 0756     Assessment/Plan: 1 Day Post-Op   Principal Problem:   Left displaced femoral neck fracture (HCC) Active Problems:   Essential hypertension, benign   Pure hypercholesterolemia   Aortic stenosis   Renal insufficiency   WBAT with walker DVT ppx: Lovenox, SCDs, TEDS   ASA 81mg  BID at D/C PO pain control Covid 19: Asymptomatic, Treat and isolate per current recommendations PT/OT Dispo: D/C planning     Dorothyann Peng 03/24/2020, 12:42 PM [facility] is now [employer] [address], [place], [place] [address] Phone: [phone] [email or web address] Facebook  [employer]

## 2020-03-24 NOTE — Plan of Care (Signed)
  Problem: Respiratory: Goal: Will maintain a patent airway Outcome: Progressing   Problem: Clinical Measurements: Goal: Will remain free from infection Outcome: Progressing   Problem: Nutrition: Goal: Adequate nutrition will be maintained Outcome: Progressing   Problem: Pain Managment: Goal: General experience of comfort will improve Outcome: Progressing   Problem: Safety: Goal: Ability to remain free from injury will improve Outcome: Progressing   Problem: Skin Integrity: Goal: Risk for impaired skin integrity will decrease Outcome: Progressing

## 2020-03-24 NOTE — Progress Notes (Signed)
Progress Note  Patient Name: [name] Date of Encounter: 03/24/2020  [facility] Cardiologist: Jenkins Rouge, MD   Subjective   Feeling well.  Denies any pain.  Unsure why she is in the hospital.  Inpatient Medications    Scheduled Meds: . aspirin EC  81 mg Oral QHS  . calcium carbonate  1 tablet Oral QHS  . carvedilol  6.25 mg Oral BID WC  . Chlorhexidine Gluconate Cloth  6 each Topical Daily  . docusate sodium  100 mg Oral BID  . donepezil  5 mg Oral QHS  . enoxaparin (LOVENOX) injection  30 mg Subcutaneous Q24H  . ezetimibe  10 mg Oral QHS  . ferrous sulfate  325 mg Oral QHS  . levothyroxine  75 mcg Oral QAC breakfast  . Vitamin D (Ergocalciferol)  50,000 Units Oral Q Mon   Continuous Infusions: . methocarbamol (ROBAXIN) IV     PRN Meds: HYDROcodone-acetaminophen, menthol-cetylpyridinium **OR** phenol, methocarbamol **OR** methocarbamol (ROBAXIN) IV, metoCLOPramide **OR** metoCLOPramide (REGLAN) injection, morphine injection, ondansetron **OR** ondansetron (ZOFRAN) IV   Vital Signs    Vitals:   03/23/20 1702 03/23/20 1945 03/23/20 2119 03/24/20 0448  BP: 126/66  120/70 135/73  Pulse: 61  63 (!) 56  Resp: 18  20   Temp: 97.8 F (36.6 C)  98 F (36.7 C) 97.8 F (36.6 C)  TempSrc: Oral   Oral  SpO2: 100% 100% 100% 100%  Weight:      Height:        Intake/Output Summary (Last 24 hours) at 03/24/2020 1318 Last data filed at 03/24/2020 0949 Gross per 24 hour  Intake 1340 ml  Output 575 ml  Net 765 ml   Last 3 Weights 03/22/2020 11/04/2019 10/04/2018  Weight (lbs) 113 lb 90 lb 96 lb  Weight (kg) 51.256 kg 40.824 kg 43.545 kg      Telemetry    Sinus rhythm, sinus bradycardia.  PVCs.- Personally Reviewed  ECG    Sinus rhythm.  Rate 85 bpm.  Frequent PACs.  LVH - Personally Reviewed  Physical Exam   VS:  BP 135/73 (BP Location: Left Arm)   Pulse (!) 56   Temp 97.8 F (36.6 C) (Oral)   Resp 20   Ht 5' (1.524 m)   Wt 51.3 kg   SpO2 100%   BMI  22.07 kg/m  , BMI Body mass index is 22.07 kg/m. GENERAL:  Well appearing.  No acute distress. HEENT: Pupils equal round and reactive, fundi not visualized, oral mucosa unremarkable NECK:  No jugular venous distention, waveform within normal limits, carotid upstroke brisk and symmetric, no bruits LUNGS:  Clear to auscultation bilaterally HEART:  Mostly regular with ectopy.  PMI not displaced or sustained,S1 and S2 within normal limits, no S3, no S4, no clicks, no rubs, III/VI late-peaking systolic murmur at LUSB ABD:  Flat, positive bowel sounds normal in frequency in pitch, no bruits, no rebound, no guarding, no midline pulsatile mass, no hepatomegaly, no splenomegaly EXT:  2 plus pulses throughout, no edema, no cyanosis no clubbing SKIN:  No rashes no nodules NEURO:  Cranial nerves II through XII grossly intact, motor grossly intact throughout PSYCH:  Pleasantly demented.  Oriented to person place but not time or situation  Labs    High Sensitivity Troponin:  No results for input(s): TROPONINIHS in the last 720 hours.    Chemistry Recent Labs  Lab 03/22/20 0425 03/24/20 0441  NA 136 132*  K 4.1 4.7  CL 103 101  CO2 23 21*  GLUCOSE 105* 159*  BUN 20 31*  CREATININE 1.72* 1.75*  CALCIUM 9.3 8.6*  GFRNONAA 29* 28*  ANIONGAP 10 10     Hematology Recent Labs  Lab 03/22/20 0425 03/24/20 0441  WBC 8.5 9.9  RBC 3.33* 3.24*  HGB 10.2* 10.0*  HCT 31.8* 31.0*  MCV 95.5 95.7  MCH 30.6 30.9  MCHC 32.1 32.3  RDW 13.9 13.7  PLT 224 198    BNPNo results for input(s): BNP, PROBNP in the last 168 hours.   DDimer  Recent Labs  Lab 03/22/20 0425 03/24/20 1002  DDIMER 10.18* 1.32*     Radiology    DG C-Arm 1-60 Min-No Report  Result Date: 03/23/2020 Fluoroscopy was utilized by the requesting physician.  No radiographic interpretation.   DG HIP OPERATIVE UNILAT W OR W/O PELVIS LEFT  Result Date: 03/23/2020 CLINICAL DATA:  Hip replacement EXAM: OPERATIVE LEFT HIP (WITH  PELVIS IF PERFORMED) 1 VIEW TECHNIQUE: Fluoroscopic spot image(s) were submitted for interpretation post-operatively. COMPARISON:  None. FINDINGS: Multiple frontal images of the left hip are identified demonstrating total left hip arthroplasty. No evidence of complication on these images. IMPRESSION: Fluoroscopic guidance for left total hip arthroplasty. Electronically Signed   By: Macy Mis M.D.   On: 03/23/2020 14:24    Cardiac Studies   Echo 11/28/19: 1. The aortic valve is abnormal. There is severe calcifcation of the  aortic valve. Aortic valve regurgitation is mild. Severe aortic valve  stenosis. Aortic valve area, by VTI measures 0.40 cm. Aortic valve mean  gradient measures 53.0 mmHg. Aortic valve  Vmax measures 4.16 m/s.  2. Left ventricular ejection fraction, by estimation, is 55 to 60%. The  left ventricle has normal function. The left ventricle has no regional  wall motion abnormalities. Left ventricular diastolic parameters are  consistent with Grade II diastolic  dysfunction (pseudonormalization). Elevated left ventricular end-diastolic  pressure. The average left ventricular global longitudinal strain is -20.1  %. The global longitudinal strain is normal.  3. Right ventricular systolic function is normal. The right ventricular  size is normal. There is mildly elevated pulmonary artery systolic  pressure. The estimated right ventricular systolic pressure is 32.9 mmHg.  4. Left atrial size was mildly dilated.  5. Right atrial size was mildly dilated.  6. The mitral valve is grossly normal. Mild to moderate mitral valve  regurgitation. No evidence of mitral stenosis.  7. The inferior vena cava is dilated in size with >50% respiratory  variability, suggesting right atrial pressure of 8 mmHg.   Patient Profile     87 y.o. female with severe aortic stenosis not a candidate for TAVR, CAD s/p CABG, hypertension, HL, and advanced dementia admitted with hip fracture.   Indicidtally found to have COVID-19.  She is asymptomatic.   Assessment & Plan    # Severe aortic stenosis:  # Hypertension:  Mean gradient 53 mmHg, consistent with critical aortic stenosis.  She did well with surgery.  Blood pressure remained stable.  Her home amlodipine and losartan are on hold.  Carvedilol was reduced to 6.25 mg due to bradycardia.  # CAD s/p CABG: # Hyperlipidemia:  Stable.  Not an active issue.  Continue aspirin, and Zetia.  Reduce carvedilol as above.  # PACs:  Noted on EKG.  Automated BP cuff reading bradycardic at times.  Suspect this is related to PACs.  Will place her on telemetry.  Continue carvedilol.   #Hyponatremia: She appears fairly euvolemic.  If her sodium continues  to be low tomorrow, would start low-dose furosemide.     For questions or updates, please contact [place] Please consult [email or web address] for contact info under        Signed, Skeet Latch, MD  03/24/2020, 1:18 PM

## 2020-03-24 NOTE — Evaluation (Signed)
Physical Therapy Evaluation Patient Details Name: [name] MRN: [id number] DOB: 03-31-1931 Today's Date: 03/24/2020   History of Present Illness  87 year old female with past medical history of coronary artery disease with CABG, hypertension, GERD, hypothyroidism, severe aortic stenosis (not a candidate for TAVR), dyslipidemia and admitted after fall and sustained left femoral neck fracture.  Pt s/p left hip hemiarthroplasty direct anterior approach on 03/23/20.  Clinical Impression  Patient is s/p above surgery resulting in functional limitations due to the deficits listed below (see PT Problem List).  Patient will benefit from skilled PT to increase their independence and safety with mobility to allow discharge to the venue listed below. Pt pleasant and cooperative however presents with poor memory. Pt assisted to/from bathroom and requires cues for technique and safety.  Recommend SNF upon d/c.       Follow Up Recommendations SNF    Equipment Recommendations  Rolling walker with 5" wheels    Recommendations for Other Services       Precautions / Restrictions Precautions Precautions: Fall Restrictions Weight Bearing Restrictions: No Other Position/Activity Restrictions: WBAT      Mobility  Bed Mobility Overal bed mobility: Needs Assistance Bed Mobility: Supine to Sit     Supine to sit: Min guard     General bed mobility comments: min/guard for safety    Transfers Overall transfer level: Needs assistance Equipment used: Rolling walker (2 wheeled) Transfers: Sit to/from Stand Sit to Stand: Min guard         General transfer comment: min/guard for safety, cues for waiting until RW placement for safety, pt eager to get to bathroom  Ambulation/Gait Ambulation/Gait assistance: Min guard Gait Distance (Feet): 16 Feet Assistive device: Rolling walker (2 wheeled) Gait Pattern/deviations: Step-to pattern;Decreased stance time - left;Antalgic     General Gait  Details: verbal cues for sequence, RW positioning, step length; pt ambulated to/from bathroom  Stairs            Wheelchair Mobility    Modified Rankin (Stroke Patients Only)       Balance Overall balance assessment: History of Falls                                           Pertinent Vitals/Pain Pain Assessment: No/denies pain    Home Living Family/patient expects to be discharged to:: Private residence Living Arrangements: Children (son)     Home Access: Stairs to enter   Technical brewer of Steps: 2 Home Layout: Laundry or work area in basement   Additional Comments: above per pt, possible poor historian, unable to recall assistive devices    Prior Function           Comments: ambulatory     Hand Dominance        Extremity/Trunk Assessment        Lower Extremity Assessment Lower Extremity Assessment: LLE deficits/detail LLE Deficits / Details: anticipated hip weakness observed, pt self assisted with bed mobility       Communication   Communication: No difficulties  Cognition Arousal/Alertness: Awake/alert Behavior During Therapy: WFL for tasks assessed/performed Overall Cognitive Status: History of cognitive impairments - at baseline                                 General Comments: hx dementia, poor memory, impulsive, cooperative  General Comments      Exercises     Assessment/Plan    PT Assessment Patient needs continued PT services  PT Problem List Decreased strength;Decreased mobility;Decreased activity tolerance;Decreased balance;Decreased knowledge of use of DME;Decreased knowledge of precautions;Decreased safety awareness;Pain       PT Treatment Interventions DME instruction;Gait training;Balance training;Therapeutic exercise;Functional mobility training;Therapeutic activities;Patient/family education    PT Goals (Current goals can be found in the Care Plan section)  Acute Rehab  PT Goals PT Goal Formulation: With patient Time For Goal Achievement: 03/31/20 Potential to Achieve Goals: Good    Frequency Min 3X/week   Barriers to discharge        Co-evaluation               AM-PAC PT "6 Clicks" Mobility  Outcome Measure Help needed turning from your back to your side while in a flat bed without using bedrails?: A Little Help needed moving from lying on your back to sitting on the side of a flat bed without using bedrails?: A Little Help needed moving to and from a bed to a chair (including a wheelchair)?: A Little Help needed standing up from a chair using your arms (e.g., wheelchair or bedside chair)?: A Little Help needed to walk in hospital room?: A Little Help needed climbing 3-5 steps with a railing? : A Lot 6 Click Score: 17    End of Session Equipment Utilized During Treatment: Gait belt Activity Tolerance: Patient tolerated treatment well Patient left: in chair;with call bell/phone within reach;with chair alarm set Nurse Communication: Mobility status PT Visit Diagnosis: Other abnormalities of gait and mobility (R26.89)    Time: 8338-2505 PT Time Calculation (min) (ACUTE ONLY): 19 min   Charges:   PT Evaluation $PT Eval Low Complexity: 1 Low         Kati PT, DPT Acute Rehabilitation Services Pager: [phone] Office: [phone]  York Ram E 03/24/2020, 3:28 PM

## 2020-03-24 NOTE — TOC Progression Note (Signed)
Transition of Care [facility]) - Progression Note    Patient Details  Name: [name] MRN: [id number] Date of Birth: Feb 26, 1931  [facility] of [facility]) CM/SW Contact  Purcell Mouton, RN Phone Number: 03/24/2020, 4:30 PM  Clinical Narrative:    SNF will not take pt at present time related to pt being positive for COVID. TOC will continue to follow pt for discharge.    Expected Discharge Plan: [place] Barriers to Discharge: No Barriers Identified  Expected Discharge Plan and Services Expected Discharge Plan: [place] arrangements for the past 2 months: Single Family Home                                       Social Determinants of Health (SDOH) Interventions    Readmission Risk Interventions No flowsheet data found.

## 2020-03-25 ENCOUNTER — Inpatient Hospital Stay (HOSPITAL_COMMUNITY): Payer: Medicare Other

## 2020-03-25 ENCOUNTER — Encounter (HOSPITAL_COMMUNITY): Payer: Self-pay | Admitting: Orthopedic Surgery

## 2020-03-25 DIAGNOSIS — S72002A Fracture of unspecified part of neck of left femur, initial encounter for closed fracture: Secondary | ICD-10-CM

## 2020-03-25 DIAGNOSIS — U071 COVID-19: Secondary | ICD-10-CM

## 2020-03-25 DIAGNOSIS — R7989 Other specified abnormal findings of blood chemistry: Secondary | ICD-10-CM

## 2020-03-25 LAB — CBC
HCT: 25.8 % — ABNORMAL LOW (ref 36.0–46.0)
HCT: 29.3 % — ABNORMAL LOW (ref 36.0–46.0)
Hemoglobin: 8.2 g/dL — ABNORMAL LOW (ref 12.0–15.0)
Hemoglobin: 9.5 g/dL — ABNORMAL LOW (ref 12.0–15.0)
MCH: 30.5 pg (ref 26.0–34.0)
MCH: 30.6 pg (ref 26.0–34.0)
MCHC: 31.8 g/dL (ref 30.0–36.0)
MCHC: 32.4 g/dL (ref 30.0–36.0)
MCV: 95.9 fL (ref 80.0–100.0)
MCV: 94.5 fL (ref 80.0–100.0)
Platelets: 190 10*3/uL (ref 150–400)
Platelets: 162 10*3/uL (ref 150–400)
RBC: 2.69 MIL/uL — ABNORMAL LOW (ref 3.87–5.11)
RBC: 3.1 MIL/uL — ABNORMAL LOW (ref 3.87–5.11)
RDW: 13.8 % (ref 11.5–15.5)
RDW: 13.9 % (ref 11.5–15.5)
WBC: 9.5 10*3/uL (ref 4.0–10.5)
WBC: 9.2 10*3/uL (ref 4.0–10.5)
nRBC: 0 % (ref 0.0–0.2)
nRBC: 0 % (ref 0.0–0.2)

## 2020-03-25 LAB — BASIC METABOLIC PANEL
Anion gap: 9 (ref 5–15)
BUN: 45 mg/dL — ABNORMAL HIGH (ref 8–23)
CO2: 21 mmol/L — ABNORMAL LOW (ref 22–32)
Calcium: 8.7 mg/dL — ABNORMAL LOW (ref 8.9–10.3)
Chloride: 103 mmol/L (ref 98–111)
Creatinine, Ser: 1.74 mg/dL — ABNORMAL HIGH (ref 0.44–1.00)
GFR, Estimated: 28 mL/min — ABNORMAL LOW (ref >60)
Glucose, Bld: 91 mg/dL (ref 70–99)
Potassium: 4.3 mmol/L (ref 3.5–5.1)
Sodium: 133 mmol/L — ABNORMAL LOW (ref 135–145)

## 2020-03-25 MED ORDER — CARVEDILOL 6.25 MG PO TABS: 6.2500 mg | ORAL_TABLET | Freq: Two times a day (BID) | ORAL | 0 refills | Status: DC

## 2020-03-25 MED ORDER — DOCUSATE SODIUM 100 MG PO CAPS: 100.0000 mg | ORAL_CAPSULE | Freq: Two times a day (BID) | ORAL | 0 refills | Status: DC

## 2020-03-25 NOTE — TOC Progression Note (Addendum)
Transition of Care [facility]) - Progression Note    Patient Details  Name: [name] MRN: [id number] Date of Birth: 05/02/1931  Transition of Care [facility]) CM/SW Contact  Purcell Mouton, RN Phone Number: 03/25/2020, 12:29 PM  Clinical Narrative:    Spoke with pt's sister and son [name] concerning discharge plans. [name] states that pt will come home, there are people at home to take care of her. Pt has walker at home. [name] states that he will come pick pt up. Checking on [facility] agency.    Expected Discharge Plan: [place] Barriers to Discharge: No Barriers Identified  Expected Discharge Plan and Services Expected Discharge Plan: [place] arrangements for the past 2 months: Single Family Home                                       Social Determinants of Health (SDOH) Interventions    Readmission Risk Interventions No flowsheet data found.

## 2020-03-25 NOTE — Plan of Care (Signed)
  Problem: Coping: Goal: Level of anxiety will decrease Outcome: Progressing   Problem: Pain Managment: Goal: General experience of comfort will improve Outcome: Progressing   Problem: Safety: Goal: Ability to remain free from injury will improve Outcome: Progressing   Problem: Skin Integrity: Goal: Risk for impaired skin integrity will decrease Outcome: Progressing   

## 2020-03-25 NOTE — Progress Notes (Signed)
Progress Note  Patient Name: [name] Date of Encounter: 03/25/2020  [facility] Cardiologist: Jenkins Rouge, MD   Subjective   Feeling well.  Mild leg pain.  Denies chest pain or shortness of breath. `  Inpatient Medications    Scheduled Meds: . aspirin EC  81 mg Oral QHS  . calcium carbonate  1 tablet Oral QHS  . carvedilol  6.25 mg Oral BID WC  . Chlorhexidine Gluconate Cloth  6 each Topical Daily  . docusate sodium  100 mg Oral BID  . donepezil  5 mg Oral QHS  . enoxaparin (LOVENOX) injection  30 mg Subcutaneous Q24H  . ezetimibe  10 mg Oral QHS  . ferrous sulfate  325 mg Oral QHS  . levothyroxine  75 mcg Oral QAC breakfast  . Vitamin D (Ergocalciferol)  50,000 Units Oral Q Mon   Continuous Infusions: . methocarbamol (ROBAXIN) IV     PRN Meds: HYDROcodone-acetaminophen, menthol-cetylpyridinium **OR** phenol, methocarbamol **OR** methocarbamol (ROBAXIN) IV, metoCLOPramide **OR** metoCLOPramide (REGLAN) injection, morphine injection, ondansetron **OR** ondansetron (ZOFRAN) IV   Vital Signs    Vitals:   03/24/20 2000 03/24/20 2154 03/25/20 0641 03/25/20 1155  BP:  108/61 (!) 117/57 (!) 140/51  Pulse:  67 62 78  Resp:  16 18 18   Temp:  97.7 F (36.5 C) 97.8 F (36.6 C) (!) 97.5 F (36.4 C)  TempSrc:  Oral Oral Oral  SpO2: 98% 99% 95% 100%  Weight:      Height:        Intake/Output Summary (Last 24 hours) at 03/25/2020 1208 Last data filed at 03/25/2020 0925 Gross per 24 hour  Intake 600 ml  Output 604 ml  Net -4 ml   Last 3 Weights 03/22/2020 11/04/2019 10/04/2018  Weight (lbs) 113 lb 90 lb 96 lb  Weight (kg) 51.256 kg 40.824 kg 43.545 kg      Telemetry    Sinus arrhythmia..- Personally Reviewed  ECG    Sinus rhythm.  Rate 85 bpm.  Frequent PACs.  LVH - Personally Reviewed  Physical Exam   VS:  BP (!) 140/51   Pulse 78 Comment: adjusted per monitor  Temp (!) 97.5 F (36.4 C) (Oral)   Resp 18   Ht 5' (1.524 m)   Wt 51.3 kg   SpO2 100%    BMI 22.07 kg/m  , BMI Body mass index is 22.07 kg/m. GENERAL:  Well appearing.  No acute distress. HEENT: Pupils equal round and reactive, fundi not visualized, oral mucosa unremarkable NECK:  No jugular venous distention, waveform within normal limits, carotid upstroke brisk and symmetric, no bruits LUNGS:  Clear to auscultation bilaterally HEART:  Mostly regular with ectopy.  PMI not displaced or sustained,S1 and S2 within normal limits, no S3, no S4, no clicks, no rubs, III/VI late-peaking systolic murmur at LUSB ABD:  Flat, positive bowel sounds normal in frequency in pitch, no bruits, no rebound, no guarding, no midline pulsatile mass, no hepatomegaly, no splenomegaly EXT:  2 plus pulses throughout, no edema, no cyanosis no clubbing SKIN:  No rashes no nodules NEURO:  Cranial nerves II through XII grossly intact, motor grossly intact throughout PSYCH:  Pleasantly demented.  Oriented to person place but not time or situation  Labs    High Sensitivity Troponin:  No results for input(s): TROPONINIHS in the last 720 hours.    Chemistry Recent Labs  Lab 03/22/20 0425 03/24/20 0441 03/25/20 0422  NA 136 132* 133*  K 4.1 4.7 4.3  CL 103 101 103  CO2 23 21* 21*  GLUCOSE 105* 159* 91  BUN 20 31* 45*  CREATININE 1.72* 1.75* 1.74*  CALCIUM 9.3 8.6* 8.7*  GFRNONAA 29* 28* 28*  ANIONGAP 10 10 9      Hematology Recent Labs  Lab 03/22/20 0425 03/24/20 0441 03/25/20 0422  WBC 8.5 9.9 9.5  RBC 3.33* 3.24* 2.69*  HGB 10.2* 10.0* 8.2*  HCT 31.8* 31.0* 25.8*  MCV 95.5 95.7 95.9  MCH 30.6 30.9 30.5  MCHC 32.1 32.3 31.8  RDW 13.9 13.7 13.8  PLT 224 198 190    BNPNo results for input(s): BNP, PROBNP in the last 168 hours.   DDimer  Recent Labs  Lab 03/22/20 0425 03/24/20 1002  DDIMER 10.18* 1.32*     Radiology    DG C-Arm 1-60 Min-No Report  Result Date: 03/23/2020 Fluoroscopy was utilized by the requesting physician.  No radiographic interpretation.   DG HIP  OPERATIVE UNILAT W OR W/O PELVIS LEFT  Result Date: 03/23/2020 CLINICAL DATA:  Hip replacement EXAM: OPERATIVE LEFT HIP (WITH PELVIS IF PERFORMED) 1 VIEW TECHNIQUE: Fluoroscopic spot image(s) were submitted for interpretation post-operatively. COMPARISON:  None. FINDINGS: Multiple frontal images of the left hip are identified demonstrating total left hip arthroplasty. No evidence of complication on these images. IMPRESSION: Fluoroscopic guidance for left total hip arthroplasty. Electronically Signed   By: Macy Mis M.D.   On: 03/23/2020 14:24   VAS [place] LOWER EXTREMITY VENOUS (DVT)  Result Date: 03/25/2020  Lower Venous DVT Study Indications: Covid+ elevated d dimer.  Risk Factors: Surgery Hip SX post fall. Comparison Study: No previous Performing Technologist: Rogelia Rohrer  Examination Guidelines: A complete evaluation includes B-mode imaging, spectral Doppler, color Doppler, and power Doppler as needed of all accessible portions of each vessel. Bilateral testing is considered an integral part of a complete examination. Limited examinations for reoccurring indications may be performed as noted. The reflux portion of the exam is performed with the patient in reverse Trendelenburg.  +---------+---------------+---------+-----------+----------+--------------+ RIGHT    CompressibilityPhasicitySpontaneityPropertiesThrombus Aging +---------+---------------+---------+-----------+----------+--------------+ CFV      Full           Yes      Yes                                 +---------+---------------+---------+-----------+----------+--------------+ SFJ      Full                                                        +---------+---------------+---------+-----------+----------+--------------+ FV Prox  Full           Yes      Yes                                 +---------+---------------+---------+-----------+----------+--------------+ FV Mid   Full           Yes      Yes                                  +---------+---------------+---------+-----------+----------+--------------+ FV DistalFull           Yes      Yes                                 +---------+---------------+---------+-----------+----------+--------------+  PFV      Full                                                        +---------+---------------+---------+-----------+----------+--------------+ POP      Full           Yes      Yes                                 +---------+---------------+---------+-----------+----------+--------------+ PTV      Full                                                        +---------+---------------+---------+-----------+----------+--------------+ PERO     Full                                                        +---------+---------------+---------+-----------+----------+--------------+   +---------+---------------+---------+-----------+----------+--------------+ LEFT     CompressibilityPhasicitySpontaneityPropertiesThrombus Aging +---------+---------------+---------+-----------+----------+--------------+ CFV      Full           Yes      Yes                                 +---------+---------------+---------+-----------+----------+--------------+ SFJ      Full                                                        +---------+---------------+---------+-----------+----------+--------------+ FV Prox  Full           Yes      Yes                                 +---------+---------------+---------+-----------+----------+--------------+ FV Mid   Full           Yes      Yes                                 +---------+---------------+---------+-----------+----------+--------------+ FV DistalFull           Yes      Yes                                 +---------+---------------+---------+-----------+----------+--------------+ PFV      Full                                                         +---------+---------------+---------+-----------+----------+--------------+  POP      Full           Yes      Yes                                 +---------+---------------+---------+-----------+----------+--------------+ PTV      Full                                                        +---------+---------------+---------+-----------+----------+--------------+ PERO     Full                                                        +---------+---------------+---------+-----------+----------+--------------+     Summary: BILATERAL: - No evidence of deep vein thrombosis seen in the lower extremities, bilaterally. - No evidence of superficial venous thrombosis in the lower extremities, bilaterally. - RIGHT: - A cystic structure is found in the popliteal fossa.  LEFT: - A cystic structure is found in the popliteal fossa.  *See table(s) above for measurements and observations.    Preliminary     Cardiac Studies   Echo 11/28/19: 1. The aortic valve is abnormal. There is severe calcifcation of the  aortic valve. Aortic valve regurgitation is mild. Severe aortic valve  stenosis. Aortic valve area, by VTI measures 0.40 cm. Aortic valve mean  gradient measures 53.0 mmHg. Aortic valve  Vmax measures 4.16 m/s.  2. Left ventricular ejection fraction, by estimation, is 55 to 60%. The  left ventricle has normal function. The left ventricle has no regional  wall motion abnormalities. Left ventricular diastolic parameters are  consistent with Grade II diastolic  dysfunction (pseudonormalization). Elevated left ventricular end-diastolic  pressure. The average left ventricular global longitudinal strain is -20.1  %. The global longitudinal strain is normal.  3. Right ventricular systolic function is normal. The right ventricular  size is normal. There is mildly elevated pulmonary artery systolic  pressure. The estimated right ventricular systolic pressure is 50.9 mmHg.  4. Left atrial size  was mildly dilated.  5. Right atrial size was mildly dilated.  6. The mitral valve is grossly normal. Mild to moderate mitral valve  regurgitation. No evidence of mitral stenosis.  7. The inferior vena cava is dilated in size with >50% respiratory  variability, suggesting right atrial pressure of 8 mmHg.   Patient Profile     87 y.o. female with severe aortic stenosis not a candidate for TAVR, CAD s/p CABG, hypertension, HL, and advanced dementia admitted with hip fracture.  Indicidtally found to have COVID-19.  She is asymptomatic.   Assessment & Plan    # Severe aortic stenosis:  # Hypertension:  Mean gradient 53 mmHg, consistent with critical aortic stenosis.  She did well with surgery.  Blood pressure remained stable.  Her home amlodipine and losartan are on hold.  Carvedilol was reduced to 6.25 mg due to bradycardia which has been resolved.    # CAD s/p CABG: # Hyperlipidemia:  Stable.  Not an active issue.  Continue aspirin, and Zetia.  Reduce carvedilol as above.  # PACs:  Noted on EKG.  Automated BP cuff reading bradycardic at times.  Suspect this is related to PACs.  Stable on telemetry.  Continue carvedilol.   # Hyponatremia: She appears fairly euvolemic.  If her sodium continues to be low tomorrow, would start low-dose furosemide.  # Elevated D-Dimer:  Negative for DVT.    [facility] will sign off.   Medication Recommendations:  Continue current regimen Other recommendations (labs, testing, etc):  none Follow up as an outpatient:  We will arrange    For questions or updates, please contact [place] Please consult [email or web address] for contact info under        Signed, Skeet Latch, MD  03/25/2020, 12:08 PM

## 2020-03-25 NOTE — Progress Notes (Signed)
AVS reviewed with patient. All follow up appointments and medication discussed with patient. Hip precaution instructions read to patient and pt verbalized understanding.  IV access removed. Dressing applied. Pt clothes placed back on. Pt belongings (purse/dentures) placed in pt belonging bag to go with pt.  Sister [name] , called and is aware to pick up pt. [name] states she is in [place] and will be on her way.  Awaiting transport home at this time.

## 2020-03-25 NOTE — CV Procedure (Signed)
BLE venous duplex completed.  Results can be found under chart review under CV PROC. 03/25/2020 11:12 AM Cleto Claggett RVT, RDMS

## 2020-03-25 NOTE — Discharge Instructions (Addendum)
Dr. Rod Can Joint Replacement Specialist [facility] [address] [place], [place] [address] [phone]   TOTAL HIP REPLACEMENT POSTOPERATIVE DIRECTIONS    Hip Rehabilitation, Guidelines Following Surgery   WEIGHT BEARING Weight bearing as tolerated with assist device (walker, cane, etc) as directed, use it as long as suggested by your surgeon or therapist, typically at least 4-6 weeks.  The results of a hip operation are greatly improved after range of motion and muscle strengthening exercises. Follow all safety measures which are given to protect your hip. If any of these exercises cause increased pain or swelling in your joint, decrease the amount until you are comfortable again. Then slowly increase the exercises. Call your caregiver if you have problems or questions.   HOME CARE INSTRUCTIONS  Most of the following instructions are designed to prevent the dislocation of your new hip.  Remove items at home which could result in a fall. This includes throw rugs or furniture in walking pathways.  Continue medications as instructed at time of discharge.  You may have some home medications which will be placed on hold until you complete the course of blood thinner medication.  You may start showering once you are discharged home. Do not remove your dressing. Do not put on socks or shoes without following the instructions of your caregivers.   Sit on chairs with arms. Use the chair arms to help push yourself up when arising.  Arrange for the use of a toilet seat elevator so you are not sitting low.   Walk with walker as instructed.  You may resume a sexual relationship in one month or when given the OK by your caregiver.  Use walker as long as suggested by your caregivers.  You may put full weight on your legs and walk as much as is comfortable. Avoid periods of inactivity such as sitting longer than an hour when not asleep. This helps prevent  blood clots.  You may return to work once you are cleared by [employer], production.  Do not drive a car for 6 weeks or until released by your surgeon.  Do not drive while taking narcotics.  Wear elastic stockings for two weeks following surgery during the day but you may remove then at night.  Make sure you keep all of your appointments after your operation with all of your doctors and caregivers. You should call the office at the above phone number and make an appointment for approximately two weeks after the date of your surgery. Please pick up a stool softener and laxative for home use as long as you are requiring pain medications.  ICE to the affected hip every three hours for 30 minutes at a time and then as needed for pain and swelling. Continue to use ice on the hip for pain and swelling from surgery. You may notice swelling that will progress down to the foot and ankle.  This is normal after surgery.  Elevate the leg when you are not up walking on it.   It is important for you to complete the blood thinner medication as prescribed by your doctor.  Continue to use the breathing machine which will help keep your temperature down.  It is common for your temperature to cycle up and down following surgery, especially at night when you are not up moving around and exerting yourself.  The breathing machine keeps your lungs expanded and your temperature down.  RANGE OF MOTION AND STRENGTHENING EXERCISES  These exercises  are designed to help you keep full movement of your hip joint. Follow your caregiver's or physical therapist's instructions. Perform all exercises about fifteen times, three times per day or as directed. Exercise both hips, even if you have had only one joint replacement. These exercises can be done on a training (exercise) mat, on the floor, on a table or on a bed. Use whatever works the best and is most comfortable for you. Use music or television while you are exercising so that the exercises  are a pleasant break in your day. This will make your life better with the exercises acting as a break in routine you can look forward to.  Lying on your back, slowly slide your foot toward your buttocks, raising your knee up off the floor. Then slowly slide your foot back down until your leg is straight again.  Lying on your back spread your legs as far apart as you can without causing discomfort.  Lying on your side, raise your upper leg and foot straight up from the floor as far as is comfortable. Slowly lower the leg and repeat.  Lying on your back, tighten up the muscle in the front of your thigh (quadriceps muscles). You can do this by keeping your leg straight and trying to raise your heel off the floor. This helps strengthen the largest muscle supporting your knee.  Lying on your back, tighten up the muscles of your buttocks both with the legs straight and with the knee bent at a comfortable angle while keeping your heel on the floor.   SKILLED REHAB INSTRUCTIONS: If the patient is transferred to a skilled rehab facility following release from the hospital, a list of the current medications will be sent to the facility for the patient to continue.  When discharged from the skilled rehab facility, please have the facility set up the patient's [place] prior to being released. Also, the skilled facility will be responsible for providing the patient with their medications at time of release from the facility to include their pain medication and their blood thinner medication. If the patient is still at the rehab facility at time of the two week follow up appointment, the skilled rehab facility will also need to assist the patient in arranging follow up appointment in our office and any transportation needs.  MAKE SURE YOU:  Understand these instructions.  Will watch your condition.  Will get help right away if you are not doing well or get worse.  Pick up stool softner and  laxative for home use following surgery while on pain medications. Do not remove your dressing. The dressing is waterproof--it is OK to take showers. Continue to use ice for pain and swelling after surgery. Do not use any lotions or creams on the incision until instructed by your surgeon. Total Hip Protocol.    Additional discharge instructions  Please get your medications reviewed and adjusted by your Primary MD.  Please request your Primary MD to go over all Hospital Tests and Procedure/Radiological results at the follow up, please get all Hospital records sent to your Prim MD by signing hospital release before you go home.  If you had Pneumonia of Lung problems at the Hospital: Please get a 2 view Chest X ray done in 6-8 weeks after hospital discharge or sooner if instructed by your Primary MD.  If you have Congestive Heart Failure: Please call your Cardiologist or Primary MD anytime you have any of the following symptoms:  1) 3 pound weight gain in 24 hours or 5 pounds in 1 week  2) shortness of breath, with or without a dry hacking cough  3) swelling in the hands, feet or stomach  4) if you have to sleep on extra pillows at night in order to breathe  Follow cardiac low salt diet and 1.5 lit/day fluid restriction.  If you have diabetes Accuchecks 4 times/day, Once in AM empty stomach and then before each meal. Log in all results and show them to your primary doctor at your next visit. If any glucose reading is under 80 or above 300 call your primary MD immediately.  If you have Seizure/Convulsions/Epilepsy: Please do not drive, operate heavy machinery, participate in activities at heights or participate in high speed sports until you have seen by Primary MD or a Neurologist and advised to do so again.  If you had Gastrointestinal Bleeding: Please ask your Primary MD to check a complete blood count within one week of discharge or at your next visit. Your endoscopic/colonoscopic  biopsies that are pending at the time of discharge, will also need to followed by your Primary MD.  Get Medicines reviewed and adjusted. Please take all your medications with you for your next visit with your Primary MD  Please request your Primary MD to go over all hospital tests and procedure/radiological results at the follow up, please ask your Primary MD to get all Hospital records sent to his/her office.  If you experience worsening of your admission symptoms, develop shortness of breath, life threatening emergency, suicidal or homicidal thoughts you must seek medical attention immediately by calling 911 or calling your MD immediately  if symptoms less severe.  You must read complete instructions/literature along with all the possible adverse reactions/side effects for all the Medicines you take and that have been prescribed to you. Take any new Medicines after you have completely understood and accpet all the possible adverse reactions/side effects.   Do not drive or operate heavy machinery when taking Pain medications.   Do not take more than prescribed Pain, Sleep and Anxiety Medications  Special Instructions: If you have smoked or chewed Tobacco  in the last 2 yrs please stop smoking, stop any regular Alcohol  and or any Recreational drug use.  Wear Seat belts while driving.  Please note You were cared for by a hospitalist during your hospital stay. If you have any questions about your discharge medications or the care you received while you were in the hospital after you are discharged, you can call the unit and asked to speak with the hospitalist on call if the hospitalist that took care of you is not available. Once you are discharged, your primary care physician will handle any further medical issues. Please note that NO REFILLS for any discharge medications will be authorized once you are discharged, as it is imperative that you return to your primary care physician (or establish a  relationship with a primary care physician if you do not have one) for your aftercare needs so that they can reassess your need for medications and monitor your lab values.  You can reach the hospitalist office at phone [phone] or fax [phone]   If you do not have a primary care physician, you can call [phone] for a physician referral.     Person Under Monitoring Name: [name]  Location: [place] [id number]   Infection Prevention Recommendations for Individuals Confirmed to have, or Being Evaluated  for, 2019 Novel Coronavirus (COVID-19) Infection Who Receive Care at Home  Individuals who are confirmed to have, or are being evaluated for, COVID-19 should follow the prevention steps below until a healthcare provider or local or state health department says they can return to normal activities.  Stay home except to get medical care You should restrict activities outside your home, except for getting medical care. Do not go to work, school, or public areas, and do not use public transportation or taxis.  Call ahead before visiting your doctor Before your medical appointment, call the healthcare provider and tell them that you have, or are being evaluated for, COVID-19 infection. This will help the healthcare provider's office take steps to keep other people from getting infected. Ask your healthcare provider to call the local or state health department.  Monitor your symptoms Seek prompt medical attention if your illness is worsening (e.g., difficulty breathing). Before going to your medical appointment, call the healthcare provider and tell them that you have, or are being evaluated for, COVID-19 infection. Ask your healthcare provider to call the local or state health department.  Wear a facemask You should wear a facemask that covers your nose and mouth when you are in the same room with other people and when you visit a healthcare  provider. People who live with or visit you should also wear a facemask while they are in the same room with you.  Separate yourself from other people in your home As much as possible, you should stay in a different room from other people in your home. Also, you should use a separate bathroom, if available.  Avoid sharing household items You should not share dishes, drinking glasses, cups, eating utensils, towels, bedding, or other items with other people in your home. After using these items, you should wash them thoroughly with soap and water.  Cover your coughs and sneezes Cover your mouth and nose with a tissue when you cough or sneeze, or you can cough or sneeze into your sleeve. Throw used tissues in a lined trash can, and immediately wash your hands with soap and water for at least 20 seconds or use an alcohol-based hand rub.  Wash your [facility] your hands often and thoroughly with soap and water for at least 20 seconds. You can use an alcohol-based hand sanitizer if soap and water are not available and if your hands are not visibly dirty. Avoid touching your eyes, nose, and mouth with unwashed hands.   Prevention Steps for Caregivers and Household Members of Individuals Confirmed to have, or Being Evaluated for, COVID-19 Infection Being Cared for in the Home  If you live with, or provide care at home for, a person confirmed to have, or being evaluated for, COVID-19 infection please follow these guidelines to prevent infection:  Follow healthcare provider's instructions Make sure that you understand and can help the patient follow any healthcare provider instructions for all care.  Provide for the patient's basic needs You should help the patient with basic needs in the home and provide support for getting groceries, prescriptions, and other personal needs.  Monitor the patient's symptoms If they are getting sicker, call his or her medical provider and tell them that the  patient has, or is being evaluated for, COVID-19 infection. This will help the healthcare provider's office take steps to keep other people from getting infected. Ask the healthcare provider to call the local or state health department.  Limit the number of people who have  contact with the patient  If possible, have only one caregiver for the patient.  Other household members should stay in another home or place of residence. If this is not possible, they should stay  in another room, or be separated from the patient as much as possible. Use a separate bathroom, if available.  Restrict visitors who do not have an essential need to be in the home.  Keep older adults, very young children, and other sick people away from the patient Keep older adults, very young children, and those who have compromised immune systems or chronic health conditions away from the patient. This includes people with chronic heart, lung, or kidney conditions, diabetes, and cancer.  Ensure good ventilation Make sure that shared spaces in the home have good air flow, such as from an air conditioner or an opened window, weather permitting.  Wash your hands often  Wash your hands often and thoroughly with soap and water for at least 20 seconds. You can use an alcohol based hand sanitizer if soap and water are not available and if your hands are not visibly dirty.  Avoid touching your eyes, nose, and mouth with unwashed hands.  Use disposable paper towels to dry your hands. If not available, use dedicated cloth towels and replace them when they become wet.  Wear a facemask and gloves  Wear a disposable facemask at all times in the room and gloves when you touch or have contact with the patient's blood, body fluids, and/or secretions or excretions, such as sweat, saliva, sputum, nasal mucus, vomit, urine, or feces.  Ensure the mask fits over your nose and mouth tightly, and do not touch it during use.  Throw out  disposable facemasks and gloves after using them. Do not reuse.  Wash your hands immediately after removing your facemask and gloves.  If your personal clothing becomes contaminated, carefully remove clothing and launder. Wash your hands after handling contaminated clothing.  Place all used disposable facemasks, gloves, and other waste in a lined container before disposing them with other household waste.  Remove gloves and wash your hands immediately after handling these items.  Do not share dishes, glasses, or other household items with the patient  Avoid sharing household items. You should not share dishes, drinking glasses, cups, eating utensils, towels, bedding, or other items with a patient who is confirmed to have, or being evaluated for, COVID-19 infection.  After the person uses these items, you should wash them thoroughly with soap and water.  Wash laundry thoroughly  Immediately remove and wash clothes or bedding that have blood, body fluids, and/or secretions or excretions, such as sweat, saliva, sputum, nasal mucus, vomit, urine, or feces, on them.  Wear gloves when handling laundry from the patient.  Read and follow directions on labels of laundry or clothing items and detergent. In general, wash and dry with the warmest temperatures recommended on the label.  Clean all areas the individual has used often  Clean all touchable surfaces, such as counters, tabletops, doorknobs, bathroom fixtures, toilets, phones, keyboards, tablets, and bedside tables, every day. Also, clean any surfaces that may have blood, body fluids, and/or secretions or excretions on them.  Wear gloves when cleaning surfaces the patient has come in contact with.  Use a diluted bleach solution (e.g., dilute bleach with 1 part bleach and 10 parts water) or a household disinfectant with a label that says EPA-registered for coronaviruses. To make a bleach solution at home, add 1 tablespoon of bleach to  1  quart (4 cups) of water. For a larger supply, add  cup of bleach to 1 gallon (16 cups) of water.  Read labels of cleaning products and follow recommendations provided on product labels. Labels contain instructions for safe and effective use of the cleaning product including precautions you should take when applying the product, such as wearing gloves or eye protection and making sure you have good ventilation during use of the product.  Remove gloves and wash hands immediately after cleaning.  Monitor yourself for signs and symptoms of illness Caregivers and household members are considered close contacts, should monitor their health, and will be asked to limit movement outside of the home to the extent possible. Follow the monitoring steps for close contacts listed on the symptom monitoring form.   ? If you have additional questions, contact your local health department or call the epidemiologist on call at [phone] (available 24/7). ? This guidance is subject to change. For the most up-to-date guidance from [facility], please refer to their website: [email or web address]

## 2020-03-25 NOTE — Discharge Summary (Addendum)
Physician Discharge Summary  [name] HGD:[id number] DOB: [birth year]  PCP: Lajean Manes, MD  Admitted from: Home.  Family declined SNF. Discharged to: Home.  Admit date: 03/22/2020 Discharge date: 03/25/2020  Recommendations for Outpatient Follow-up:    Follow-up Information    Swinteck, Aaron Edelman, MD. Schedule an appointment as soon as possible for a visit in 2 weeks.   Specialty: Orthopedic Surgery Why: For wound re-check, For suture removal Contact information: [address] [place] [address] [phone]        Lajean Manes, MD. Schedule an appointment as soon as possible for a visit in 1 week(s).   Specialty: Internal Medicine Why: To be seen with repeat labs (CBC & BMP). Contact information: [address] [place] [address] [phone]        [name], MD Follow up.   Specialty: Cardiology Why: MDs office will arrange follow-up visit. Contact information: [address] [place] [address] [phone]        [facility] Follow up.   Specialty: Home Health Services Why: Alvis Lemmings will follow you at home for Home Health PT. Please call Alvis Lemmings if you have any questions or concerns.  Contact information: [place] [address] [phone]                Home Health:  Home Health Orders (From admission, onward)    Start     Ordered   03/25/20 [place]  At discharge       Question:  To provide the following care/treatments  Answer:  PT   03/25/20 1307           Equipment/Devices: None    Discharge Condition: Improved and stable   Code Status: Full Code Diet recommendation:  Discharge Diet Orders (From admission, onward)    Start     Ordered   03/25/20 0000  Diet - low sodium heart healthy        03/25/20 1307           Discharge Diagnoses:  Principal Problem:   Left displaced femoral neck fracture ([place]) Active  Problems:   Essential hypertension, benign   Pure hypercholesterolemia   Aortic stenosis   Renal insufficiency   Fall   Brief Summary: 87 year old female with medical history not limited to CAD s/p CABG, severe aortic stenosis, mild to moderate Alzheimer's dementia, presented to the ED with left hip pain after fall that she sustained the evening prior to hospitalization.  She reportedly tripped over a cat and landed on the floor injuring her left hip.  She was unable to ambulate and had difficulty with movement.  In the ED she was found to have left femoral neck fracture.  Orthopedics was consulted and she underwent left hip hemiarthroplasty.  She was medically stable for DC to SNF for short-term rehab on 03/24/2020 but COVID-19 RT-PCR results came back incidentally positive and per [facility] team, SNF will not take her until she completes 10 days of isolation.  This was discussed in detail with patient's son by [facility] and this MD.  Son indicates that he prefers to take the patient home where he has good support system along with home health therapies.  Assessment and plan:   1. Left femoral neck fracture: Orthopedics was consulted.  After preop cardiac clearance, she underwent left hip hemiarthroplasty.  As stated above, since patient unable to go to SNF for several days  due to incidental COVID positive status, family has decided to take her home with home health therapies.  I discussed with Dr. Lyla Glassing, orthopedics who is okay with this plan.  As per orthopedics, WBAT with walker, aspirin 81 mg twice daily for postop DVT prophylaxis, outpatient follow-up with him in 2 weeks.  Continue high-dose vitamin D supplements.  Consider outpatient bone density evaluation. 2. COVID-19 infection-patient found to have COVID-19 positive, she did receive 2 doses of COVID-19 vaccine.  At current time she does not have any symptoms of Covid.  Chest x-ray unremarkable.  Labs obtained shows CRP 0.7.  She does have elevated  D-dimer.    D-dimer has however decreased significantly from 10.18 on 3/6-1.32 on 3/8.    Patient is asymptomatic of COVID-19 infection and is not hypoxic even on room air.  She did receive 2 days of IV remdesivir. 3. Elevated D-dimer-D-dimer:  Patient not requiring oxygen.  Low index of suspicion for acute PE.  D-dimer has significantly improved as noted above.  Elevated D-dimer could have been due to acute illness/fracture and surgery also.    Lower extremity venous Dopplers negative for DVT. 4. Severe aortic stenosis-patient has normal EF, mean gradient 53 mmHg, valve area 0.4 cm, patient is not a candidate for TAVR as per cardiology due to dementia.  She appears to have tolerated the surgery well.  Cardiology follow-up appreciated.  Continue reduced dose carvedilol (dose was reduced due to bradycardia which has now resolved).  Home amlodipine and losartan were discontinued during this hospitalization. 5. Acute kidney injury versus CKD stage IIIb-patient's creatinine is 1.72, likely new baseline.    ARB discontinued during this hospitalization.  Creatinine has plateaued in the 1.7 range for the last 3 days.  Baseline creatinine not known.  Last known creatinine CHL was 1.3 but that was in 2013.  It is possible that the current 1.7 is actually her baseline.  Follow BMP closely as outpatient. 6. Hypertension-patient blood pressure has been stable, she had been having episodes of bradycardia in the hospital will cut down Coreg to 6.25 mg p.o. twice daily.  Her other home medications including amlodipine and losartan were discontinued due to soft blood pressures and suspected AKI.  Close follow-up as outpatient to consider resuming these meds if indicated. 7. Postop acute blood loss anemia: Likely due to recent surgery.  No clinically active bleeding noted.  Hemoglobin dropped from 10 g yesterday to 8.2 g today. Continue iron and B12 supplements at discharge.  Repeat CBC shows hemoglobin of 9.5, this morning's  lab work may be an error.  Stable.  Close follow-up of CBC as outpatient 8. Dementia without behavioral abnormalities: Appears stable.  Continue Aricept. 9. Hyperlipidemia: Continue Zetia. 10. Hypothyroid: Continue Synthroid.  Follow TSH as outpatient with PCP 11. CAD s/p CABG: No anginal symptoms.  Continue Zetia, reduced dose carvedilol.  After patient has completed twice daily course of aspirin for postop DVT prophylaxis, she should return to aspirin 81 mg daily as prior to surgery.  PCP to follow-up on this. 12. Hyponatremia: Mild and stable.  Could be due to AKI, recent ARB use.  Follow BMP as outpatient.    Consultations:  Orthopedics  Cardiology  Procedures:  Left hip hemiarthroplasty   Discharge Instructions  Discharge Instructions    Call MD for:  difficulty breathing, headache or visual disturbances   Complete by: As directed    Call MD for:  extreme fatigue   Complete by: As directed    Call MD  for:  persistant dizziness or light-headedness   Complete by: As directed    Call MD for:  persistant nausea and vomiting   Complete by: As directed    Call MD for:  redness, tenderness, or signs of infection (pain, swelling, redness, odor or green/yellow discharge around incision site)   Complete by: As directed    Call MD for:  severe uncontrolled pain   Complete by: As directed    Call MD for:  temperature >100.4   Complete by: As directed    Diet - low sodium heart healthy   Complete by: As directed    Increase activity slowly   Complete by: As directed    No wound care   Complete by: As directed        Medication List    STOP taking these medications   amLODipine 5 MG tablet Commonly known as: NORVASC   losartan 25 MG tablet Commonly known as: COZAAR     TAKE these medications   aspirin 81 MG EC tablet Take 1 tablet (81 mg total) by mouth 2 (two) times daily with a meal. Swallow whole. What changed:   when to take this  additional instructions    B-12 PO Take 1 tablet by mouth daily.   calcium gluconate 500 MG tablet Take 1 tablet by mouth at bedtime.   carvedilol 6.25 MG tablet Commonly known as: COREG Take 1 tablet (6.25 mg total) by mouth 2 (two) times daily with a meal. What changed:   medication strength  how much to take   docusate sodium 100 MG capsule Commonly known as: COLACE Take 1 capsule (100 mg total) by mouth 2 (two) times daily.   donepezil 10 MG tablet Commonly known as: ARICEPT Take 5 mg by mouth at bedtime.   ezetimibe 10 MG tablet Commonly known as: ZETIA Take 10 mg by mouth at bedtime.   ferrous sulfate 325 (65 FE) MG tablet Take 325 mg by mouth at bedtime.   HYDROcodone-acetaminophen 5-325 MG tablet Commonly known as: NORCO/VICODIN Take 1-2 tablets by mouth every 6 (six) hours as needed for moderate pain.   levothyroxine 75 MCG tablet Commonly known as: SYNTHROID Take 75 mcg by mouth at bedtime.   multivitamin with minerals Tabs tablet Take 1 tablet by mouth daily.   Vitamin D (Ergocalciferol) 1.25 MG (50000 UNIT) Caps capsule Commonly known as: DRISDOL Take 50,000 Units by mouth every Monday.      Allergies  Allergen Reactions  . Aleve [Naproxen Sodium] Hives  . Excedrin Extra Strength [Aspirin-Acetaminophen-Caffeine] Nausea And Vomiting  . Statins Other (See Comments)    No energy, very weak  . Vytorin [Ezetimibe-Simvastatin] Other (See Comments)    Fatigue       Procedures/Studies: DG Chest 1 View  Result Date: 03/22/2020 CLINICAL DATA:  Left hip pain after fall EXAM: CHEST  1 VIEW COMPARISON:  09/13/2011 FINDINGS: Cardiomegaly.  Prior CABG. Prominent interstitial markings without Kerley lines, effusion, or air bronchogram. No visible fracture. IMPRESSION: Cardiomegaly and borderline vascular congestion. Electronically Signed   By: Monte Fantasia M.D.   On: 03/22/2020 04:32   DG Knee Left Port  Result Date: 03/22/2020 CLINICAL DATA:  LEFT leg pain following fall. LEFT  femoral neck fracture. EXAM: PORTABLE LEFT KNEE - 1-2 VIEW COMPARISON:  None. FINDINGS: There is no evidence of fracture, dislocation or joint effusion. Moderate tricompartmental degenerative changes are present. No focal bony lesions are identified. IMPRESSION: 1. No evidence of acute abnormality. 2. Moderate tricompartmental degenerative  changes. Electronically Signed   By: Margarette Canada M.D.   On: 03/22/2020 08:56   DG C-Arm 1-60 Min-No Report  Result Date: 03/23/2020 Fluoroscopy was utilized by the requesting physician.  No radiographic interpretation.   DG HIP OPERATIVE UNILAT W OR W/O PELVIS LEFT  Result Date: 03/23/2020 CLINICAL DATA:  Hip replacement EXAM: OPERATIVE LEFT HIP (WITH PELVIS IF PERFORMED) 1 VIEW TECHNIQUE: Fluoroscopic spot image(s) were submitted for interpretation post-operatively. COMPARISON:  None. FINDINGS: Multiple frontal images of the left hip are identified demonstrating total left hip arthroplasty. No evidence of complication on these images. IMPRESSION: Fluoroscopic guidance for left total hip arthroplasty. Electronically Signed   By: Macy Mis M.D.   On: 03/23/2020 14:24   DG Hip Unilat W or Wo Pelvis 2-3 Views Left  Result Date: 03/22/2020 CLINICAL DATA:  Left hip pain after fall EXAM: DG HIP (WITH OR WITHOUT PELVIS) 2-3V LEFT COMPARISON:  None. FINDINGS: Displaced left femoral neck fracture with varus angulation. Generalized osteopenia. No visible pelvic ring fracture. Advanced lumbar spine degeneration with dextroscoliosis where covered. IMPRESSION: Displaced left femoral neck fracture. Electronically Signed   By: Monte Fantasia M.D.   On: 03/22/2020 04:30   VAS [place] LOWER EXTREMITY VENOUS (DVT)  Result Date: 03/25/2020  Lower Venous DVT Study Indications: Covid+ elevated d dimer.  Risk Factors: Surgery Hip SX post fall. Comparison Study: No previous Performing Technologist: Rogelia Rohrer  Examination Guidelines: A complete evaluation includes B-mode imaging, spectral  Doppler, color Doppler, and power Doppler as needed of all accessible portions of each vessel. Bilateral testing is considered an integral part of a complete examination. Limited examinations for reoccurring indications may be performed as noted. The reflux portion of the exam is performed with the patient in reverse Trendelenburg.  +---------+---------------+---------+-----------+----------+--------------+ RIGHT    CompressibilityPhasicitySpontaneityPropertiesThrombus Aging +---------+---------------+---------+-----------+----------+--------------+ CFV      Full           Yes      Yes                                 +---------+---------------+---------+-----------+----------+--------------+ SFJ      Full                                                        +---------+---------------+---------+-----------+----------+--------------+ FV Prox  Full           Yes      Yes                                 +---------+---------------+---------+-----------+----------+--------------+ FV Mid   Full           Yes      Yes                                 +---------+---------------+---------+-----------+----------+--------------+ FV DistalFull           Yes      Yes                                 +---------+---------------+---------+-----------+----------+--------------+ PFV      Full                                                        +---------+---------------+---------+-----------+----------+--------------+  POP      Full           Yes      Yes                                 +---------+---------------+---------+-----------+----------+--------------+ PTV      Full                                                        +---------+---------------+---------+-----------+----------+--------------+ PERO     Full                                                        +---------+---------------+---------+-----------+----------+--------------+    +---------+---------------+---------+-----------+----------+--------------+ LEFT     CompressibilityPhasicitySpontaneityPropertiesThrombus Aging +---------+---------------+---------+-----------+----------+--------------+ CFV      Full           Yes      Yes                                 +---------+---------------+---------+-----------+----------+--------------+ SFJ      Full                                                        +---------+---------------+---------+-----------+----------+--------------+ FV Prox  Full           Yes      Yes                                 +---------+---------------+---------+-----------+----------+--------------+ FV Mid   Full           Yes      Yes                                 +---------+---------------+---------+-----------+----------+--------------+ FV DistalFull           Yes      Yes                                 +---------+---------------+---------+-----------+----------+--------------+ PFV      Full                                                        +---------+---------------+---------+-----------+----------+--------------+ POP      Full           Yes      Yes                                 +---------+---------------+---------+-----------+----------+--------------+ PTV  Full                                                        +---------+---------------+---------+-----------+----------+--------------+ PERO     Full                                                        +---------+---------------+---------+-----------+----------+--------------+     Summary: BILATERAL: - No evidence of deep vein thrombosis seen in the lower extremities, bilaterally. - No evidence of superficial venous thrombosis in the lower extremities, bilaterally. - RIGHT: - A cystic structure is found in the popliteal fossa.  LEFT: - A cystic structure is found in the popliteal fossa.  *See table(s) above for measurements  and observations.    Preliminary       Subjective: Patient denies complaints.  Denies left hip pain or pain elsewhere.  No dyspnea, chest pain or palpitations.  As per RN, no acute issues reported.  Discharge Exam:  Vitals:   03/24/20 2000 03/24/20 2154 03/25/20 0641 03/25/20 1155  BP:  108/61 (!) 117/57 (!) 140/51  Pulse:  67 62 78  Resp:  16 18 18   Temp:  97.7 F (36.5 C) 97.8 F (36.6 C) (!) 97.5 F (36.4 C)  TempSrc:  Oral Oral Oral  SpO2: 98% 99% 95% 100%  Weight:      Height:        General: Pleasant elderly female, small built and thinly nourished lying comfortably propped up in bed without distress Cardiovascular: S1 & S2 heard, RRR, S1/S2 +. No rubs, gallops or clicks. No JVD or pedal edema.  Grade 3 x 6 systolic ejection murmur best heard at apex. Respiratory: Clear to auscultation without wheezing, rhonchi or crackles. No increased work of breathing. Abdominal:  Non distended, non tender & soft. No organomegaly or masses appreciated. Normal bowel sounds heard. CNS: Alert and oriented x2. No focal deficits. Extremities: no edema, no cyanosis.  Left hip surgical site dressing clean and dry.    The results of significant diagnostics from this hospitalization (including imaging, microbiology, ancillary and laboratory) are listed below for reference.     Microbiology: Recent Results (from the past 240 hour(s))  Resp Panel by RT-PCR (Flu A&B, Covid) Nasopharyngeal Swab     Status: Abnormal   Collection Time: 03/22/20  5:29 AM   Specimen: Nasopharyngeal Swab; Nasopharyngeal(NP) swabs in vial transport medium  Result Value Ref Range Status   SARS Coronavirus 2 by RT PCR POSITIVE (A) NEGATIVE Final    Comment: RESULT CALLED TO, READ BACK BY AND VERIFIED WITH: CLAPP,S RN @0729  ON 03/22/20 JACKSON,K (NOTE) SARS-CoV-2 target nucleic acids are DETECTED.  The SARS-CoV-2 RNA is generally detectable in upper respiratory specimens during the acute phase of infection.  Positive results are indicative of the presence of the identified virus, but do not rule out bacterial infection or co-infection with other pathogens not detected by the test. Clinical correlation with patient history and other diagnostic information is necessary to determine patient infection status. The expected result is Negative.  Fact Sheet for Patients: EntrepreneurPulse.com.au  Fact Sheet for Healthcare Providers: IncredibleEmployment.be  This test is not yet approved or cleared  by the [place] and  has been authorized for detection and/or diagnosis of SARS-CoV-2 by FDA under an Emergency Use Authorization (EUA).  This EUA will remain in effect (meaning this test can  be used) for the duration of  the COVID-19 declaration under Section 564(b)(1) of the Act, 21 U.S.C. section 360bbb-3(b)(1), unless the authorization is terminated or revoked sooner.     Influenza A by PCR NEGATIVE NEGATIVE Final   Influenza B by PCR NEGATIVE NEGATIVE Final    Comment: (NOTE) The Xpert Xpress SARS-CoV-2/FLU/RSV plus assay is intended as an aid in the diagnosis of influenza from Nasopharyngeal swab specimens and should not be used as a sole basis for treatment. Nasal washings and aspirates are unacceptable for Xpert Xpress SARS-CoV-2/FLU/RSV testing.  Fact Sheet for Patients: EntrepreneurPulse.com.au  Fact Sheet for Healthcare Providers: IncredibleEmployment.be  This test is not yet approved or cleared by the [place] FDA and has been authorized for detection and/or diagnosis of SARS-CoV-2 by FDA under an Emergency Use Authorization (EUA). This EUA will remain in effect (meaning this test can be used) for the duration of the COVID-19 declaration under Section 564(b)(1) of the Act, 21 U.S.C. section 360bbb-3(b)(1), unless the authorization is terminated or revoked.  Performed at [facility], [place] [address] [place], [place] [address]   Surgical pcr screen     Status: None   Collection Time: 03/23/20 12:03 PM   Specimen: Nasal Mucosa; Nasal Swab  Result Value Ref Range Status   MRSA, PCR NEGATIVE NEGATIVE Final   Staphylococcus aureus NEGATIVE NEGATIVE Final    Comment: (NOTE) The Xpert SA Assay (FDA approved for NASAL specimens in patients 31 years of age and older), is one component of a comprehensive surveillance program. It is not intended to diagnose infection nor to guide or monitor treatment. Performed at [facility], [place] [address], [place],  [address]      Labs: CBC: Recent Labs  Lab 03/22/20 0425 03/24/20 0441 03/25/20 0422 03/25/20 1406  WBC 8.5 9.9 9.5 9.2  NEUTROABS 6.3  --   --   --   HGB 10.2* 10.0* 8.2* 9.5*  HCT 31.8* 31.0* 25.8* 29.3*  MCV 95.5 95.7 95.9 94.5  PLT 224 198 190 510    Basic Metabolic Panel: Recent Labs  Lab 03/22/20 0425 03/24/20 0441 03/25/20 0422  NA 136 132* 133*  K 4.1 4.7 4.3  CL 103 101 103  CO2 23 21* 21*  GLUCOSE 105* 159* 91  BUN 20 31* 45*  CREATININE 1.72* 1.75* 1.74*  CALCIUM 9.3 8.6* 8.7*    I discussed in detail with patient's son initially via phone, updated care and answered all questions.  He is aware of patient's COVID-19 positive status albeit asymptomatic and inability to send to SNF for short-term rehab until 10 days isolation is over.  He has discussed with his family and plans to take patient home.  I subsequently discussed with patient's sister and updated care as well.  Advised them about the 10 days isolation at home and also to seek immediate medical attention if patient or family members develop any acute illness.  They verbalized understanding  Time coordinating discharge: 40 minutes  SIGNED:  Vernell Leep, MD, [place], [facility]. [facility]  To contact the attending provider between 7A-7P or the covering provider during after hours 7P-7A,  please log into the web site [email or web address] and access using universal [place] password for that web site. If you do not  have the password, please call the hospital operator.

## 2020-03-26 ENCOUNTER — Other Ambulatory Visit: Payer: Self-pay | Admitting: Orthopedic Surgery

## 2020-03-26 MED ORDER — HYDROCODONE-ACETAMINOPHEN 5-325 MG PO TABS
1.0000 | ORAL_TABLET | ORAL | 0 refills | Status: DC | PRN
Start: 2020-03-26 — End: 2020-03-26

## 2020-03-27 NOTE — Telephone Encounter (Signed)
Patient's sister (DPR) is aware of results.

## 2020-03-30 DIAGNOSIS — Z96642 Presence of left artificial hip joint: Secondary | ICD-10-CM | POA: Diagnosis not present

## 2020-03-30 DIAGNOSIS — M80052D Age-related osteoporosis with current pathological fracture, left femur, subsequent encounter for fracture with routine healing: Secondary | ICD-10-CM | POA: Diagnosis not present

## 2020-03-30 DIAGNOSIS — G309 Alzheimer's disease, unspecified: Secondary | ICD-10-CM | POA: Diagnosis not present

## 2020-03-30 DIAGNOSIS — U071 COVID-19: Secondary | ICD-10-CM | POA: Diagnosis not present

## 2020-04-03 DIAGNOSIS — I129 Hypertensive chronic kidney disease with stage 1 through stage 4 chronic kidney disease, or unspecified chronic kidney disease: Secondary | ICD-10-CM | POA: Diagnosis not present

## 2020-04-03 DIAGNOSIS — G301 Alzheimer's disease with late onset: Secondary | ICD-10-CM | POA: Diagnosis not present

## 2020-04-03 DIAGNOSIS — K219 Gastro-esophageal reflux disease without esophagitis: Secondary | ICD-10-CM | POA: Diagnosis not present

## 2020-04-03 DIAGNOSIS — E039 Hypothyroidism, unspecified: Secondary | ICD-10-CM | POA: Diagnosis not present

## 2020-04-03 DIAGNOSIS — Z96642 Presence of left artificial hip joint: Secondary | ICD-10-CM | POA: Diagnosis not present

## 2020-04-03 DIAGNOSIS — M810 Age-related osteoporosis without current pathological fracture: Secondary | ICD-10-CM | POA: Diagnosis not present

## 2020-04-03 DIAGNOSIS — N1832 Chronic kidney disease, stage 3b: Secondary | ICD-10-CM | POA: Diagnosis not present

## 2020-04-03 DIAGNOSIS — M80052D Age-related osteoporosis with current pathological fracture, left femur, subsequent encounter for fracture with routine healing: Secondary | ICD-10-CM | POA: Diagnosis not present

## 2020-04-03 DIAGNOSIS — U071 COVID-19: Secondary | ICD-10-CM | POA: Diagnosis not present

## 2020-04-03 DIAGNOSIS — G309 Alzheimer's disease, unspecified: Secondary | ICD-10-CM | POA: Diagnosis not present

## 2020-04-03 DIAGNOSIS — N183 Chronic kidney disease, stage 3 unspecified: Secondary | ICD-10-CM | POA: Diagnosis not present

## 2020-04-06 DIAGNOSIS — S72042D Displaced fracture of base of neck of left femur, subsequent encounter for closed fracture with routine healing: Secondary | ICD-10-CM | POA: Diagnosis not present

## 2020-04-06 DIAGNOSIS — D692 Other nonthrombocytopenic purpura: Secondary | ICD-10-CM | POA: Diagnosis not present

## 2020-04-06 DIAGNOSIS — I350 Nonrheumatic aortic (valve) stenosis: Secondary | ICD-10-CM | POA: Diagnosis not present

## 2020-04-06 DIAGNOSIS — D62 Acute posthemorrhagic anemia: Secondary | ICD-10-CM | POA: Diagnosis not present

## 2020-04-06 DIAGNOSIS — S72042A Displaced fracture of base of neck of left femur, initial encounter for closed fracture: Secondary | ICD-10-CM | POA: Diagnosis not present

## 2020-04-06 DIAGNOSIS — I129 Hypertensive chronic kidney disease with stage 1 through stage 4 chronic kidney disease, or unspecified chronic kidney disease: Secondary | ICD-10-CM | POA: Diagnosis not present

## 2020-04-06 DIAGNOSIS — N1832 Chronic kidney disease, stage 3b: Secondary | ICD-10-CM | POA: Diagnosis not present

## 2020-04-07 DIAGNOSIS — G309 Alzheimer's disease, unspecified: Secondary | ICD-10-CM | POA: Diagnosis not present

## 2020-04-07 DIAGNOSIS — M80052D Age-related osteoporosis with current pathological fracture, left femur, subsequent encounter for fracture with routine healing: Secondary | ICD-10-CM | POA: Diagnosis not present

## 2020-04-07 DIAGNOSIS — Z96642 Presence of left artificial hip joint: Secondary | ICD-10-CM | POA: Diagnosis not present

## 2020-04-07 DIAGNOSIS — U071 COVID-19: Secondary | ICD-10-CM | POA: Diagnosis not present

## 2020-04-07 DIAGNOSIS — S72032D Displaced midcervical fracture of left femur, subsequent encounter for closed fracture with routine healing: Secondary | ICD-10-CM | POA: Diagnosis not present

## 2020-04-09 DIAGNOSIS — G309 Alzheimer's disease, unspecified: Secondary | ICD-10-CM | POA: Diagnosis not present

## 2020-04-09 DIAGNOSIS — Z96642 Presence of left artificial hip joint: Secondary | ICD-10-CM | POA: Diagnosis not present

## 2020-04-09 DIAGNOSIS — U071 COVID-19: Secondary | ICD-10-CM | POA: Diagnosis not present

## 2020-04-09 DIAGNOSIS — M80052D Age-related osteoporosis with current pathological fracture, left femur, subsequent encounter for fracture with routine healing: Secondary | ICD-10-CM | POA: Diagnosis not present

## 2020-04-13 DIAGNOSIS — U071 COVID-19: Secondary | ICD-10-CM | POA: Diagnosis not present

## 2020-04-13 DIAGNOSIS — Z96642 Presence of left artificial hip joint: Secondary | ICD-10-CM | POA: Diagnosis not present

## 2020-04-13 DIAGNOSIS — G309 Alzheimer's disease, unspecified: Secondary | ICD-10-CM | POA: Diagnosis not present

## 2020-04-13 DIAGNOSIS — M80052D Age-related osteoporosis with current pathological fracture, left femur, subsequent encounter for fracture with routine healing: Secondary | ICD-10-CM | POA: Diagnosis not present

## 2020-04-15 DIAGNOSIS — G309 Alzheimer's disease, unspecified: Secondary | ICD-10-CM | POA: Diagnosis not present

## 2020-04-15 DIAGNOSIS — U071 COVID-19: Secondary | ICD-10-CM | POA: Diagnosis not present

## 2020-04-15 DIAGNOSIS — Z96642 Presence of left artificial hip joint: Secondary | ICD-10-CM | POA: Diagnosis not present

## 2020-04-15 DIAGNOSIS — M80052D Age-related osteoporosis with current pathological fracture, left femur, subsequent encounter for fracture with routine healing: Secondary | ICD-10-CM | POA: Diagnosis not present

## 2020-04-21 DIAGNOSIS — G309 Alzheimer's disease, unspecified: Secondary | ICD-10-CM | POA: Diagnosis not present

## 2020-04-21 DIAGNOSIS — M80052D Age-related osteoporosis with current pathological fracture, left femur, subsequent encounter for fracture with routine healing: Secondary | ICD-10-CM | POA: Diagnosis not present

## 2020-04-21 DIAGNOSIS — U071 COVID-19: Secondary | ICD-10-CM | POA: Diagnosis not present

## 2020-04-21 DIAGNOSIS — Z96642 Presence of left artificial hip joint: Secondary | ICD-10-CM | POA: Diagnosis not present

## 2020-04-30 ENCOUNTER — Ambulatory Visit: Payer: Medicare Other | Admitting: Cardiology

## 2020-05-04 NOTE — Progress Notes (Deleted)
Evaluation Performed:  Follow-up visit  Date:  05/04/2020   ID:  [name], DOB 1931/03/23, MRN [id number]  Provider Location: Office  PCP:  Lajean Manes, MD  Cardiologist:  Jenkins Rouge, MD   Electrophysiologist:  None   Chief Complaint:  CAD/AS  History of Present Illness:    [name] is an 87 y.o.  female with a history of ASCAD s/p CABG, HTN, normal LVF, dyslipidemia and severe AS. Given advanced age and demntia not a TAVR candidate   CABG 2004 with LIMA to LAD SVG to Diagonal SVG OM and SVG PDA  I took care of her 2nd husband [name]  Sister [name] is a patient of mine as well      Last echo 11/28/19  with EF 55-60% mean gradient 53 mmHg peak 69 mmHg   She has advanced dementia and is not a TAVR candidate She has 3 boys one [name] lives with her and one lives close with her sister [name] They need to have family conference to consider assisted living for her   Had COVID vaccine x 2   ***   Past Medical History:  Diagnosis Date  . Anemia   . Aortic stenosis    mild AS by 02/18/11  [name])  . Arthritis   . Bradycardia    Asymtomatic resolved  . Cancer (HCC)    ceervical  . Chronic kidney disease    renal insufficency, stage III  . Chronic renal disease, stage III ([place])   . Coronary artery disease    CABG 2003  . Dyslipidemia    goal LSL less then 70 but statin intolerant  . GERD (gastroesophageal reflux disease)   . Hypertension   . Hypothyroidism   . Iron deficiency anemia 7/12  . Mild AI (aortic insufficiency)   . Mild aortic stenosis   . OP (osteoporosis) 10/2010   fosamax  . Statin intolerance    Past Surgical History:  Procedure Laterality Date  . ABDOMINAL HYSTERECTOMY    . CARDIAC CATHETERIZATION     2004  . cataracts    . COLONOSCOPY  09/2010   normal EGD abd COLONOSCOPY  . CORONARY ARTERY BYPASS GRAFT  2004   with LIMA to LAD, SVG to diagonal, SVG to OM, SOVG to PDA normal LVF  . HIP ARTHROPLASTY Left 03/23/2020    Procedure: ARTHROPLASTY BIPOLAR HIP (HEMIARTHROPLASTY) anterior approach;  Surgeon: Rod Can, MD;  Location: [facility];  Service: Orthopedics;  Laterality: Left;  . LUMBAR LAMINECTOMY  09/23/2011   Procedure: MICRODISCECTOMY LUMBAR LAMINECTOMY;  Surgeon: Marybelle Killings, MD;  Location: [facility];  Service: Orthopedics;  Laterality: N/A;  L4-5 Decompression     No outpatient medications have been marked as taking for the 05/18/20 encounter (Appointment) with Joann Hector, MD.     Allergies:   Aleve [naproxen sodium], Excedrin extra strength [aspirin-acetaminophen-caffeine], Statins, and Vytorin [ezetimibe-simvastatin]   Social History   Tobacco Use  . Smoking status: Never Smoker  . Smokeless tobacco: Never Used  Vaping Use  . Vaping Use: Never used  Substance Use Topics  . Alcohol use: No  . Drug use: No     Family Hx: The patient's family history includes Asthma in her father; Heart Problems in her mother.  ROS:   Please see the history of present illness.     All other systems reviewed and are negative.   Prior CV studies:   The following studies were reviewed today: Echo 01/30/17  Echo 10/04/18  Echo 04/30/19   Labs/Other Tests and Data Reviewed:    EKG:   04/07/16 SR rate 58 normal SR read as flutter but artifact rate 70 normal   Recent Labs: 03/25/2020: BUN 45; Creatinine, Ser 1.74; Hemoglobin 9.5; Platelets 162; Potassium 4.3; Sodium 133   Recent Lipid Panel Lab Results  Component Value Date/Time   CHOL 208 (H) 03/24/2014 10:08 AM   TRIG 129.0 03/24/2014 10:08 AM   HDL 44.80 03/24/2014 10:08 AM   CHOLHDL 5 03/24/2014 10:08 AM   LDLCALC 137 (H) 03/24/2014 10:08 AM    Wt Readings from Last 3 Encounters:  03/22/20 51.3 kg  11/04/19 40.8 kg  10/04/18 43.5 kg     Objective:    Vital Signs:  There were no vitals taken for this visit.  Affect appropriate Healthy:  appears stated age 32: normal Neck supple with no adenopathy JVP normal left  bruits no  thyromegaly Lungs clear with no wheezing and good diaphragmatic motion Heart:  S1/S2 AS  murmur, no rub, gallop or click PMI normal Abdomen: benighn, BS positve, no tenderness, no AAA no bruit.  No HSM or HJR Distal pulses intact with no bruits No edema Neuro non-focal Skin warm and dry Right TKR    ASSESSMENT & PLAN:    1. AS:  Severe mean gradient 53 by TTE 11/28/19  also with mild/moderate MR and normal LVEF given age and advanced dementia not a candidate for TAVR  2. CAD/CABG:  2004 no chest pain observe given age and lack of symptoms 3. HLD:  On zetia labs with primary 4. Thyroid:  On replacement labs with primary   COVID-19 Education: The signs and symptoms of COVID-19 were discussed with the patient and how to seek care for testing (follow up with PCP or arrange E-visit).  The importance of social distancing was discussed today.  Medication Adjustments/Labs and Tests Ordered: Current medicines are reviewed at length with the patient today.  Concerns regarding medicines are outlined above.  Tests Ordered:  None   Medication Changes: No orders of the defined types were placed in this encounter.   Disposition:  Follow up in 6 months   Signed, Jenkins Rouge, MD  05/04/2020 12:52 PM    [place]

## 2020-05-18 ENCOUNTER — Ambulatory Visit: Payer: Medicare Other | Admitting: Cardiovascular Disease

## 2020-06-25 ENCOUNTER — Inpatient Hospital Stay (HOSPITAL_COMMUNITY)
Admission: EM | Admit: 2020-06-25 | Discharge: 2020-06-27 | DRG: 291 | Disposition: A | Discharge status: Home / Self Care | Payer: Medicare Other | Attending: Family Medicine | Admitting: Family Medicine

## 2020-06-25 ENCOUNTER — Other Ambulatory Visit: Payer: Self-pay

## 2020-06-25 ENCOUNTER — Encounter (HOSPITAL_COMMUNITY): Payer: Self-pay | Admitting: Emergency Medicine

## 2020-06-25 ENCOUNTER — Emergency Department (HOSPITAL_COMMUNITY): Payer: Medicare Other

## 2020-06-25 DIAGNOSIS — D631 Anemia in chronic kidney disease: Secondary | ICD-10-CM | POA: Diagnosis present

## 2020-06-25 DIAGNOSIS — M1990 Unspecified osteoarthritis, unspecified site: Secondary | ICD-10-CM | POA: Diagnosis present

## 2020-06-25 DIAGNOSIS — Z96642 Presence of left artificial hip joint: Secondary | ICD-10-CM | POA: Diagnosis present

## 2020-06-25 DIAGNOSIS — Z20822 Contact with and (suspected) exposure to covid-19: Secondary | ICD-10-CM | POA: Diagnosis present

## 2020-06-25 DIAGNOSIS — E785 Hyperlipidemia, unspecified: Secondary | ICD-10-CM | POA: Diagnosis present

## 2020-06-25 DIAGNOSIS — E039 Hypothyroidism, unspecified: Secondary | ICD-10-CM | POA: Diagnosis present

## 2020-06-25 DIAGNOSIS — I350 Nonrheumatic aortic (valve) stenosis: Secondary | ICD-10-CM

## 2020-06-25 DIAGNOSIS — J9601 Acute respiratory failure with hypoxia: Secondary | ICD-10-CM | POA: Diagnosis not present

## 2020-06-25 DIAGNOSIS — Z79899 Other long term (current) drug therapy: Secondary | ICD-10-CM

## 2020-06-25 DIAGNOSIS — I130 Hypertensive heart and chronic kidney disease with heart failure and stage 1 through stage 4 chronic kidney disease, or unspecified chronic kidney disease: Secondary | ICD-10-CM | POA: Diagnosis not present

## 2020-06-25 DIAGNOSIS — Z888 Allergy status to other drugs, medicaments and biological substances status: Secondary | ICD-10-CM

## 2020-06-25 DIAGNOSIS — I352 Nonrheumatic aortic (valve) stenosis with insufficiency: Secondary | ICD-10-CM | POA: Diagnosis present

## 2020-06-25 DIAGNOSIS — J90 Pleural effusion, not elsewhere classified: Secondary | ICD-10-CM

## 2020-06-25 DIAGNOSIS — I2510 Atherosclerotic heart disease of native coronary artery without angina pectoris: Secondary | ICD-10-CM | POA: Diagnosis not present

## 2020-06-25 DIAGNOSIS — Z681 Body mass index (BMI) 19 or less, adult: Secondary | ICD-10-CM

## 2020-06-25 DIAGNOSIS — Z886 Allergy status to analgesic agent status: Secondary | ICD-10-CM | POA: Diagnosis not present

## 2020-06-25 DIAGNOSIS — I5023 Acute on chronic systolic (congestive) heart failure: Secondary | ICD-10-CM | POA: Diagnosis not present

## 2020-06-25 DIAGNOSIS — R54 Age-related physical debility: Secondary | ICD-10-CM | POA: Diagnosis not present

## 2020-06-25 DIAGNOSIS — I5033 Acute on chronic diastolic (congestive) heart failure: Secondary | ICD-10-CM | POA: Diagnosis not present

## 2020-06-25 DIAGNOSIS — N1832 Chronic kidney disease, stage 3b: Secondary | ICD-10-CM | POA: Diagnosis not present

## 2020-06-25 DIAGNOSIS — K219 Gastro-esophageal reflux disease without esophagitis: Secondary | ICD-10-CM | POA: Diagnosis not present

## 2020-06-25 DIAGNOSIS — Z825 Family history of asthma and other chronic lower respiratory diseases: Secondary | ICD-10-CM | POA: Diagnosis not present

## 2020-06-25 DIAGNOSIS — E8770 Fluid overload, unspecified: Secondary | ICD-10-CM | POA: Diagnosis not present

## 2020-06-25 DIAGNOSIS — Z951 Presence of aortocoronary bypass graft: Secondary | ICD-10-CM

## 2020-06-25 DIAGNOSIS — Z8249 Family history of ischemic heart disease and other diseases of the circulatory system: Secondary | ICD-10-CM

## 2020-06-25 DIAGNOSIS — I509 Heart failure, unspecified: Secondary | ICD-10-CM

## 2020-06-25 DIAGNOSIS — I10 Essential (primary) hypertension: Secondary | ICD-10-CM | POA: Diagnosis present

## 2020-06-25 DIAGNOSIS — I110 Hypertensive heart disease with heart failure: Secondary | ICD-10-CM | POA: Diagnosis not present

## 2020-06-25 DIAGNOSIS — R0602 Shortness of breath: Secondary | ICD-10-CM | POA: Diagnosis not present

## 2020-06-25 DIAGNOSIS — Z79890 Hormone replacement therapy (postmenopausal): Secondary | ICD-10-CM

## 2020-06-25 LAB — CBC WITH DIFFERENTIAL/PLATELET
Abs Immature Granulocytes: 0.05 10*3/uL (ref 0.00–0.07)
Basophils Absolute: 0 10*3/uL (ref 0.0–0.1)
Basophils Relative: 0 %
Eosinophils Absolute: 0 10*3/uL (ref 0.0–0.5)
Eosinophils Relative: 0 %
HCT: 33.9 % — ABNORMAL LOW (ref 36.0–46.0)
Hemoglobin: 10.9 g/dL — ABNORMAL LOW (ref 12.0–15.0)
Immature Granulocytes: 1 %
Lymphocytes Relative: 9 %
Lymphs Abs: 1 10*3/uL (ref 0.7–4.0)
MCH: 30.5 pg (ref 26.0–34.0)
MCHC: 32.2 g/dL (ref 30.0–36.0)
MCV: 95 fL (ref 80.0–100.0)
Monocytes Absolute: 0.8 10*3/uL (ref 0.1–1.0)
Monocytes Relative: 7 %
Neutro Abs: 8.7 10*3/uL — ABNORMAL HIGH (ref 1.7–7.7)
Neutrophils Relative %: 83 %
Platelets: 218 10*3/uL (ref 150–400)
RBC: 3.57 MIL/uL — ABNORMAL LOW (ref 3.87–5.11)
RDW: 14.5 % (ref 11.5–15.5)
WBC: 10.5 10*3/uL (ref 4.0–10.5)
nRBC: 0 % (ref 0.0–0.2)

## 2020-06-25 LAB — BRAIN NATRIURETIC PEPTIDE: B Natriuretic Peptide: >4500 pg/mL — ABNORMAL HIGH (ref 0.0–100.0)

## 2020-06-25 LAB — RESP PANEL BY RT-PCR (FLU A&B, COVID) ARPGX2
Influenza A by PCR: NEGATIVE
Influenza B by PCR: NEGATIVE
SARS Coronavirus 2 by RT PCR: NEGATIVE

## 2020-06-25 LAB — TROPONIN I (HIGH SENSITIVITY): Troponin I (High Sensitivity): 74 ng/L — ABNORMAL HIGH (ref <18)

## 2020-06-25 MED ORDER — ACETAMINOPHEN 650 MG RE SUPP: 650.0000 mg | Freq: Four times a day (QID) | RECTAL | Status: DC | PRN

## 2020-06-25 MED ORDER — ACETAMINOPHEN 325 MG PO TABS: 650.0000 mg | ORAL_TABLET | Freq: Four times a day (QID) | ORAL | Status: DC | PRN

## 2020-06-25 MED ORDER — FUROSEMIDE 10 MG/ML IJ SOLN
40.0000 mg | Freq: Once | INTRAMUSCULAR | Status: AC
  Administered 2020-06-25: 40 mg via INTRAVENOUS
  Filled 2020-06-25: qty 4

## 2020-06-25 NOTE — ED Provider Notes (Addendum)
Emergency Medicine Provider Triage Evaluation Note  [name] , a 87 y.o. female  was evaluated in triage.  Pt complains of weakness and shortness of breath.  Has cough which is nonproductive.  States follows with cardiology however does know why. Mild chest tightness. No hx of COPD. No LE edema, back pain, abd pain. No covid contacts. No urinary complaints.  Hx of severe AS, not a TAVR candidate per cards note  Hx of dementia  Review of Systems  Positive: SOB, chest tightness, weakness Negative: Fever, LE edema  Physical Exam  BP (!) 136/107 (BP Location: Left Arm)   Pulse 89   Resp 16   SpO2 97%  Gen:   Awake, no distress   Cardiac: Murmur, 2+ radial pulses bilaterally Resp:  Normal effort, decreased breath sounds bilaterally MSK:   Moves extremities without difficulty, no obvious LE edema Abd:  Soft non tender Other:    Medical Decision Making  Medically screening exam initiated at 7:16 PM.  Appropriate orders placed.  [name] was informed that the remainder of the evaluation will be completed by another provider, this initial triage assessment does not replace that evaluation, and the importance of remaining in the ED until their evaluation is complete.  Weakness, SOB  Labs and imaging ordered        Silvanna Ohmer A, PA-C 06/25/20 1918    Dorie Rank, MD 06/25/20 2325

## 2020-06-25 NOTE — ED Provider Notes (Addendum)
[name] EMERGENCY DEPARTMENT Provider Note   CSN: [id number] Arrival date & time: 06/25/20  1908     History Chief Complaint  Patient presents with   Shortness of Breath    [name] is a 87 y.o. female.  Past medical history CKD, CAD s/p CABG in 20 oh thousand and 3, hypertension, hyperlipidemia, mild aortic stenosis presenting to ER with concern for shortness of breath.  Patient reports over the past few months she has dealt with shortness of breath.  Generally worse with exertion and improved with rest.  Has been worsening over the last few days.  No cough or congestion, fever.  No known sick contacts.  No chest pain.  Has not noted any leg pain or leg swelling.  HPI     Past Medical History:  Diagnosis Date   Anemia    Aortic stenosis    mild AS by 02/18/11  [name]   Arthritis    Bradycardia    Asymtomatic resolved   Cancer ([place])    ceervical   Chronic kidney disease    renal insufficency, stage III   Chronic renal disease, stage III ([place])    Coronary artery disease    CABG 2003   Dyslipidemia    goal LSL less then 70 but statin intolerant   GERD (gastroesophageal reflux disease)    Hypertension    Hypothyroidism    Iron deficiency anemia 7/12   Mild AI (aortic insufficiency)    Mild aortic stenosis    OP (osteoporosis) 10/2010   fosamax   Statin intolerance     Patient Active Problem List   Diagnosis Date Noted   Fall    Left displaced femoral neck fracture ([place]) 03/22/2020   Renal insufficiency 03/22/2020   Coronary atherosclerosis of native coronary artery 04/23/2013   Essential hypertension, benign 04/23/2013   Pure hypercholesterolemia 04/23/2013   Aortic stenosis    Spinal stenosis, lumbar 09/23/2011    Class: Diagnosis of    Past Surgical History:  Procedure Laterality Date   ABDOMINAL HYSTERECTOMY     CARDIAC CATHETERIZATION     2004   cataracts     COLONOSCOPY  09/2010   normal EGD abd COLONOSCOPY   CORONARY  ARTERY BYPASS GRAFT  2004   with LIMA to LAD, SVG to diagonal, SVG to OM, SOVG to PDA normal LVF   HIP ARTHROPLASTY Left 03/23/2020   Procedure: ARTHROPLASTY BIPOLAR HIP (HEMIARTHROPLASTY) anterior approach;  Surgeon: Rod Can, MD;  Location: [facility];  Service: Orthopedics;  Laterality: Left;   LUMBAR LAMINECTOMY  09/23/2011   Procedure: MICRODISCECTOMY LUMBAR LAMINECTOMY;  Surgeon: Marybelle Killings, MD;  Location: [facility];  Service: Orthopedics;  Laterality: N/A;  L4-5 Decompression     OB History   No obstetric history on file.     Family History  Problem Relation Age of Onset   Heart Problems Mother    Asthma Father     Social History   Tobacco Use   Smoking status: Never   Smokeless tobacco: Never  Vaping Use   Vaping Use: Never used  Substance Use Topics   Alcohol use: No   Drug use: No    Home Medications Prior to Admission medications   Medication Sig Start Date End Date Taking? Authorizing Provider  calcium gluconate 500 MG tablet Take 1 tablet by mouth at bedtime.     [provider]  carvedilol (COREG) 6.25 MG tablet Take 1 tablet (6.25 mg total) by mouth  2 (two) times daily with a meal. 03/25/20   Hongalgi, Lenis Dickinson, MD  Cyanocobalamin (B-12 PO) Take 1 tablet by mouth daily.    [provider]  docusate sodium (COLACE) 100 MG capsule Take 1 capsule (100 mg total) by mouth 2 (two) times daily. 03/25/20   Hongalgi, Lenis Dickinson, MD  donepezil (ARICEPT) 10 MG tablet Take 5 mg by mouth at bedtime. 04/17/18   [provider]  ezetimibe (ZETIA) 10 MG tablet Take 10 mg by mouth at bedtime.     [provider]  ferrous sulfate 325 (65 FE) MG tablet Take 325 mg by mouth at bedtime.    [provider]  levothyroxine (SYNTHROID, LEVOTHROID) 75 MCG tablet Take 75 mcg by mouth at bedtime.    [provider]  Multiple Vitamin (MULTIVITAMIN WITH MINERALS) TABS tablet Take 1 tablet by mouth daily.    [provider]  Vitamin D,  Ergocalciferol, (DRISDOL) 50000 UNITS CAPS Take 50,000 Units by mouth every Monday.     [provider]    Allergies    Aleve [naproxen sodium], Excedrin extra strength [aspirin-acetaminophen-caffeine], Statins, and Vytorin [ezetimibe-simvastatin]  Review of Systems   Review of Systems  Constitutional:  Negative for chills and fever.  HENT:  Negative for ear pain and sore throat.   Eyes:  Negative for pain and visual disturbance.  Respiratory:  Positive for shortness of breath. Negative for cough.   Cardiovascular:  Negative for chest pain and palpitations.  Gastrointestinal:  Negative for abdominal pain and vomiting.  Genitourinary:  Negative for dysuria and hematuria.  Musculoskeletal:  Negative for arthralgias and back pain.  Skin:  Negative for color change and rash.  Neurological:  Negative for seizures and syncope.  All other systems reviewed and are negative.  Physical Exam Updated Vital Signs BP (!) 136/107 (BP Location: Left Arm)   Pulse 89   Resp 16   Ht 5' (1.524 m)   Wt 44.5 kg   SpO2 97%   BMI 19.14 kg/m   Physical Exam Vitals and nursing note reviewed.  Constitutional:      General: She is not in acute distress.    Appearance: She is well-developed.  HENT:     Head: Normocephalic and atraumatic.  Eyes:     Conjunctiva/sclera: Conjunctivae normal.  Cardiovascular:     Rate and Rhythm: Normal rate and regular rhythm.     Heart sounds: No murmur heard. Pulmonary:     Comments: Somewhat tachypneic, speaks in full sentences, diminished breath sounds at bases bilaterally Abdominal:     Palpations: Abdomen is soft.     Tenderness: There is no abdominal tenderness.  Musculoskeletal:     Cervical back: Neck supple.  Skin:    General: Skin is warm and dry.  Neurological:     Mental Status: She is alert.    ED Results / Procedures / Treatments   Labs (all labs ordered are listed, but only abnormal results are displayed) Labs Reviewed  CBC WITH  DIFFERENTIAL/PLATELET - Abnormal; Notable for the following components:      Result Value   RBC 3.57 (*)    Hemoglobin 10.9 (*)    HCT 33.9 (*)    Neutro Abs 8.7 (*)    All other components within normal limits  BRAIN NATRIURETIC PEPTIDE - Abnormal; Notable for the following components:   B Natriuretic Peptide >4,500.0 (*)    All other components within normal limits  TROPONIN I (HIGH SENSITIVITY) - Abnormal; Notable  for the following components:   Troponin I (High Sensitivity) 74 (*)    All other components within normal limits  RESP PANEL BY RT-PCR (FLU A&B, COVID) ARPGX2  TROPONIN I (HIGH SENSITIVITY)    EKG EKG Interpretation  Date/Time:  Thursday June 25 2020 19:18:33 EDT Ventricular Rate:  86 PR Interval:  138 QRS Duration: 80 QT Interval:  372 QTC Calculation: 445 R Axis:   86 Text Interpretation: Sinus rhythm with Premature atrial complexes Possible Left atrial enlargement Left ventricular hypertrophy with repolarization abnormality ( Sokolow-Lyon , Cornell product , Romhilt-Estes ) Abnormal ECG Confirmed by Madalyn Rob [phone]) on 06/25/2020 10:51:03 PM  Radiology DG Chest 2 View  Result Date: 06/25/2020 CLINICAL DATA:  Shortness of breath x3 days. EXAM: CHEST - 2 VIEW COMPARISON:  March 22, 2020 FINDINGS: Multiple sternal wires are seen. Mild to moderate severity areas of atelectasis and/or infiltrate are noted within the bilateral lung bases. Small bilateral pleural effusions are seen. No pneumothorax is identified. The cardiac silhouette is borderline in size. There is marked severity calcification of the thoracic aorta no acute osseous abnormalities are identified. IMPRESSION: 1. Mild to moderate severity bibasilar atelectasis and/or infiltrate. 2. Small bilateral pleural effusions. Electronically Signed   By: Virgina Norfolk M.D.   On: 06/25/2020 21:41    Procedures Ultrasound ED Echo  Date/Time: 06/25/2020 10:53 PM Performed by: Lucrezia Starch, MD Authorized  by: Lucrezia Starch, MD   Procedure details:    Indications: dyspnea     Views: subxiphoid, parasternal long axis view, parasternal short axis view and apical 4 chamber view     Images: archived   Findings:    Pericardium: no pericardial effusion     Cardiac Activity comment:  Severely decreased LV function   LV Function: severly depressed (<30%)     RV Diameter: normal     Other signs of RV strain comment:  No signs of RV strain Impression:    Impression: decreased contractility     Medications Ordered in ED Medications  furosemide (LASIX) injection 40 mg (has no administration in time range)    ED Course  I have reviewed the triage vital signs and the nursing notes.  Pertinent labs & imaging results that were available during my care of the patient were reviewed by me and considered in my medical decision making (see chart for details).    MDM Rules/Calculators/A&P                          87 year old lady presenting to ER with concern for shortness of breath.  On exam patient appeared somewhat tachypneic but not in distress.  No hypoxia.  CBC stable.  EKG does not have any new ischemic change, troponin was mildly elevated, no chest pain.  Lower suspicion for ACS at present.  Chest x-ray noted to have significant bibasilar atelectasis versus infiltrate as well as bilateral pleural effusions.  BNP was profoundly elevated.  Performed bedside echocardiogram and she was noted to have significantly reduced LV function.  No signs of RV strain.  Last EF was documented to be normal.  Concern for new onset heart failure.  Will consult medicine for admission, will initiate diuresis, will need formal echo.  Discussed with Hal Hope. BMP pending at time of admit - he will follow.   Final Clinical Impression(s) / ED Diagnoses Final diagnoses:  Acute on chronic systolic heart failure (HCC)  Pleural effusion  Hypervolemia, unspecified hypervolemia type  Rx / DC Orders ED Discharge  Orders     None        Lucrezia Starch, MD 06/25/20 2254    Lucrezia Starch, MD 06/25/20 2312

## 2020-06-25 NOTE — H&P (Signed)
History and Physical    [name] ZSW:[id number] DOB: 07-22-1931 DOA: 06/25/2020  PCP: Lajean Manes, MD  Patient coming from: Home.  Chief Complaint: Shortness of breath.  HPI: [name] is a 87 y.o. female with history of CAD status post CABG severe aortic stenosis not a candidate for TAVR, chronic kidney disease stage IV, anemia, hypertension presents to the ER because of worsening shortness of breath.  Patient states he has been short of breath mostly on exertion for the last few days.  Denies chest pain productive cough fever or chills.  ED Course: In the ER patient was found to be short of breath with chest x-ray showing bilateral pleural effusion BNP was more than 4500 creatinine is around 1.6 COVID test negative hemoglobin 10.9.  High sensitive troponins are 74 and 82.  Patient was given IV Lasix and admitted for acute CHF.  Review of Systems: As per HPI, rest all negative.   Past Medical History:  Diagnosis Date   Anemia    Aortic stenosis    mild AS by 02/18/11  [name]   Arthritis    Bradycardia    Asymtomatic resolved   Cancer ([place])    ceervical   Chronic kidney disease    renal insufficency, stage III   Chronic renal disease, stage III ([place])    Coronary artery disease    CABG 2003   Dyslipidemia    goal LSL less then 70 but statin intolerant   GERD (gastroesophageal reflux disease)    Hypertension    Hypothyroidism    Iron deficiency anemia 7/12   Mild AI (aortic insufficiency)    Mild aortic stenosis    OP (osteoporosis) 10/2010   fosamax   Statin intolerance     Past Surgical History:  Procedure Laterality Date   ABDOMINAL HYSTERECTOMY     CARDIAC CATHETERIZATION     2004   cataracts     COLONOSCOPY  09/2010   normal EGD abd COLONOSCOPY   CORONARY ARTERY BYPASS GRAFT  2004   with LIMA to LAD, SVG to diagonal, SVG to OM, SOVG to PDA normal LVF   HIP ARTHROPLASTY Left 03/23/2020   Procedure: ARTHROPLASTY BIPOLAR HIP (HEMIARTHROPLASTY)  anterior approach;  Surgeon: Rod Can, MD;  Location: [facility];  Service: Orthopedics;  Laterality: Left;   LUMBAR LAMINECTOMY  09/23/2011   Procedure: MICRODISCECTOMY LUMBAR LAMINECTOMY;  Surgeon: Marybelle Killings, MD;  Location: [facility];  Service: Orthopedics;  Laterality: N/A;  L4-5 Decompression     reports that she has never smoked. She has never used smokeless tobacco. She reports that she does not drink alcohol and does not use drugs.  Allergies  Allergen Reactions   Aleve [Naproxen Sodium] Hives   Aleve [Naproxen]     Other reaction(s): Unknown   Crestor [Rosuvastatin]     Other reaction(s): lethargy   Excedrin Extra Strength [Aspirin-Acetaminophen-Caffeine] Nausea And Vomiting   Meloxicam     Other reaction(s): felt poorly   Statins Other (See Comments)    No energy, very weak   Vytorin [Ezetimibe-Simvastatin] Other (See Comments)    Fatigue     Family History  Problem Relation Age of Onset   Heart Problems Mother    Asthma Father     Prior to Admission medications   Medication Sig Start Date End Date Taking? Authorizing Provider  amLODipine (NORVASC) 5 MG tablet Take 5 mg by mouth daily. 02/07/17  Yes [provider]  aspirin 81 MG chewable tablet Chew  81 mg by mouth daily.   Yes [provider]  calcium gluconate 500 MG tablet Take 1 tablet by mouth at bedtime.    Yes [provider]  carvedilol (COREG) 6.25 MG tablet Take 1 tablet (6.25 mg total) by mouth 2 (two) times daily with a meal. 03/25/20  Yes Hongalgi, Lenis Dickinson, MD  Cyanocobalamin (B-12 PO) Take 1 tablet by mouth daily.   Yes [provider]  ezetimibe (ZETIA) 10 MG tablet Take 10 mg by mouth at bedtime.    Yes [provider]  ferrous sulfate 325 (65 FE) MG tablet Take 325 mg by mouth at bedtime.   Yes [provider]  HYDROcodone-acetaminophen (NORCO) 10-325 MG tablet Take 0.5-1 tablets by mouth every 4 (four) hours as needed for moderate pain.   Yes  [provider]  levothyroxine (SYNTHROID, LEVOTHROID) 75 MCG tablet Take 75 mcg by mouth at bedtime.   Yes [provider]  Multiple Vitamin (MULTIVITAMIN WITH MINERALS) TABS tablet Take 1 tablet by mouth daily.   Yes [provider]  Vitamin D, Ergocalciferol, (DRISDOL) 50000 UNITS CAPS Take 50,000 Units by mouth every Monday.    Yes [provider]  docusate sodium (COLACE) 100 MG capsule Take 1 capsule (100 mg total) by mouth 2 (two) times daily. Patient not taking: No sig reported 03/25/20   Modena Jansky, MD    Physical Exam: Constitutional: Moderately built and nourished. Vitals:   06/25/20 1915 06/25/20 1930 06/25/20 2230 06/25/20 2315  BP: (!) 136/107  (!) 146/100   Pulse: 89  (!) 139   Resp: 16  18   Temp:    97.6 F (36.4 C)  TempSrc:    Oral  SpO2: 97%  92%   Weight:  44.5 kg    Height:  5' (1.524 m)     Eyes: Anicteric no pallor. ENMT: No discharge from the ears eyes nose and mouth. Neck: JVD elevated no mass felt. Respiratory: No rhonchi or crepitations. Cardiovascular: S1-S2 heard. Abdomen: Soft nontender bowel sound present. Musculoskeletal: No edema. Skin: No rash. Neurologic: Alert awake oriented time place and person.  Moves all extremities. Psychiatric: Appears normal.  Normal affect.   Labs on Admission: I have personally reviewed following labs and imaging studies  CBC: Recent Labs  Lab 06/25/20 1914  WBC 10.5  NEUTROABS 8.7*  HGB 10.9*  HCT 33.9*  MCV 95.0  PLT 101   Basic Metabolic Panel: No results for input(s): NA, K, CL, CO2, GLUCOSE, BUN, CREATININE, CALCIUM, MG, PHOS in the last 168 hours. GFR: CrCl cannot be calculated (Patient's most recent lab result is older than the maximum 21 days allowed.). Liver Function Tests: No results for input(s): AST, ALT, ALKPHOS, BILITOT, PROT, ALBUMIN in the last 168 hours. No results for input(s): LIPASE, AMYLASE in the last 168 hours. No results for input(s):  AMMONIA in the last 168 hours. Coagulation Profile: No results for input(s): INR, PROTIME in the last 168 hours. Cardiac Enzymes: No results for input(s): CKTOTAL, CKMB, CKMBINDEX, TROPONINI in the last 168 hours. BNP (last 3 results) No results for input(s): PROBNP in the last 8760 hours. HbA1C: No results for input(s): HGBA1C in the last 72 hours. CBG: No results for input(s): GLUCAP in the last 168 hours. Lipid Profile: No results for input(s): CHOL, HDL, LDLCALC, TRIG, CHOLHDL, LDLDIRECT in the last 72 hours. Thyroid Function Tests: No results for input(s): TSH, T4TOTAL, FREET4, T3FREE, THYROIDAB in the last 72 hours. Anemia Panel: No results for  input(s): VITAMINB12, FOLATE, FERRITIN, TIBC, IRON, RETICCTPCT in the last 72 hours. Urine analysis: No results found for: COLORURINE, APPEARANCEUR, LABSPEC, PHURINE, GLUCOSEU, HGBUR, BILIRUBINUR, KETONESUR, PROTEINUR, UROBILINOGEN, NITRITE, LEUKOCYTESUR Sepsis Labs: @LABRCNTIP (procalcitonin:4,lacticidven:4) ) Recent Results (from the past 240 hour(s))  Resp Panel by RT-PCR (Flu A&B, Covid) Nasopharyngeal Swab     Status: None   Collection Time: 06/25/20  7:15 PM   Specimen: Nasopharyngeal Swab; Nasopharyngeal(NP) swabs in vial transport medium  Result Value Ref Range Status   SARS Coronavirus 2 by RT PCR NEGATIVE NEGATIVE Final    Comment: (NOTE) SARS-CoV-2 target nucleic acids are NOT DETECTED.  The SARS-CoV-2 RNA is generally detectable in upper respiratory specimens during the acute phase of infection. The lowest concentration of SARS-CoV-2 viral copies this assay can detect is 138 copies/mL. A negative result does not preclude SARS-Cov-2 infection and should not be used as the sole basis for treatment or other patient management decisions. A negative result may occur with  improper specimen collection/handling, submission of specimen other than nasopharyngeal swab, presence of viral mutation(s) within the areas targeted by  this assay, and inadequate number of viral copies(<138 copies/mL). A negative result must be combined with clinical observations, patient history, and epidemiological information. The expected result is Negative.  Fact Sheet for Patients:  EntrepreneurPulse.com.au  Fact Sheet for Healthcare Providers:  IncredibleEmployment.be  This test is no t yet approved or cleared by the [place] FDA and  has been authorized for detection and/or diagnosis of SARS-CoV-2 by FDA under an Emergency Use Authorization (EUA). This EUA will remain  in effect (meaning this test can be used) for the duration of the COVID-19 declaration under Section 564(b)(1) of the Act, 21 U.S.C.section 360bbb-3(b)(1), unless the authorization is terminated  or revoked sooner.       Influenza A by PCR NEGATIVE NEGATIVE Final   Influenza B by PCR NEGATIVE NEGATIVE Final    Comment: (NOTE) The Xpert Xpress SARS-CoV-2/FLU/RSV plus assay is intended as an aid in the diagnosis of influenza from Nasopharyngeal swab specimens and should not be used as a sole basis for treatment. Nasal washings and aspirates are unacceptable for Xpert Xpress SARS-CoV-2/FLU/RSV testing.  Fact Sheet for Patients: EntrepreneurPulse.com.au  Fact Sheet for Healthcare Providers: IncredibleEmployment.be  This test is not yet approved or cleared by the [place] FDA and has been authorized for detection and/or diagnosis of SARS-CoV-2 by FDA under an Emergency Use Authorization (EUA). This EUA will remain in effect (meaning this test can be used) for the duration of the COVID-19 declaration under Section 564(b)(1) of the Act, 21 U.S.C. section 360bbb-3(b)(1), unless the authorization is terminated or revoked.  Performed at [facility], [place] [address] [place], [place] [address]      Radiological Exams on Admission: DG Chest 2 View  Result Date:  06/25/2020 CLINICAL DATA:  Shortness of breath x3 days. EXAM: CHEST - 2 VIEW COMPARISON:  March 22, 2020 FINDINGS: Multiple sternal wires are seen. Mild to moderate severity areas of atelectasis and/or infiltrate are noted within the bilateral lung bases. Small bilateral pleural effusions are seen. No pneumothorax is identified. The cardiac silhouette is borderline in size. There is marked severity calcification of the thoracic aorta no acute osseous abnormalities are identified. IMPRESSION: 1. Mild to moderate severity bibasilar atelectasis and/or infiltrate. 2. Small bilateral pleural effusions. Electronically Signed   By: Virgina Norfolk M.D.   On: 06/25/2020 21:41    EKG: Independently reviewed.  Normal sinus rhythm.  Assessment/Plan Principal Problem:  Acute on chronic diastolic (congestive) heart failure (HCC) Active Problems:   Coronary atherosclerosis of native coronary artery   Essential hypertension, benign   Aortic stenosis   Acute CHF (congestive heart failure) (HCC)    Acute on chronic diastolic CHF with history of severe aortic stenosis not a candidate for TAVR as per the cardiology notes was given 1 dose of IV Lasix.  Based on response and patient's hemodynamics will consider further dose of Lasix.  Closely monitor intake output Daily weights and metabolic panel. Hypertension on amlodipine Coreg. Chronic kidney disease stage III creatinine appears to be at baseline. Anemia likely from renal disease follow CBC. History of CAD status post CABG denies any chest pain.  On aspirin Zetia and Coreg.  Given the CHF symptoms with history of severe aortic stenosis will need close monitoring and inpatient status.   DVT prophylaxis: Heparin. Code Status: Full code. Family Communication: Discussed with patient. Disposition Plan: Home. Consults called: None. Admission status: Inpatient.   Rise Patience MD [facility] Pager [phone].  If 7PM-7AM, please contact  night-coverage [email or web address] Password [facility]  06/25/2020, 11:29 PM

## 2020-06-25 NOTE — ED Triage Notes (Signed)
P/t c/o SOB x3 days. No acute distress in triage. Denies Chest pain, fever, chills, cough, and sick exposure.

## 2020-06-26 LAB — BASIC METABOLIC PANEL
Anion gap: 13 (ref 5–15)
Anion gap: 12 (ref 5–15)
BUN: 41 mg/dL — ABNORMAL HIGH (ref 8–23)
BUN: 44 mg/dL — ABNORMAL HIGH (ref 8–23)
CO2: 19 mmol/L — ABNORMAL LOW (ref 22–32)
CO2: 19 mmol/L — ABNORMAL LOW (ref 22–32)
Calcium: 9.6 mg/dL (ref 8.9–10.3)
Calcium: 9.3 mg/dL (ref 8.9–10.3)
Chloride: 104 mmol/L (ref 98–111)
Chloride: 106 mmol/L (ref 98–111)
Creatinine, Ser: 1.6 mg/dL — ABNORMAL HIGH (ref 0.44–1.00)
Creatinine, Ser: 1.64 mg/dL — ABNORMAL HIGH (ref 0.44–1.00)
GFR, Estimated: 31 mL/min — ABNORMAL LOW (ref >60)
GFR, Estimated: 30 mL/min — ABNORMAL LOW (ref >60)
Glucose, Bld: 109 mg/dL — ABNORMAL HIGH (ref 70–99)
Glucose, Bld: 83 mg/dL (ref 70–99)
Potassium: 4.4 mmol/L (ref 3.5–5.1)
Potassium: 4.9 mmol/L (ref 3.5–5.1)
Sodium: 136 mmol/L (ref 135–145)
Sodium: 137 mmol/L (ref 135–145)

## 2020-06-26 LAB — CBC
HCT: 32.5 % — ABNORMAL LOW (ref 36.0–46.0)
Hemoglobin: 10.3 g/dL — ABNORMAL LOW (ref 12.0–15.0)
MCH: 30.5 pg (ref 26.0–34.0)
MCHC: 31.7 g/dL (ref 30.0–36.0)
MCV: 96.2 fL (ref 80.0–100.0)
Platelets: 190 10*3/uL (ref 150–400)
RBC: 3.38 MIL/uL — ABNORMAL LOW (ref 3.87–5.11)
RDW: 14.6 % (ref 11.5–15.5)
WBC: 8.5 10*3/uL (ref 4.0–10.5)
nRBC: 0 % (ref 0.0–0.2)

## 2020-06-26 LAB — TSH: TSH: 4.457 u[IU]/mL (ref 0.350–4.500)

## 2020-06-26 LAB — TROPONIN I (HIGH SENSITIVITY): Troponin I (High Sensitivity): 82 ng/L — ABNORMAL HIGH (ref <18)

## 2020-06-26 MED ORDER — FUROSEMIDE 10 MG/ML IJ SOLN
40.0000 mg | Freq: Once | INTRAMUSCULAR | Status: AC
  Administered 2020-06-26: 40 mg via INTRAVENOUS
  Filled 2020-06-26: qty 4

## 2020-06-26 MED ORDER — EZETIMIBE 10 MG PO TABS
10.0000 mg | ORAL_TABLET | Freq: Every day | ORAL | Status: DC
  Administered 2020-06-26: 10 mg via ORAL
  Filled 2020-06-26: qty 1

## 2020-06-26 MED ORDER — CARVEDILOL 6.25 MG PO TABS
6.2500 mg | ORAL_TABLET | Freq: Two times a day (BID) | ORAL | Status: DC
  Administered 2020-06-26 – 2020-06-27 (×3): 6.25 mg via ORAL
  Filled 2020-06-26 (×3): qty 1

## 2020-06-26 MED ORDER — AMLODIPINE BESYLATE 5 MG PO TABS
5.0000 mg | ORAL_TABLET | Freq: Every day | ORAL | Status: DC
  Administered 2020-06-26 – 2020-06-27 (×2): 5 mg via ORAL
  Filled 2020-06-26 (×2): qty 1

## 2020-06-26 MED ORDER — LEVOTHYROXINE SODIUM 75 MCG PO TABS
75.0000 ug | ORAL_TABLET | Freq: Every day | ORAL | Status: DC
  Administered 2020-06-26: 75 ug via ORAL
  Filled 2020-06-26: qty 1

## 2020-06-26 MED ORDER — FERROUS SULFATE 325 (65 FE) MG PO TABS
325.0000 mg | ORAL_TABLET | Freq: Every day | ORAL | Status: DC
  Administered 2020-06-26: 325 mg via ORAL
  Filled 2020-06-26: qty 1

## 2020-06-26 MED ORDER — HEPARIN SODIUM (PORCINE) 5000 UNIT/ML IJ SOLN
5000.0000 [IU] | Freq: Three times a day (TID) | INTRAMUSCULAR | Status: DC
  Administered 2020-06-26 – 2020-06-27 (×4): 5000 [IU] via SUBCUTANEOUS
  Filled 2020-06-26 (×4): qty 1

## 2020-06-26 MED ORDER — ASPIRIN 81 MG PO CHEW
81.0000 mg | CHEWABLE_TABLET | Freq: Every day | ORAL | Status: DC
  Administered 2020-06-26 – 2020-06-27 (×2): 81 mg via ORAL
  Filled 2020-06-26 (×2): qty 1

## 2020-06-26 NOTE — Progress Notes (Addendum)
PROGRESS NOTE    [name]  GHW:[id number] DOB: 1932/01/02 DOA: 06/25/2020 PCP: Lajean Manes, MD   Brief Narrative:  HPI: [name] is a 87 y.o. female with history of CAD status post CABG severe aortic stenosis not a candidate for TAVR, chronic kidney disease stage IV, anemia, hypertension presents to the ER because of worsening shortness of breath.  Patient states he has been short of breath mostly on exertion for the last few days.  Denies chest pain productive cough fever or chills.   ED Course: In the ER patient was found to be short of breath with chest x-ray showing bilateral pleural effusion BNP was more than 4500 creatinine is around 1.6 COVID test negative hemoglobin 10.9.  High sensitive troponins are 74 and 82.  Patient was given IV Lasix and admitted for acute CHF.  Assessment & Plan:   Principal Problem:   Acute on chronic diastolic (congestive) heart failure (HCC) Active Problems:   Coronary atherosclerosis of native coronary artery   Essential hypertension, benign   Aortic stenosis   Acute CHF (congestive heart failure) (HCC)   Acute hypoxic respiratory failure secondary to acute on chronic diastolic CHF/severe aortic stenosis: Currently on 2 L oxygen, feels much better, no shortness of breath.  Saturating 100%.  Lungs clear to auscultation.  Chest x-ray yesterday also did not show pulmonary edema.  Although BNP elevated.  I am not impressed that her symptoms are secondary to CHF but could very well be severe aortic stenosis.  For some reason, there is no strict intake and output documented.  I have ordered those for the nurses so from now on, they should be monitoring strict intake and output.  I am also going to give her 1 more dose of IV Lasix 40 mg x 1.  We will try to wean her oxygen.  CKD stage IIIb: At baseline.  Monitor.  Debility: PT OT consulted.  Essential hypertension: Controlled.  Continue amlodipine.  History of CAD with CABG: Continue  aspirin, Coreg.  Acquired hypothyroidism: Continue Synthroid.  Hyperlipidemia: Continue Zetia.   DVT prophylaxis: heparin injection 5,000 Units Start: 06/26/20 0600 SCDs Start: 06/25/20 2329   Code Status: Full Code  Family Communication:  None present at bedside.  Plan of care discussed with patient in length and he verbalized understanding and agreed with it.  Also discussed with the son over the phone.  Status is: Inpatient  Remains inpatient appropriate because:Inpatient level of care appropriate due to severity of illness  Dispo: The patient is from: Home              Anticipated d/c is to: Home              Patient currently is not medically stable to d/c.   Difficult to place patient No        Estimated body mass index is 19.16 kg/m as calculated from the following:   Height as of this encounter: 5' (1.524 m).   Weight as of this encounter: 44.5 kg.      Nutritional status:               Consultants:  None  Procedures:  None  Antimicrobials:  Anti-infectives (From admission, onward)    None          Subjective: Seen and examined.  She states that her breathing is better.  No chest pain.  No other complaint.  Objective: Vitals:   06/26/20 0245 06/26/20 0320 06/26/20 0603 06/26/20  0741  BP: 131/79  (!) 141/86 (!) 155/89  Pulse: 78  72 72  Resp: (!) 30  20 18   Temp:   97.6 F (36.4 C) (!) 97.4 F (36.3 C)  TempSrc:   Oral Oral  SpO2: 100%  100% 100%  Weight:  44.5 kg    Height:  5' (1.524 m)      Intake/Output Summary (Last 24 hours) at 06/26/2020 1027 Last data filed at 06/26/2020 0751 Gross per 24 hour  Intake 0 ml  Output 200 ml  Net -200 ml   Filed Weights   06/25/20 1930 06/26/20 0320  Weight: 44.5 kg 44.5 kg    Examination:  General exam: Appears calm and comfortable  Respiratory system: Clear to auscultation. Respiratory effort normal. Cardiovascular system: S1 & S2 heard, RRR. No JVD, 4/6 systolic murmur, rubs,  gallops or clicks. No pedal edema. Gastrointestinal system: Abdomen is nondistended, soft and nontender. No organomegaly or masses felt. Normal bowel sounds heard. Central nervous system: Alert and oriented. No focal neurological deficits. Extremities: Symmetric 5 x 5 power. Skin: No rashes, lesions or ulcers Psychiatry: Judgement and insight appear normal. Mood & affect appropriate.    Data Reviewed: I have personally reviewed following labs and imaging studies  CBC: Recent Labs  Lab 06/25/20 1914 06/26/20 0414  WBC 10.5 8.5  NEUTROABS 8.7*  --   HGB 10.9* 10.3*  HCT 33.9* 32.5*  MCV 95.0 96.2  PLT 218 536   Basic Metabolic Panel: Recent Labs  Lab 06/25/20 2303 06/26/20 0414  NA 136 137  K 4.4 4.9  CL 104 106  CO2 19* 19*  GLUCOSE 109* 83  BUN 41* 44*  CREATININE 1.60* 1.64*  CALCIUM 9.6 9.3   GFR: Estimated Creatinine Clearance: 17.3 mL/min (A) (by C-G formula based on SCr of 1.64 mg/dL (H)). Liver Function Tests: No results for input(s): AST, ALT, ALKPHOS, BILITOT, PROT, ALBUMIN in the last 168 hours. No results for input(s): LIPASE, AMYLASE in the last 168 hours. No results for input(s): AMMONIA in the last 168 hours. Coagulation Profile: No results for input(s): INR, PROTIME in the last 168 hours. Cardiac Enzymes: No results for input(s): CKTOTAL, CKMB, CKMBINDEX, TROPONINI in the last 168 hours. BNP (last 3 results) No results for input(s): PROBNP in the last 8760 hours. HbA1C: No results for input(s): HGBA1C in the last 72 hours. CBG: No results for input(s): GLUCAP in the last 168 hours. Lipid Profile: No results for input(s): CHOL, HDL, LDLCALC, TRIG, CHOLHDL, LDLDIRECT in the last 72 hours. Thyroid Function Tests: Recent Labs    06/26/20 0414  TSH 4.457   Anemia Panel: No results for input(s): VITAMINB12, FOLATE, FERRITIN, TIBC, IRON, RETICCTPCT in the last 72 hours. Sepsis Labs: No results for input(s): PROCALCITON, LATICACIDVEN in the last  168 hours.  Recent Results (from the past 240 hour(s))  Resp Panel by RT-PCR (Flu A&B, Covid) Nasopharyngeal Swab     Status: None   Collection Time: 06/25/20  7:15 PM   Specimen: Nasopharyngeal Swab; Nasopharyngeal(NP) swabs in vial transport medium  Result Value Ref Range Status   SARS Coronavirus 2 by RT PCR NEGATIVE NEGATIVE Final    Comment: (NOTE) SARS-CoV-2 target nucleic acids are NOT DETECTED.  The SARS-CoV-2 RNA is generally detectable in upper respiratory specimens during the acute phase of infection. The lowest concentration of SARS-CoV-2 viral copies this assay can detect is 138 copies/mL. A negative result does not preclude SARS-Cov-2 infection and should not be used as the sole basis  for treatment or other patient management decisions. A negative result may occur with  improper specimen collection/handling, submission of specimen other than nasopharyngeal swab, presence of viral mutation(s) within the areas targeted by this assay, and inadequate number of viral copies(<138 copies/mL). A negative result must be combined with clinical observations, patient history, and epidemiological information. The expected result is Negative.  Fact Sheet for Patients:  EntrepreneurPulse.com.au  Fact Sheet for Healthcare Providers:  IncredibleEmployment.be  This test is no t yet approved or cleared by the [place] FDA and  has been authorized for detection and/or diagnosis of SARS-CoV-2 by FDA under an Emergency Use Authorization (EUA). This EUA will remain  in effect (meaning this test can be used) for the duration of the COVID-19 declaration under Section 564(b)(1) of the Act, 21 U.S.C.section 360bbb-3(b)(1), unless the authorization is terminated  or revoked sooner.       Influenza A by PCR NEGATIVE NEGATIVE Final   Influenza B by PCR NEGATIVE NEGATIVE Final    Comment: (NOTE) The Xpert Xpress SARS-CoV-2/FLU/RSV plus assay is  intended as an aid in the diagnosis of influenza from Nasopharyngeal swab specimens and should not be used as a sole basis for treatment. Nasal washings and aspirates are unacceptable for Xpert Xpress SARS-CoV-2/FLU/RSV testing.  Fact Sheet for Patients: EntrepreneurPulse.com.au  Fact Sheet for Healthcare Providers: IncredibleEmployment.be  This test is not yet approved or cleared by the [place] FDA and has been authorized for detection and/or diagnosis of SARS-CoV-2 by FDA under an Emergency Use Authorization (EUA). This EUA will remain in effect (meaning this test can be used) for the duration of the COVID-19 declaration under Section 564(b)(1) of the Act, 21 U.S.C. section 360bbb-3(b)(1), unless the authorization is terminated or revoked.  Performed at [facility], [place] [address], [place], [place] [address]       Radiology Studies: DG Chest 2 View  Result Date: 06/25/2020 CLINICAL DATA:  Shortness of breath x3 days. EXAM: CHEST - 2 VIEW COMPARISON:  March 22, 2020 FINDINGS: Multiple sternal wires are seen. Mild to moderate severity areas of atelectasis and/or infiltrate are noted within the bilateral lung bases. Small bilateral pleural effusions are seen. No pneumothorax is identified. The cardiac silhouette is borderline in size. There is marked severity calcification of the thoracic aorta no acute osseous abnormalities are identified. IMPRESSION: 1. Mild to moderate severity bibasilar atelectasis and/or infiltrate. 2. Small bilateral pleural effusions. Electronically Signed   By: Virgina Norfolk M.D.   On: 06/25/2020 21:41    Scheduled Meds:  amLODipine  5 mg Oral Daily   aspirin  81 mg Oral Daily   carvedilol  6.25 mg Oral BID WC   ezetimibe  10 mg Oral QHS   ferrous sulfate  325 mg Oral QHS   heparin injection (subcutaneous)  5,000 Units Subcutaneous Q8H   levothyroxine  75 mcg Oral QHS   Continuous Infusions:   LOS: 1  day   Time spent: 32 minutes   Darliss Cheney, MD [facility]  06/26/2020, 10:27 AM   How to contact the [facility] Attending or Consulting provider [place] or covering provider during after hours [place], for this patient?  Check the care team in [facility] and look for a) attending/consulting [facility] provider listed and b) the [facility] team listed. Page or secure chat [phone]. Log into [email or web address] and use [place]'s universal password to access. If you do not have the password, please contact the hospital operator. Locate the [facility] provider you  are looking for under [facility] and page to a number that you can be directly reached. If you still have difficulty reaching the provider, please page the [facility] (Director on Call) for the Hospitalists listed on amion for assistance.

## 2020-06-26 NOTE — Plan of Care (Signed)
  Problem: Education: Goal: Knowledge of General Education information will improve Description: Including pain rating scale, medication(s)/side effects and non-pharmacologic comfort measures Outcome: Progressing   Problem: Health Behavior/Discharge Planning: Goal: Ability to manage health-related needs will improve Outcome: Progressing   Problem: Clinical Measurements: Goal: Ability to maintain clinical measurements within normal limits will improve Outcome: Progressing Goal: Will remain free from infection Outcome: Progressing Goal: Diagnostic test results will improve Outcome: Progressing Goal: Respiratory complications will improve Outcome: Progressing Goal: Cardiovascular complication will be avoided Outcome: Progressing   Problem: Activity: Goal: Risk for activity intolerance will decrease Outcome: Progressing   Problem: Nutrition: Goal: Adequate nutrition will be maintained Outcome: Progressing   Problem: Coping: Goal: Level of anxiety will decrease Outcome: Progressing   Problem: Elimination: Goal: Will not experience complications related to bowel motility Outcome: Progressing Goal: Will not experience complications related to urinary retention Outcome: Progressing   Problem: Pain Managment: Goal: General experience of comfort will improve Outcome: Progressing   Problem: Safety: Goal: Ability to remain free from injury will improve Outcome: Progressing   Problem: Skin Integrity: Goal: Risk for impaired skin integrity will decrease Outcome: Progressing   Problem: Skin Integrity: Goal: Risk for impaired skin integrity will decrease Outcome: Progressing   Problem: Education: Goal: Ability to demonstrate management of disease process will improve Outcome: Progressing Goal: Ability to verbalize understanding of medication therapies will improve Outcome: Progressing Goal: Individualized Educational Video(s) Outcome: Progressing   Problem:  Activity: Goal: Capacity to carry out activities will improve Outcome: Progressing   Problem: Cardiac: Goal: Ability to achieve and maintain adequate cardiopulmonary perfusion will improve Outcome: Progressing

## 2020-06-26 NOTE — Progress Notes (Signed)
Heart Failure Navigator Progress Note  Assessed for Heart & Vascular TOC clinic readiness.  Unfortunately at this time the patient does not meet criteria due to advanced CKD and severe aortic stenosis, not a candidate for TAVR.   Navigator available for reassessment of patient.   Kerby Nora, PharmD, BCPS Heart Failure Stewardship Pharmacist Phone [phone]

## 2020-06-26 NOTE — Evaluation (Signed)
Occupational Therapy Evaluation Patient Details Name: [name] MRN: [id number] DOB: 1931-11-15 Today's Date: 06/26/2020    History of Present Illness Patient is a 87 y/o female who was admitted on 06/26/20 with SOB, cough and weakness. Found to have acute respiratory failure secondary to acute on chronic CHF exacerbation and aortic stenosis. CXR- bilateral pleural effusions. BNP >4500. PMH includes CAD s/p CABG. HTN. severe aortic stenosis (not candidate for TAVR), left THA 03/2020.   Clinical Impression   Patient admitted for above and presenting with problem list below, including impaired activity tolerance, dyspnea on exertion, impaired balance and generalized weakness.  Pt on RA during session with VSS.  PTA reports independent with ADLs and light IADLs, today requires up to min assist for ADLs and min guard for functional mobility and transfers. Son reports hx of cognitive memory decline, but pt is at baseline.  Based on performance today, believe pt will benefit from continued OT services while admitted and after dc at [facility] level in order to optimize independence, safety with ADLs, mobility.     Follow Up Recommendations  Home health OT;Supervision/Assistance - 24 hour    Equipment Recommendations  3 in 1 bedside commode    Recommendations for Other Services       Precautions / Restrictions Precautions Precautions: Fall Restrictions Weight Bearing Restrictions: No      Mobility Bed Mobility Overal bed mobility: Needs Assistance Bed Mobility: Supine to Sit     Supine to sit: Supervision;HOB elevated     General bed mobility comments: OOB in recliner upon entry    Transfers Overall transfer level: Needs assistance Equipment used: None Transfers: Sit to/from Stand Sit to Stand: Min guard         General transfer comment: min guard from recliner and commode for safety and balance    Balance Overall balance assessment: Needs assistance;History of  Falls Sitting-balance support: Feet supported;No upper extremity supported Sitting balance-Leahy Scale: Good Sitting balance - Comments: supervision for safety.   Standing balance support: No upper extremity supported;During functional activity Standing balance-Leahy Scale: Poor Standing balance comment: relies on external support                           ADL either performed or assessed with clinical judgement   ADL Overall ADL's : Needs assistance/impaired     Grooming: Min guard;Standing           Upper Body Dressing : Sitting;Set up;Supervision/safety   Lower Body Dressing: Minimal assistance;Sit to/from stand   Toilet Transfer: Min guard;Ambulation;Grab bars   Toileting- Clothing Manipulation and Hygiene: Min guard;Sit to/from stand       Functional mobility during ADLs: Min guard;Cueing for safety General ADL Comments: pt limited by decreased activity tolerance, balance, and weakness     Vision   Vision Assessment?: No apparent visual deficits     Perception     Praxis      Pertinent Vitals/Pain Pain Assessment: No/denies pain     Hand Dominance Right   Extremity/Trunk Assessment Upper Extremity Assessment Upper Extremity Assessment: Generalized weakness   Lower Extremity Assessment Lower Extremity Assessment: Defer to PT evaluation       Communication Communication Communication: No difficulties   Cognition Arousal/Alertness: Awake/alert Behavior During Therapy: WFL for tasks assessed/performed Overall Cognitive Status: History of cognitive impairments - at baseline  General Comments: Per son present in room, pt at cognitive baseline. Hx of dementia. Follows commands well.   General Comments  son present in room. VSS on RA    Exercises     Shoulder Instructions      Home Living Family/patient expects to be discharged to:: Private residence Living Arrangements:  (son who is  disabled) Available Help at Discharge: Family;Available PRN/intermittently Type of Home: House Home Access: Stairs to enter [employer] of Steps: 2 Entrance Stairs-Rails: None Home Layout: Laundry or work area in basement     [employer] Shower/Tub: Teacher, early years/pre: Standard     Home Equipment: Environmental consultant - 2 wheels;Cane - single point;Grab bars - tub/shower          Prior Functioning/Environment Level of Independence: Independent with assistive device(s)        Comments: Uses SPC for short distances and RW for longer distances. Does ADLs. Cooks a little.        OT Problem List: Decreased strength;Decreased activity tolerance;Impaired balance (sitting and/or standing);Decreased cognition;Decreased safety awareness;Decreased knowledge of precautions;Decreased knowledge of use of DME or AE;Cardiopulmonary status limiting activity      OT Treatment/Interventions: Self-care/ADL training;DME and/or AE instruction;Therapeutic activities;Patient/family education;Balance training;Therapeutic exercise    OT Goals(Current goals can be found in the care plan section) Acute Rehab OT Goals Patient Stated Goal: to get better OT Goal Formulation: With patient Time For Goal Achievement: 07/10/20 Potential to Achieve Goals: Good  OT Frequency: Min 2X/week   Barriers to D/C:            Co-evaluation              AM-PAC OT "6 Clicks" Daily Activity     Outcome Measure Help from another person eating meals?: None Help from another person taking care of personal grooming?: A Little Help from another person toileting, which includes using toliet, bedpan, or urinal?: A Little Help from another person bathing (including washing, rinsing, drying)?: A Little Help from another person to put on and taking off regular upper body clothing?: A Little Help from another person to put on and taking off regular lower body clothing?: A Little 6 Click Score: 19   End  of Session Nurse Communication: Mobility status  Activity Tolerance: Patient tolerated treatment well Patient left: in chair;with call bell/phone within reach;with chair alarm set;with nursing/sitter in room;with family/visitor present  OT Visit Diagnosis: Other abnormalities of gait and mobility (R26.89);Muscle weakness (generalized) (M62.81)                Time: 2831-5176 OT Time Calculation (min): 16 min Charges:  OT General Charges $OT Visit: 1 Visit OT Evaluation $OT Eval Moderate Complexity: 1 Mod  Jolaine Artist, OT Acute Rehabilitation Services Pager [phone] Office [phone]   Delight Stare 06/26/2020, 1:53 PM

## 2020-06-26 NOTE — Progress Notes (Signed)
Mobility Specialist: Progress Note   06/26/20 1724  Mobility  Activity Ambulated in hall  Level of Assistance Minimal assist, patient does 75% or more  Assistive Device Front wheel walker  Distance Ambulated (ft) 70 ft  Mobility Ambulated with assistance in hallway  Mobility Response Tolerated well  Mobility performed by Mobility specialist  $Mobility charge 1 Mobility   Pre-Mobility: 67 HR, 123/74 BP, 99% SpO2 Post-Mobility: 72 HR, 133/69 BP, 99% SpO2  Pt to BR, successfully voided, and then agreeable to ambulate. Distance limited d/t BLE weakness, otherwise asx. Pt back to bed after walk per request and is sitting up to eat dinner.   [facility] Yusuf Yu Mobility Specialist Mobility Specialist Phone: [phone]

## 2020-06-26 NOTE — Evaluation (Signed)
Physical Therapy Evaluation Patient Details Name: [name] MRN: [id number] DOB: [birth year] Today's Date: 06/26/2020   History of Present Illness  Patient is a 87 y/o female who was admitted on 06/26/20 with SOB, cough and weakness. Found to have acute respiratory failure secondary to acute on chronic CHF exacerbation and aortic stenosis. CXR- bilateral pleural effusions. BNP >4500. PMH includes CAD s/p CABG. HTN. severe aortic stenosis (not candidate for TAVR), left THA 03/2020.  Clinical Impression  Patient presents with generalized weakness, dyspnea on exertion, decreased activity tolerance, and impaired mobility s/p above. Pt lives at home with disabled son and reports being Mod I for ADLs and uses SPC vs RW for ambulation. 1 fall ~6 months ago down the stairs resulting in LE fx. Today, pt tolerated bed mobility, transfers and gait training with Min A-Min guard and use of RW for support. Limited by LE weakness. 2/4 DOE noted with activity but VSS on RA with Sp02 >92%, but mostly in high 90s. Encouraged walking to bathroom with nursing and hallway ambulation with mobility tech/nursing as well to improve overall strength/mobility. Will follow acutely to maximize independence and mobility prior to return home.    Follow Up Recommendations Home health PT;Supervision - Intermittent    Equipment Recommendations  None recommended by PT    Recommendations for Other Services       Precautions / Restrictions Precautions Precautions: Fall Restrictions Weight Bearing Restrictions: No      Mobility  Bed Mobility Overal bed mobility: Needs Assistance Bed Mobility: Supine to Sit     Supine to sit: Supervision;HOB elevated     General bed mobility comments: Able to get to EOB without assist; use of rail for support. No dizziness.    Transfers Overall transfer level: Needs assistance Equipment used: Rolling walker (2 wheeled) Transfers: Sit to/from Stand Sit to Stand: Min guard;Min  assist         General transfer comment: Min A-Min guard to stand from EOB x2, cues for hand placement as pt pulling up on RW. Transferred to chair post ambulation.  Ambulation/Gait Ambulation/Gait assistance: Min guard Gait Distance (Feet): 50 Feet Assistive device: Rolling walker (2 wheeled) Gait Pattern/deviations: Step-through pattern;Decreased stride length;Trunk flexed Gait velocity: decreased Gait velocity interpretation: <1.31 ft/sec, indicative of household ambulator General Gait Details: Slow, unsteady gait with 2/4 DOE. Sp02 >92% on RA throughout. 1 standing rest break. "my legs feel like they might give out."  Stairs            Wheelchair Mobility    Modified Rankin (Stroke Patients Only)       Balance Overall balance assessment: Needs assistance;History of Falls Sitting-balance support: Feet supported;No upper extremity supported Sitting balance-Leahy Scale: Good Sitting balance - Comments: supervision for safety.   Standing balance support: During functional activity Standing balance-Leahy Scale: Poor Standing balance comment: Requires UE support in standing.,                             Pertinent Vitals/Pain Pain Assessment: No/denies pain    Home Living Family/patient expects to be discharged to:: Private residence Living Arrangements:  (son who is disabled) Available Help at Discharge: Family;Available PRN/intermittently Type of Home: House Home Access: Stairs to enter Entrance Stairs-Rails: None Entrance Stairs-Number of Steps: 2 Home Layout: Laundry or work area in [place]: Environmental consultant - 2 wheels;Cane - single point      Prior Function Level of Independence: Independent with assistive device(s)  Comments: Uses SPC for short distances and RW for longer distances. Does ADLs. Cooks a little.     Hand Dominance        Extremity/Trunk Assessment   Upper Extremity Assessment Upper Extremity Assessment:  Defer to OT evaluation    Lower Extremity Assessment Lower Extremity Assessment: Generalized weakness (but functional)       Communication   Communication: No difficulties  Cognition Arousal/Alertness: Awake/alert Behavior During Therapy: WFL for tasks assessed/performed Overall Cognitive Status: History of cognitive impairments - at baseline                                 General Comments: Per son present in room, pt at cognitive baseline. Hx of dementia. Follows commands well.      General Comments General comments (skin integrity, edema, etc.): Son present during session and assisted with providing PLOF/history. VSS on RA with Sp02 >92% with activity but mostly in high 90s.    Exercises     Assessment/Plan    PT Assessment Patient needs continued PT services  PT Problem List Decreased strength;Decreased mobility;Decreased activity tolerance;Cardiopulmonary status limiting activity;Decreased cognition       PT Treatment Interventions Therapeutic exercise;Balance training;Gait training;Patient/family education;Therapeutic activities;Functional mobility training;Stair training;DME instruction    PT Goals (Current goals can be found in the Care Plan section)  Acute Rehab PT Goals Patient Stated Goal: to get better PT Goal Formulation: With patient/family Time For Goal Achievement: 07/10/20 Potential to Achieve Goals: Good    Frequency Min 3X/week   Barriers to discharge        Co-evaluation               AM-PAC PT "6 Clicks" Mobility  Outcome Measure Help needed turning from your back to your side while in a flat bed without using bedrails?: A Little Help needed moving from lying on your back to sitting on the side of a flat bed without using bedrails?: A Little Help needed moving to and from a bed to a chair (including a wheelchair)?: A Little Help needed standing up from a chair using your arms (e.g., wheelchair or bedside chair)?: A  Little Help needed to walk in hospital room?: A Little Help needed climbing 3-5 steps with a railing? : A Little 6 Click Score: 18    End of Session Equipment Utilized During Treatment: Gait belt Activity Tolerance: Patient limited by fatigue Patient left: in chair;with call bell/phone within reach;with chair alarm set;with family/visitor present Nurse Communication: Mobility status PT Visit Diagnosis: Muscle weakness (generalized) (M62.81);Difficulty in walking, not elsewhere classified (R26.2)    Time: 8676-7209 PT Time Calculation (min) (ACUTE ONLY): 27 min   Charges:   PT Evaluation $PT Eval Moderate Complexity: 1 Mod PT Treatments $Gait Training: 8-22 mins        Marisa Severin, PT, DPT Acute Rehabilitation Services Pager [phone] Office [place] 06/26/2020, 12:35 PM

## 2020-06-26 NOTE — Plan of Care (Signed)

## 2020-06-27 LAB — BASIC METABOLIC PANEL
Anion gap: 10 (ref 5–15)
BUN: 56 mg/dL — ABNORMAL HIGH (ref 8–23)
CO2: 22 mmol/L (ref 22–32)
Calcium: 8.6 mg/dL — ABNORMAL LOW (ref 8.9–10.3)
Chloride: 103 mmol/L (ref 98–111)
Creatinine, Ser: 1.77 mg/dL — ABNORMAL HIGH (ref 0.44–1.00)
GFR, Estimated: 28 mL/min — ABNORMAL LOW (ref >60)
Glucose, Bld: 87 mg/dL (ref 70–99)
Potassium: 3.4 mmol/L — ABNORMAL LOW (ref 3.5–5.1)
Sodium: 135 mmol/L (ref 135–145)

## 2020-06-27 LAB — BRAIN NATRIURETIC PEPTIDE: B Natriuretic Peptide: >4500 pg/mL — ABNORMAL HIGH (ref 0.0–100.0)

## 2020-06-27 NOTE — Discharge Summary (Signed)
Physician Discharge Summary  [name] GYK:[id number] DOB: 09/19/1931 DOA: 06/25/2020  PCP: Lajean Manes, MD  Admit date: 06/25/2020 Discharge date: 06/27/2020 30 Day Unplanned Readmission Risk Score    Flowsheet Row ED to Hosp-Admission (Current) from 06/25/2020 in [facility] 4E CV SURGICAL PROGRESSIVE CARE  30 Day Unplanned Readmission Risk Score (%) 18.76 Filed at 06/27/2020 0800       This score is the patient's risk of an unplanned readmission within 30 days of being discharged (0 -100%). The score is based on dignosis, age, lab data, medications, orders, and past utilization.   Low:  0-14.9   Medium: 15-21.9   High: 22-29.9   Extreme: 30 and above           Admitted From: Home Disposition: Home  Recommendations for Outpatient Follow-up:  Follow up with PCP in 1-2 weeks Follow-up with primary cardiologist in 2 weeks Please obtain BMP/CBC in one week Please follow up with your PCP on the following pending results: Unresulted Labs (From admission, onward)    None         Home Health: Yes Equipment/Devices: 3 in 1 commode  Discharge Condition: Stable CODE STATUS: Full code Diet recommendation: Low-sodium  Subjective: Seen and examined.  No complaints.  No shortness of breath.  Wants to go home.  HPI: [name] is a 87 y.o. female with history of CAD status post CABG severe aortic stenosis not a candidate for TAVR, chronic kidney disease stage IV, anemia, hypertension presents to the ER because of worsening shortness of breath.  Patient states he has been short of breath mostly on exertion for the last few days.  Denies chest pain productive cough fever or chills.   ED Course: In the ER patient was found to be short of breath with chest x-ray showing bilateral pleural effusion BNP was more than 4500 creatinine is around 1.6 COVID test negative hemoglobin 10.9.  High sensitive troponins are 74 and 82.  Patient was given IV Lasix and admitted for acute  CHF.  Brief/Interim Summary: Patient was admitted to hospital service with acute on chronic diastolic congestive heart failure based off of the fact that her BNP was> 4500.  Although her chest x-ray was negative for acute pulmonary edema and she did not have any lower extremity edema.  She received 1 dose of IV Lasix in the ED.  Within 24 hours, she improved.  She was given another dose of Lasix.  It is not fully understood whether her symptoms were secondary to congestive heart failure or perhaps due to her severe aortic stenosis for which she has been deemed not a surgical candidate by her cardiologist.  Nonetheless, patient has improved.  Patient also required 2 L of oxygen during some part of the hospitalization and now she is off of oxygen.  Lungs clear to auscultation.  She is going to be discharged in stable condition.  I have also discussed plan of discharge with her son over the phone.  He was thankful.  Discharge Diagnoses:  Principal Problem:   Acute on chronic diastolic (congestive) heart failure (HCC) Active Problems:   Coronary atherosclerosis of native coronary artery   Essential hypertension, benign   Aortic stenosis   Acute CHF (congestive heart failure) ([place])    Discharge Instructions   Allergies as of 06/27/2020       Reactions   Aleve [naproxen Sodium] Hives   Aleve [naproxen]    Other reaction(s): Unknown   Crestor [rosuvastatin]    Other  reaction(s): lethargy   Excedrin Extra Strength [aspirin-acetaminophen-caffeine] Nausea And Vomiting   Meloxicam    Other reaction(s): felt poorly   Statins Other (See Comments)   No energy, very weak   Vytorin [ezetimibe-simvastatin] Other (See Comments)   Fatigue        Medication List     STOP taking these medications    docusate sodium 100 MG capsule Commonly known as: COLACE       TAKE these medications    amLODipine 5 MG tablet Commonly known as: NORVASC Take 5 mg by mouth daily.   aspirin 81 MG  chewable tablet Chew 81 mg by mouth daily.   B-12 PO Take 1 tablet by mouth daily.   calcium gluconate 500 MG tablet Take 1 tablet by mouth at bedtime.   carvedilol 6.25 MG tablet Commonly known as: COREG Take 1 tablet (6.25 mg total) by mouth 2 (two) times daily with a meal.   ezetimibe 10 MG tablet Commonly known as: ZETIA Take 10 mg by mouth at bedtime.   ferrous sulfate 325 (65 FE) MG tablet Take 325 mg by mouth at bedtime.   HYDROcodone-acetaminophen 10-325 MG tablet Commonly known as: NORCO Take 0.5-1 tablets by mouth every 4 (four) hours as needed for moderate pain.   levothyroxine 75 MCG tablet Commonly known as: SYNTHROID Take 75 mcg by mouth at bedtime.   multivitamin with minerals Tabs tablet Take 1 tablet by mouth daily.   Vitamin D (Ergocalciferol) 1.25 MG (50000 UNIT) Caps capsule Commonly known as: DRISDOL Take 50,000 Units by mouth every Monday.               Durable Medical Equipment  (From admission, onward)           Start     Ordered   06/27/20 0752  For home use only DME Bedside commode  Once       Question:  Patient needs a bedside commode to treat with the following condition  Answer:  Balance problem   06/27/20 0753            Follow-up Information     Stoneking, Hal, MD Follow up in 1 week(s).   Specialty: Internal Medicine Contact information: [address] [place] [address] [phone]         [name], MD .   Specialty: Cardiology Contact information: [phone]  [place] [address] [phone]                Allergies  Allergen Reactions   Aleve [Naproxen Sodium] Hives   Aleve [Naproxen]     Other reaction(s): Unknown   Crestor [Rosuvastatin]     Other reaction(s): lethargy   Excedrin Extra Strength [Aspirin-Acetaminophen-Caffeine] Nausea And Vomiting   Meloxicam     Other reaction(s): felt poorly   Statins Other (See Comments)    No energy,  very weak   Vytorin [Ezetimibe-Simvastatin] Other (See Comments)    Fatigue     Consultations: None   Procedures/Studies: DG Chest 2 View  Result Date: 06/25/2020 CLINICAL DATA:  Shortness of breath x3 days. EXAM: CHEST - 2 VIEW COMPARISON:  March 22, 2020 FINDINGS: Multiple sternal wires are seen. Mild to moderate severity areas of atelectasis and/or infiltrate are noted within the bilateral lung bases. Small bilateral pleural effusions are seen. No pneumothorax is identified. The cardiac silhouette is borderline in size. There is marked severity calcification of the thoracic aorta no acute osseous abnormalities are  identified. IMPRESSION: 1. Mild to moderate severity bibasilar atelectasis and/or infiltrate. 2. Small bilateral pleural effusions. Electronically Signed   By: Virgina Norfolk M.D.   On: 06/25/2020 21:41     Discharge Exam: Vitals:   06/27/20 0424 06/27/20 0842  BP: 106/68 115/68  Pulse: 72 (!) 55  Resp: 18 18  Temp: (!) 97.3 F (36.3 C) 97.6 F (36.4 C)  SpO2: 99% 100%   Vitals:   06/26/20 2331 06/27/20 0424 06/27/20 0500 06/27/20 0842  BP: 105/73 106/68  115/68  Pulse: 65 72  (!) 55  Resp: 20 18  18   Temp: 98 F (36.7 C) (!) 97.3 F (36.3 C)  97.6 F (36.4 C)  TempSrc: Oral Oral  Oral  SpO2: 99% 99%  100%  Weight:   41.3 kg   Height:        General: Pt is alert, awake, not in acute distress Cardiovascular: RRR, S1/S2 +, no rubs, no gallops, 4/6 systolic murmur Respiratory: CTA bilaterally, no wheezing, no rhonchi Abdominal: Soft, NT, ND, bowel sounds + Extremities: no edema, no cyanosis    The results of significant diagnostics from this hospitalization (including imaging, microbiology, ancillary and laboratory) are listed below for reference.     Microbiology: Recent Results (from the past 240 hour(s))  Resp Panel by RT-PCR (Flu A&B, Covid) Nasopharyngeal Swab     Status: None   Collection Time: 06/25/20  7:15 PM   Specimen: Nasopharyngeal Swab;  Nasopharyngeal(NP) swabs in vial transport medium  Result Value Ref Range Status   SARS Coronavirus 2 by RT PCR NEGATIVE NEGATIVE Final    Comment: (NOTE) SARS-CoV-2 target nucleic acids are NOT DETECTED.  The SARS-CoV-2 RNA is generally detectable in upper respiratory specimens during the acute phase of infection. The lowest concentration of SARS-CoV-2 viral copies this assay can detect is 138 copies/mL. A negative result does not preclude SARS-Cov-2 infection and should not be used as the sole basis for treatment or other patient management decisions. A negative result may occur with  improper specimen collection/handling, submission of specimen other than nasopharyngeal swab, presence of viral mutation(s) within the areas targeted by this assay, and inadequate number of viral copies(<138 copies/mL). A negative result must be combined with clinical observations, patient history, and epidemiological information. The expected result is Negative.  Fact Sheet for Patients:  EntrepreneurPulse.com.au  Fact Sheet for Healthcare Providers:  IncredibleEmployment.be  This test is no t yet approved or cleared by the [place] FDA and  has been authorized for detection and/or diagnosis of SARS-CoV-2 by FDA under an Emergency Use Authorization (EUA). This EUA will remain  in effect (meaning this test can be used) for the duration of the COVID-19 declaration under Section 564(b)(1) of the Act, 21 U.S.C.section 360bbb-3(b)(1), unless the authorization is terminated  or revoked sooner.       Influenza A by PCR NEGATIVE NEGATIVE Final   Influenza B by PCR NEGATIVE NEGATIVE Final    Comment: (NOTE) The Xpert Xpress SARS-CoV-2/FLU/RSV plus assay is intended as an aid in the diagnosis of influenza from Nasopharyngeal swab specimens and should not be used as a sole basis for treatment. Nasal washings and aspirates are unacceptable for Xpert Xpress  SARS-CoV-2/FLU/RSV testing.  Fact Sheet for Patients: EntrepreneurPulse.com.au  Fact Sheet for Healthcare Providers: IncredibleEmployment.be  This test is not yet approved or cleared by the [place] FDA and has been authorized for detection and/or diagnosis of SARS-CoV-2 by FDA under an Emergency Use Authorization (EUA). This EUA will remain  in effect (meaning this test can be used) for the duration of the COVID-19 declaration under Section 564(b)(1) of the Act, 21 U.S.C. section 360bbb-3(b)(1), unless the authorization is terminated or revoked.  Performed at [facility], [place] [address] [place], [place] [address]      Labs: BNP (last 3 results) Recent Labs    06/25/20 1914 06/27/20 0047  BNP >4,500.0* >9,629.5*   Basic Metabolic Panel: Recent Labs  Lab 06/25/20 2303 06/26/20 0414 06/27/20 0047  NA 136 137 135  K 4.4 4.9 3.4*  CL 104 106 103  CO2 19* 19* 22  GLUCOSE 109* 83 87  BUN 41* 44* 56*  CREATININE 1.60* 1.64* 1.77*  CALCIUM 9.6 9.3 8.6*   Liver Function Tests: No results for input(s): AST, ALT, ALKPHOS, BILITOT, PROT, ALBUMIN in the last 168 hours. No results for input(s): LIPASE, AMYLASE in the last 168 hours. No results for input(s): AMMONIA in the last 168 hours. CBC: Recent Labs  Lab 06/25/20 1914 06/26/20 0414  WBC 10.5 8.5  NEUTROABS 8.7*  --   HGB 10.9* 10.3*  HCT 33.9* 32.5*  MCV 95.0 96.2  PLT 218 190   Cardiac Enzymes: No results for input(s): CKTOTAL, CKMB, CKMBINDEX, TROPONINI in the last 168 hours. BNP: Invalid input(s): POCBNP CBG: No results for input(s): GLUCAP in the last 168 hours. D-Dimer No results for input(s): DDIMER in the last 72 hours. Hgb A1c No results for input(s): HGBA1C in the last 72 hours. Lipid Profile No results for input(s): CHOL, HDL, LDLCALC, TRIG, CHOLHDL, LDLDIRECT in the last 72 hours. Thyroid function studies Recent Labs    06/26/20 0414  TSH  4.457   Anemia work up No results for input(s): VITAMINB12, FOLATE, FERRITIN, TIBC, IRON, RETICCTPCT in the last 72 hours. Urinalysis No results found for: COLORURINE, APPEARANCEUR, [place], [place], GLUCOSEU, [place], [place], [place], PROTEINUR, UROBILINOGEN, NITRITE, LEUKOCYTESUR Sepsis Labs Invalid input(s): PROCALCITONIN,  WBC,  LACTICIDVEN Microbiology Recent Results (from the past 240 hour(s))  Resp Panel by RT-PCR (Flu A&B, Covid) Nasopharyngeal Swab     Status: None   Collection Time: 06/25/20  7:15 PM   Specimen: Nasopharyngeal Swab; Nasopharyngeal(NP) swabs in vial transport medium  Result Value Ref Range Status   SARS Coronavirus 2 by RT PCR NEGATIVE NEGATIVE Final    Comment: (NOTE) SARS-CoV-2 target nucleic acids are NOT DETECTED.  The SARS-CoV-2 RNA is generally detectable in upper respiratory specimens during the acute phase of infection. The lowest concentration of SARS-CoV-2 viral copies this assay can detect is 138 copies/mL. A negative result does not preclude SARS-Cov-2 infection and should not be used as the sole basis for treatment or other patient management decisions. A negative result may occur with  improper specimen collection/handling, submission of specimen other than nasopharyngeal swab, presence of viral mutation(s) within the areas targeted by this assay, and inadequate number of viral copies(<138 copies/mL). A negative result must be combined with clinical observations, patient history, and epidemiological information. The expected result is Negative.  Fact Sheet for Patients:  EntrepreneurPulse.com.au  Fact Sheet for Healthcare Providers:  IncredibleEmployment.be  This test is no t yet approved or cleared by the [place] FDA and  has been authorized for detection and/or diagnosis of SARS-CoV-2 by FDA under an Emergency Use Authorization (EUA). This EUA will remain  in effect (meaning this test can  be used) for the duration of the COVID-19 declaration under Section 564(b)(1) of the Act, 21 U.S.C.section 360bbb-3(b)(1), unless the authorization is terminated  or revoked  sooner.       Influenza A by PCR NEGATIVE NEGATIVE Final   Influenza B by PCR NEGATIVE NEGATIVE Final    Comment: (NOTE) The Xpert Xpress SARS-CoV-2/FLU/RSV plus assay is intended as an aid in the diagnosis of influenza from Nasopharyngeal swab specimens and should not be used as a sole basis for treatment. Nasal washings and aspirates are unacceptable for Xpert Xpress SARS-CoV-2/FLU/RSV testing.  Fact Sheet for Patients: EntrepreneurPulse.com.au  Fact Sheet for Healthcare Providers: IncredibleEmployment.be  This test is not yet approved or cleared by the [place] FDA and has been authorized for detection and/or diagnosis of SARS-CoV-2 by FDA under an Emergency Use Authorization (EUA). This EUA will remain in effect (meaning this test can be used) for the duration of the COVID-19 declaration under Section 564(b)(1) of the Act, 21 U.S.C. section 360bbb-3(b)(1), unless the authorization is terminated or revoked.  Performed at [facility], [place] [address], [place], [place] [address]      Time coordinating discharge: Over 30 minutes  SIGNED:   Darliss Cheney, MD  [facility] 06/27/2020, 9:42 AM  If 7PM-7AM, please contact night-coverage [email or web address]

## 2020-06-27 NOTE — Plan of Care (Signed)
  Problem: Education: Goal: Knowledge of General Education information will improve Description: Including pain rating scale, medication(s)/side effects and non-pharmacologic comfort measures Outcome: Progressing   Problem: Health Behavior/Discharge Planning: Goal: Ability to manage health-related needs will improve Outcome: Progressing   Problem: Clinical Measurements: Goal: Ability to maintain clinical measurements within normal limits will improve Outcome: Progressing Goal: Will remain free from infection Outcome: Progressing Goal: Diagnostic test results will improve Outcome: Progressing Goal: Respiratory complications will improve Outcome: Progressing Goal: Cardiovascular complication will be avoided Outcome: Progressing   Problem: Activity: Goal: Risk for activity intolerance will decrease Outcome: Progressing   Problem: Nutrition: Goal: Adequate nutrition will be maintained Outcome: Progressing   Problem: Coping: Goal: Level of anxiety will decrease Outcome: Progressing   Problem: Coping: Goal: Level of anxiety will decrease Outcome: Progressing   Problem: Elimination: Goal: Will not experience complications related to bowel motility Outcome: Progressing Goal: Will not experience complications related to urinary retention Outcome: Progressing   Problem: Pain Managment: Goal: General experience of comfort will improve Outcome: Progressing   Problem: Safety: Goal: Ability to remain free from injury will improve Outcome: Progressing   Problem: Skin Integrity: Goal: Risk for impaired skin integrity will decrease Outcome: Progressing   Problem: Education: Goal: Ability to demonstrate management of disease process will improve Outcome: Progressing Goal: Ability to verbalize understanding of medication therapies will improve Outcome: Progressing Goal: Individualized Educational Video(s) Outcome: Progressing   Problem: Activity: Goal: Capacity to carry  out activities will improve Outcome: Progressing

## 2020-06-27 NOTE — Plan of Care (Signed)

## 2020-06-27 NOTE — Progress Notes (Signed)
Patient d/c from 4E. Discharge instructions and education of medication provided to son.

## 2020-06-27 NOTE — Progress Notes (Signed)
Mobility Specialist: Progress Note   06/27/20 1236  Mobility  Activity Ambulated in hall  Level of Assistance Contact guard assist, steadying assist  Assistive Device Four wheel walker  Distance Ambulated (ft) 150 ft (75'x2)  Mobility Ambulated with assistance in hallway  Mobility Response Tolerated well  Mobility performed by Mobility specialist  $Mobility charge 1 Mobility   Pre-Mobility: 59 HR, 103/71 BP, 96% SpO2 Post-Mobility: 55 HR, 113/68 BP, 99% SpO2  Pt c/o BLE weakness during ambulation, otherwise asx. Pt required one seated break d/t LE weakness lasting 1-2 minutes. Pt back to bed after walk with call bell at her side and bed alarm on.   [facility] [name] Mobility Specialist Mobility Specialist Phone: [phone]

## 2020-06-27 NOTE — Plan of Care (Signed)
  Problem: Education: Goal: Knowledge of General Education information will improve Description: Including pain rating scale, medication(s)/side effects and non-pharmacologic comfort measures 06/27/2020 0603 by Florestine Avers, RN Outcome: Progressing 06/27/2020 0557 by Florestine Avers, RN Outcome: Progressing   Problem: Health Behavior/Discharge Planning: Goal: Ability to manage health-related needs will improve 06/27/2020 0603 by Florestine Avers, RN Outcome: Progressing 06/27/2020 0557 by Florestine Avers, RN Outcome: Progressing   Problem: Clinical Measurements: Goal: Ability to maintain clinical measurements within normal limits will improve 06/27/2020 0603 by Florestine Avers, RN Outcome: Progressing 06/27/2020 0557 by Florestine Avers, RN Outcome: Progressing Goal: Will remain free from infection 06/27/2020 0603 by Florestine Avers, RN Outcome: Progressing 06/27/2020 0557 by Florestine Avers, RN Outcome: Progressing Goal: Diagnostic test results will improve 06/27/2020 0603 by Florestine Avers, RN Outcome: Progressing 06/27/2020 0557 by Florestine Avers, RN Outcome: Progressing Goal: Respiratory complications will improve 06/27/2020 0603 by Florestine Avers, RN Outcome: Progressing 06/27/2020 0557 by Florestine Avers, RN Outcome: Progressing Goal: Cardiovascular complication will be avoided 06/27/2020 0603 by Florestine Avers, RN Outcome: Progressing 06/27/2020 0557 by Florestine Avers, RN Outcome: Progressing   Problem: Activity: Goal: Risk for activity intolerance will decrease 06/27/2020 0603 by Florestine Avers, RN Outcome: Progressing 06/27/2020 0557 by Florestine Avers, RN Outcome: Progressing   Problem: Nutrition: Goal: Adequate nutrition will be maintained 06/27/2020 0603 by Florestine Avers, RN Outcome: Progressing 06/27/2020 0557 by Florestine Avers, RN Outcome: Progressing   Problem: Coping: Goal: Level  of anxiety will decrease 06/27/2020 0603 by Florestine Avers, RN Outcome: Progressing 06/27/2020 0557 by Florestine Avers, RN Outcome: Progressing   Problem: Elimination: Goal: Will not experience complications related to bowel motility 06/27/2020 0603 by Florestine Avers, RN Outcome: Progressing 06/27/2020 0557 by Florestine Avers, RN Outcome: Progressing Goal: Will not experience complications related to urinary retention 06/27/2020 0603 by Florestine Avers, RN Outcome: Progressing 06/27/2020 0557 by Florestine Avers, RN Outcome: Progressing   Problem: Safety: Goal: Ability to remain free from injury will improve 06/27/2020 0603 by Florestine Avers, RN Outcome: Progressing 06/27/2020 0557 by Florestine Avers, RN Outcome: Progressing

## 2020-07-06 DIAGNOSIS — Z7189 Other specified counseling: Secondary | ICD-10-CM | POA: Diagnosis not present

## 2020-07-06 DIAGNOSIS — E559 Vitamin D deficiency, unspecified: Secondary | ICD-10-CM | POA: Diagnosis not present

## 2020-07-06 DIAGNOSIS — E039 Hypothyroidism, unspecified: Secondary | ICD-10-CM | POA: Diagnosis not present

## 2020-07-06 DIAGNOSIS — I5033 Acute on chronic diastolic (congestive) heart failure: Secondary | ICD-10-CM | POA: Diagnosis not present

## 2020-07-06 DIAGNOSIS — E7800 Pure hypercholesterolemia, unspecified: Secondary | ICD-10-CM | POA: Diagnosis not present

## 2020-07-06 DIAGNOSIS — N1832 Chronic kidney disease, stage 3b: Secondary | ICD-10-CM | POA: Diagnosis not present

## 2020-07-06 DIAGNOSIS — I5030 Unspecified diastolic (congestive) heart failure: Secondary | ICD-10-CM | POA: Diagnosis not present

## 2020-07-06 DIAGNOSIS — Z79899 Other long term (current) drug therapy: Secondary | ICD-10-CM | POA: Diagnosis not present

## 2020-08-10 NOTE — Progress Notes (Signed)
Evaluation Performed:  Follow-up visit  Date:  08/14/2020   ID:  [name], DOB July 16, 1931, MRN [id number]  Provider Location: Office  PCP:  Lajean Manes, MD  Cardiologist:  Jenkins Rouge, MD   Electrophysiologist:  None   Chief Complaint:  CAD/AS  History of Present Illness:    [name] is an 87 y.o. female with a history of  ASCAD s/p CABG, HTN, normal LVF, dyslipidemia and mild AS     CABG 2004 with LIMA to LAD SVG to Diagonal SVG OM and SVG PDA   No palpitations syncope or chest pain   I took care of her 2nd husband [name] Sister [name] is a patient of mine as well   Last echo 11/28/19 EF normal mean gradient 68 peak 69 mmHg DVI o.13 AVA 0.51   She has advanced dementia and is not a TAVR candidate She has 3 boys one Claiborne [name] lives with her and one lives close with her sister [name] They need to have family conference to consider assisted living for her   Admitted with CHF 06/27/20 BNP 4500 R/O Rx with iv lasix  Was not sent home on diuretic   Been fine since d/c not needing Lasix  She is a DNR confirmed with son with her today    Past Medical History:  Diagnosis Date   Anemia    Aortic stenosis    mild AS by 02/18/11  [name])   Arthritis    Bradycardia    Asymtomatic resolved   Cancer ([place])    ceervical   Chronic kidney disease    renal insufficency, stage III   Chronic renal disease, stage III ([name])    Coronary artery disease    CABG 2003   Dyslipidemia    goal LSL less then 70 but statin intolerant   GERD (gastroesophageal reflux disease)    Hypertension    Hypothyroidism    Iron deficiency anemia 7/12   Mild AI (aortic insufficiency)    Mild aortic stenosis    OP (osteoporosis) 10/2010   fosamax   Statin intolerance    Past Surgical History:  Procedure Laterality Date   ABDOMINAL HYSTERECTOMY     CARDIAC CATHETERIZATION     2004   cataracts     COLONOSCOPY  09/2010   normal EGD abd COLONOSCOPY   CORONARY ARTERY BYPASS  GRAFT  2004   with LIMA to LAD, SVG to diagonal, SVG to OM, SOVG to PDA normal LVF   HIP ARTHROPLASTY Left 03/23/2020   Procedure: ARTHROPLASTY BIPOLAR HIP (HEMIARTHROPLASTY) anterior approach;  Surgeon: Rod Can, MD;  Location: [facility];  Service: Orthopedics;  Laterality: Left;   LUMBAR LAMINECTOMY  09/23/2011   Procedure: MICRODISCECTOMY LUMBAR LAMINECTOMY;  Surgeon: Marybelle Killings, MD;  Location: [place];  Service: Orthopedics;  Laterality: N/A;  L4-5 Decompression     Current Meds  Medication Sig   amLODipine (NORVASC) 5 MG tablet Take 5 mg by mouth daily.   aspirin 81 MG chewable tablet Chew 81 mg by mouth daily.   carvedilol (COREG) 6.25 MG tablet Take 1 tablet (6.25 mg total) by mouth 2 (two) times daily with a meal.   Cyanocobalamin (B-12 PO) Take 1 tablet by mouth daily.   ezetimibe (ZETIA) 10 MG tablet Take 10 mg by mouth at bedtime.    ferrous sulfate 325 (65 FE) MG tablet Take 325 mg by mouth at bedtime.   HYDROcodone-acetaminophen (NORCO) 10-325 MG tablet Take 0.5-1 tablets by  mouth every 4 (four) hours as needed for moderate pain.   levothyroxine (SYNTHROID, LEVOTHROID) 75 MCG tablet Take 75 mcg by mouth at bedtime.   Multiple Vitamin (MULTIVITAMIN WITH MINERALS) TABS tablet Take 1 tablet by mouth daily.   Vitamin D, Ergocalciferol, (DRISDOL) 50000 UNITS CAPS Take 50,000 Units by mouth every Monday.      Allergies:   Aleve [naproxen sodium], Aleve [naproxen], Crestor [rosuvastatin], Excedrin extra strength [aspirin-acetaminophen-caffeine], Meloxicam, Statins, and Vytorin [ezetimibe-simvastatin]   Social History   Tobacco Use   Smoking status: Never   Smokeless tobacco: Never  Vaping Use   Vaping Use: Never used  Substance Use Topics   Alcohol use: No   Drug use: No     Family Hx: The patient's family history includes Asthma in her father; Heart Problems in her mother.  ROS:   Please see the history of present illness.     All other systems reviewed and are  negative.   Prior CV studies:   The following studies were reviewed today: Echo 01/30/17  Echo 10/04/18  Echo 04/30/19   Labs/Other Tests and Data Reviewed:    EKG:   04/07/16 SR rate 58 normal SR read as flutter but artifact rate 70 normal   Recent Labs: 06/26/2020: Hemoglobin 10.3; Platelets 190; TSH 4.457 06/27/2020: B Natriuretic Peptide >4,500.0; BUN 56; Creatinine, Ser 1.77; Potassium 3.4; Sodium 135   Recent Lipid Panel Lab Results  Component Value Date/Time   CHOL 208 (H) 03/24/2014 10:08 AM   TRIG 129.0 03/24/2014 10:08 AM   HDL 44.80 03/24/2014 10:08 AM   CHOLHDL 5 03/24/2014 10:08 AM   LDLCALC 137 (H) 03/24/2014 10:08 AM    Wt Readings from Last 3 Encounters:  08/14/20 42.6 kg  06/27/20 41.3 kg  03/22/20 51.3 kg     Objective:    Vital Signs:  BP 102/72   Pulse 64   Ht 5' (1.524 m)   Wt 42.6 kg   SpO2 97%   BMI 18.36 kg/m   Affect appropriate Healthy:  appears stated age 6: normal Neck supple with no adenopathy JVP normal left  bruits no thyromegaly Lungs clear with no wheezing and good diaphragmatic motion Heart:  S1/S2 AS  murmur, no rub, gallop or click PMI normal Abdomen: benighn, BS positve, no tenderness, no AAA no bruit.  No HSM or HJR Distal pulses intact with no bruits No edema Neuro non-focal Skin warm and dry Right TKR    ASSESSMENT & PLAN:    AS:  Severe mean gradient  53 mmHg by TTE 11/28/19  given age and advanced dementia not a candidate for TAVR will prescribe Lasix 20 mg PO PRN for dyspnea, weight gain or edema  CAD/CABG:  2004 no chest pain observe given age and lack of symptoms HLD:  On zetia labs with primary Thyroid:  On replacement labs with primary   COVID-19 Education: The signs and symptoms of COVID-19 were discussed with the patient and how to seek care for testing (follow up with PCP or arrange E-visit).  The importance of social distancing was discussed today.  Time:   Today, I have spent 30 minutes with the  patient     Medication Adjustments/Labs and Tests Ordered: Current medicines are reviewed at length with the patient today.  Concerns regarding medicines are outlined above.  Tests Ordered:  None   Medication Changes:  Lasix 20 mg PO PRN   Disposition:  Follow up in a year   Signed, Jenkins Rouge, MD  08/14/2020 9:35 AM    [facility]

## 2020-08-14 ENCOUNTER — Encounter: Payer: Self-pay | Admitting: Cardiovascular Disease

## 2020-08-14 ENCOUNTER — Ambulatory Visit: Payer: Medicare Other | Admitting: Cardiovascular Disease

## 2020-08-14 ENCOUNTER — Other Ambulatory Visit: Payer: Self-pay

## 2020-08-14 VITALS — BP 102/72 | HR 64 | Ht 60.0 in | Wt 94.0 lb

## 2020-08-14 DIAGNOSIS — F0150 Vascular dementia without behavioral disturbance: Secondary | ICD-10-CM

## 2020-08-14 DIAGNOSIS — Z951 Presence of aortocoronary bypass graft: Secondary | ICD-10-CM | POA: Diagnosis not present

## 2020-08-14 DIAGNOSIS — I350 Nonrheumatic aortic (valve) stenosis: Secondary | ICD-10-CM

## 2020-08-14 NOTE — Patient Instructions (Signed)
Medication Instructions:  *If you need a refill on your cardiac medications before your next appointment, please call your pharmacy*   Lab Work: If you have labs (blood work) drawn today and your tests are completely normal, you will receive your results only by: MyChart Message (if you have MyChart) OR A paper copy in the mail If you have any lab test that is abnormal or we need to change your treatment, we will call you to review the results.  Follow-Up: At [facility], you and your health needs are our priority.  As part of our continuing mission to provide you with exceptional heart care, we have created designated Provider Care Teams.  These Care Teams include your primary Cardiologist (physician) and Advanced Practice Providers (APPs -  Physician Assistants and Nurse Practitioners) who all work together to provide you with the care you need, when you need it.  We recommend signing up for the patient portal called "MyChart".  Sign up information is provided on this After Visit Summary.  MyChart is used to connect with patients for Virtual Visits (Telemedicine).  Patients are able to view lab/test results, encounter notes, upcoming appointments, etc.  Non-urgent messages can be sent to your provider as well.   To learn more about what you can do with MyChart, go to [email or web address].    Your next appointment:   6 month(s)  The format for your next appointment:   In Person  Provider:   You may see Peter Nishan, MD or one of the following Advanced Practice Providers on your designated Care Team:   Laura Ingold, NP  

## 2020-12-20 ENCOUNTER — Other Ambulatory Visit: Payer: Self-pay

## 2020-12-20 ENCOUNTER — Encounter (HOSPITAL_COMMUNITY): Payer: Self-pay | Admitting: Emergency Medicine

## 2020-12-20 ENCOUNTER — Emergency Department (HOSPITAL_COMMUNITY): Payer: Medicare Other

## 2020-12-20 ENCOUNTER — Inpatient Hospital Stay (HOSPITAL_COMMUNITY)
Admission: EM | Admit: 2020-12-20 | Discharge: 2020-12-23 | DRG: 291 | Disposition: A | Discharge status: Home Health | Payer: Medicare Other | Attending: Family Medicine | Admitting: Family Medicine

## 2020-12-20 DIAGNOSIS — Z79899 Other long term (current) drug therapy: Secondary | ICD-10-CM

## 2020-12-20 DIAGNOSIS — Z20822 Contact with and (suspected) exposure to covid-19: Secondary | ICD-10-CM | POA: Diagnosis not present

## 2020-12-20 DIAGNOSIS — I352 Nonrheumatic aortic (valve) stenosis with insufficiency: Secondary | ICD-10-CM | POA: Diagnosis not present

## 2020-12-20 DIAGNOSIS — Z96642 Presence of left artificial hip joint: Secondary | ICD-10-CM | POA: Diagnosis present

## 2020-12-20 DIAGNOSIS — I130 Hypertensive heart and chronic kidney disease with heart failure and stage 1 through stage 4 chronic kidney disease, or unspecified chronic kidney disease: Secondary | ICD-10-CM | POA: Diagnosis not present

## 2020-12-20 DIAGNOSIS — M1990 Unspecified osteoarthritis, unspecified site: Secondary | ICD-10-CM | POA: Diagnosis present

## 2020-12-20 DIAGNOSIS — Z886 Allergy status to analgesic agent status: Secondary | ICD-10-CM | POA: Diagnosis not present

## 2020-12-20 DIAGNOSIS — R0600 Dyspnea, unspecified: Secondary | ICD-10-CM

## 2020-12-20 DIAGNOSIS — I10 Essential (primary) hypertension: Secondary | ICD-10-CM | POA: Diagnosis present

## 2020-12-20 DIAGNOSIS — J90 Pleural effusion, not elsewhere classified: Secondary | ICD-10-CM

## 2020-12-20 DIAGNOSIS — I509 Heart failure, unspecified: Secondary | ICD-10-CM

## 2020-12-20 DIAGNOSIS — E039 Hypothyroidism, unspecified: Secondary | ICD-10-CM | POA: Diagnosis not present

## 2020-12-20 DIAGNOSIS — I5033 Acute on chronic diastolic (congestive) heart failure: Secondary | ICD-10-CM | POA: Diagnosis not present

## 2020-12-20 DIAGNOSIS — Z8541 Personal history of malignant neoplasm of cervix uteri: Secondary | ICD-10-CM

## 2020-12-20 DIAGNOSIS — Z888 Allergy status to other drugs, medicaments and biological substances status: Secondary | ICD-10-CM | POA: Diagnosis not present

## 2020-12-20 DIAGNOSIS — E876 Hypokalemia: Secondary | ICD-10-CM | POA: Diagnosis present

## 2020-12-20 DIAGNOSIS — Z681 Body mass index (BMI) 19 or less, adult: Secondary | ICD-10-CM

## 2020-12-20 DIAGNOSIS — K219 Gastro-esophageal reflux disease without esophagitis: Secondary | ICD-10-CM | POA: Diagnosis present

## 2020-12-20 DIAGNOSIS — R64 Cachexia: Secondary | ICD-10-CM | POA: Diagnosis not present

## 2020-12-20 DIAGNOSIS — I4891 Unspecified atrial fibrillation: Secondary | ICD-10-CM | POA: Diagnosis present

## 2020-12-20 DIAGNOSIS — Z825 Family history of asthma and other chronic lower respiratory diseases: Secondary | ICD-10-CM

## 2020-12-20 DIAGNOSIS — Z951 Presence of aortocoronary bypass graft: Secondary | ICD-10-CM

## 2020-12-20 DIAGNOSIS — I2510 Atherosclerotic heart disease of native coronary artery without angina pectoris: Secondary | ICD-10-CM | POA: Diagnosis not present

## 2020-12-20 DIAGNOSIS — I517 Cardiomegaly: Secondary | ICD-10-CM | POA: Diagnosis not present

## 2020-12-20 DIAGNOSIS — Z8616 Personal history of COVID-19: Secondary | ICD-10-CM | POA: Diagnosis not present

## 2020-12-20 DIAGNOSIS — Z7189 Other specified counseling: Secondary | ICD-10-CM | POA: Diagnosis not present

## 2020-12-20 DIAGNOSIS — N1831 Chronic kidney disease, stage 3a: Secondary | ICD-10-CM | POA: Diagnosis present

## 2020-12-20 DIAGNOSIS — I350 Nonrheumatic aortic (valve) stenosis: Secondary | ICD-10-CM

## 2020-12-20 DIAGNOSIS — Z66 Do not resuscitate: Secondary | ICD-10-CM | POA: Diagnosis not present

## 2020-12-20 DIAGNOSIS — Z515 Encounter for palliative care: Secondary | ICD-10-CM | POA: Diagnosis not present

## 2020-12-20 DIAGNOSIS — F0390 Unspecified dementia without behavioral disturbance: Secondary | ICD-10-CM | POA: Diagnosis present

## 2020-12-20 DIAGNOSIS — E7800 Pure hypercholesterolemia, unspecified: Secondary | ICD-10-CM | POA: Diagnosis not present

## 2020-12-20 DIAGNOSIS — E43 Unspecified severe protein-calorie malnutrition: Secondary | ICD-10-CM | POA: Insufficient documentation

## 2020-12-20 DIAGNOSIS — R0602 Shortness of breath: Secondary | ICD-10-CM | POA: Diagnosis not present

## 2020-12-20 DIAGNOSIS — Z79890 Hormone replacement therapy (postmenopausal): Secondary | ICD-10-CM

## 2020-12-20 DIAGNOSIS — Z7982 Long term (current) use of aspirin: Secondary | ICD-10-CM

## 2020-12-20 DIAGNOSIS — Z7401 Bed confinement status: Secondary | ICD-10-CM

## 2020-12-20 LAB — BASIC METABOLIC PANEL
Anion gap: 9 (ref 5–15)
BUN: 50 mg/dL — ABNORMAL HIGH (ref 8–23)
CO2: 21 mmol/L — ABNORMAL LOW (ref 22–32)
Calcium: 9.8 mg/dL (ref 8.9–10.3)
Chloride: 112 mmol/L — ABNORMAL HIGH (ref 98–111)
Creatinine, Ser: 1.64 mg/dL — ABNORMAL HIGH (ref 0.44–1.00)
GFR, Estimated: 30 mL/min — ABNORMAL LOW (ref >60)
Glucose, Bld: 120 mg/dL — ABNORMAL HIGH (ref 70–99)
Potassium: 3.7 mmol/L (ref 3.5–5.1)
Sodium: 142 mmol/L (ref 135–145)

## 2020-12-20 LAB — TROPONIN I (HIGH SENSITIVITY)
Troponin I (High Sensitivity): 53 ng/L — ABNORMAL HIGH (ref <18)
Troponin I (High Sensitivity): 51 ng/L — ABNORMAL HIGH (ref <18)

## 2020-12-20 LAB — BRAIN NATRIURETIC PEPTIDE: B Natriuretic Peptide: >4500 pg/mL — ABNORMAL HIGH (ref 0.0–100.0)

## 2020-12-20 LAB — CBC WITH DIFFERENTIAL/PLATELET
Abs Immature Granulocytes: 0.05 10*3/uL (ref 0.00–0.07)
Basophils Absolute: 0.1 10*3/uL (ref 0.0–0.1)
Basophils Relative: 1 %
Eosinophils Absolute: 0.1 10*3/uL (ref 0.0–0.5)
Eosinophils Relative: 1 %
HCT: 37.7 % (ref 36.0–46.0)
Hemoglobin: 12.2 g/dL (ref 12.0–15.0)
Immature Granulocytes: 1 %
Lymphocytes Relative: 17 %
Lymphs Abs: 1.3 10*3/uL (ref 0.7–4.0)
MCH: 31.9 pg (ref 26.0–34.0)
MCHC: 32.4 g/dL (ref 30.0–36.0)
MCV: 98.7 fL (ref 80.0–100.0)
Monocytes Absolute: 0.6 10*3/uL (ref 0.1–1.0)
Monocytes Relative: 9 %
Neutro Abs: 5.2 10*3/uL (ref 1.7–7.7)
Neutrophils Relative %: 71 %
Platelets: 191 10*3/uL (ref 150–400)
RBC: 3.82 MIL/uL — ABNORMAL LOW (ref 3.87–5.11)
RDW: 14.9 % (ref 11.5–15.5)
WBC: 7.3 10*3/uL (ref 4.0–10.5)
nRBC: 0 % (ref 0.0–0.2)

## 2020-12-20 LAB — RESP PANEL BY RT-PCR (FLU A&B, COVID) ARPGX2
Influenza A by PCR: NEGATIVE
Influenza B by PCR: NEGATIVE
SARS Coronavirus 2 by RT PCR: NEGATIVE

## 2020-12-20 MED ORDER — HYDRALAZINE HCL 20 MG/ML IJ SOLN: 5.0000 mg | INTRAMUSCULAR | Status: DC | PRN

## 2020-12-20 MED ORDER — AMLODIPINE BESYLATE 5 MG PO TABS
5.0000 mg | ORAL_TABLET | Freq: Every day | ORAL | Status: DC
  Filled 2020-12-20: qty 1

## 2020-12-20 MED ORDER — ENOXAPARIN SODIUM 40 MG/0.4ML IJ SOSY: 40.0000 mg | PREFILLED_SYRINGE | INTRAMUSCULAR | Status: DC

## 2020-12-20 MED ORDER — HEPARIN SODIUM (PORCINE) 5000 UNIT/ML IJ SOLN
5000.0000 [IU] | Freq: Three times a day (TID) | INTRAMUSCULAR | Status: DC
  Administered 2020-12-20 – 2020-12-23 (×6): 5000 [IU] via SUBCUTANEOUS
  Filled 2020-12-20 (×6): qty 1

## 2020-12-20 MED ORDER — SODIUM CHLORIDE 0.9% FLUSH
3.0000 mL | Freq: Two times a day (BID) | INTRAVENOUS | Status: DC
  Administered 2020-12-20 – 2020-12-23 (×6): 3 mL via INTRAVENOUS

## 2020-12-20 MED ORDER — FUROSEMIDE 10 MG/ML IJ SOLN
40.0000 mg | Freq: Two times a day (BID) | INTRAMUSCULAR | Status: DC
  Administered 2020-12-20: 21:00:00 40 mg via INTRAVENOUS
  Filled 2020-12-20 (×2): qty 4

## 2020-12-20 MED ORDER — FUROSEMIDE 10 MG/ML IJ SOLN
40.0000 mg | Freq: Once | INTRAMUSCULAR | Status: AC
  Administered 2020-12-20: 14:00:00 40 mg via INTRAVENOUS
  Filled 2020-12-20: qty 4

## 2020-12-20 MED ORDER — ACETAMINOPHEN 325 MG PO TABS: 650.0000 mg | ORAL_TABLET | Freq: Four times a day (QID) | ORAL | Status: DC | PRN

## 2020-12-20 MED ORDER — ACETAMINOPHEN 650 MG RE SUPP: 650.0000 mg | Freq: Four times a day (QID) | RECTAL | Status: DC | PRN

## 2020-12-20 MED ORDER — BISACODYL 5 MG PO TBEC: 5.0000 mg | DELAYED_RELEASE_TABLET | Freq: Every day | ORAL | Status: DC | PRN

## 2020-12-20 MED ORDER — POLYETHYLENE GLYCOL 3350 17 G PO PACK: 17.0000 g | PACK | Freq: Every day | ORAL | Status: DC | PRN

## 2020-12-20 MED ORDER — ASPIRIN 81 MG PO CHEW
81.0000 mg | CHEWABLE_TABLET | Freq: Every day | ORAL | Status: DC
  Administered 2020-12-21 – 2020-12-23 (×3): 81 mg via ORAL
  Filled 2020-12-20 (×3): qty 1

## 2020-12-20 MED ORDER — LEVOTHYROXINE SODIUM 75 MCG PO TABS
75.0000 ug | ORAL_TABLET | Freq: Every day | ORAL | Status: DC
  Administered 2020-12-21 – 2020-12-23 (×3): 75 ug via ORAL
  Filled 2020-12-20 (×3): qty 1

## 2020-12-20 MED ORDER — EZETIMIBE 10 MG PO TABS
10.0000 mg | ORAL_TABLET | Freq: Every day | ORAL | Status: DC
  Administered 2020-12-20 – 2020-12-22 (×3): 10 mg via ORAL
  Filled 2020-12-20 (×3): qty 1

## 2020-12-20 MED ORDER — MORPHINE SULFATE (PF) 2 MG/ML IV SOLN: 2.0000 mg | INTRAVENOUS | Status: DC | PRN

## 2020-12-20 MED ORDER — TRAZODONE HCL 50 MG PO TABS: 25.0000 mg | ORAL_TABLET | Freq: Every evening | ORAL | Status: DC | PRN

## 2020-12-20 MED ORDER — HYDROCODONE-ACETAMINOPHEN 10-325 MG PO TABS: 0.5000 | ORAL_TABLET | ORAL | Status: DC | PRN

## 2020-12-20 MED ORDER — DOCUSATE SODIUM 100 MG PO CAPS
100.0000 mg | ORAL_CAPSULE | Freq: Two times a day (BID) | ORAL | Status: DC
  Administered 2020-12-20 – 2020-12-23 (×6): 100 mg via ORAL
  Filled 2020-12-20 (×6): qty 1

## 2020-12-20 MED ORDER — CARVEDILOL 6.25 MG PO TABS
6.2500 mg | ORAL_TABLET | Freq: Two times a day (BID) | ORAL | Status: DC
  Administered 2020-12-20 – 2020-12-21 (×2): 6.25 mg via ORAL
  Filled 2020-12-20 (×2): qty 1

## 2020-12-20 MED ORDER — ONDANSETRON HCL 4 MG/2ML IJ SOLN: 4.0000 mg | Freq: Four times a day (QID) | INTRAMUSCULAR | Status: DC | PRN

## 2020-12-20 MED ORDER — ONDANSETRON HCL 4 MG PO TABS: 4.0000 mg | ORAL_TABLET | Freq: Four times a day (QID) | ORAL | Status: DC | PRN

## 2020-12-20 NOTE — ED Provider Notes (Signed)
Emergency Medicine Provider Triage Evaluation Note  [name] , a 87 y.o. female  was evaluated in triage.  Pt complains of shortness of breath x5 days.  She has not tried any medications for her symptoms.  She denies chest pain, abdominal pain, leg swelling, nausea, vomiting, fever, chills, cough.  She does not wear oxygen at home at baseline.  She denies history of asthma or COPD.  Last time she was in the hospital she was given Lasix and told to take it as needed.  She has not taken it recently.  Review of Systems  Positive: Shortness of breath Negative: Leg swelling  Physical Exam  BP (!) 148/118 (BP Location: Left Arm)   Pulse 82   Temp 97.9 F (36.6 C)   Resp 17   SpO2 98%  Gen:   Awake, no distress   Resp:  Normal effort, diminished lung sounds MSK:   Moves extremities without difficulty  Other:  No lower leg swelling bilaterally  Medical Decision Making  Medically screening exam initiated at 12:58 PM.  Appropriate orders placed.  [name] was informed that the remainder of the evaluation will be completed by another provider, this initial triage assessment does not replace that evaluation, and the importance of remaining in the ED until their evaluation is complete.    Trajan Grove A, PA-C 12/20/20 1259    Carmin Muskrat, MD 12/20/20 [phone]

## 2020-12-20 NOTE — Progress Notes (Signed)
Unable to complete admission history-pt has dementia and request her son be present. Jessie Foot, RN

## 2020-12-20 NOTE — ED Triage Notes (Signed)
C/o SOB Monday that was better Tuesday-Wednesday and worse again since Thursday.  Denies pain, cough, or increased lower extremity edema.

## 2020-12-20 NOTE — H&P (Signed)
History and Physical    [name] [id number] DOB: 10/13/1931 DOA: 12/20/2020  PCP: Lajean Manes, MD Consultants:  Johnsie Cancel - cardiology Patient coming from:  Home - lives with son; NOK: Lourdes Sledge, [phone]  Chief Complaint: SOB  HPI: [name] is a 87 y.o. female with medical history significant of cervical CA; stage 3 CKD; non-operable severe AS; dementia; CAD s/p CABG; HTN; HLD; and hypothyroidism presenting with SOB.  She reports that she has been having difficulty breathing.  She is not sure how long this has been going on, maybe a week.  She has not been coughing.  While she has been laying around it has gotten better.  She is not sure if she has ever been told she has afib.  I spoke with her son and he reports that they are aware of the valvular issue.  They have considered palliative care but are not sure what that means and whether it is time for that.  ED Course: SOB worsening with any exertion.  Appears to have mild volume overload.  No fever/chills/cough/CP.  Small B effusions on CXR.    Review of Systems: As per HPI; otherwise review of systems reviewed and negative.   Ambulatory Status:  Ambulates with a cane  COVID Vaccine Status:  Complete  Past Medical History:  Diagnosis Date   Anemia    Aortic stenosis    mild AS by 02/18/11  [name]   Arthritis    Bradycardia    Asymtomatic resolved   Cancer ([place])    ceervical   Chronic renal disease, stage III ([place])    Coronary artery disease    CABG 2003   Dyslipidemia    goal LSL less then 70 but statin intolerant   GERD (gastroesophageal reflux disease)    Hypertension    Hypothyroidism    Iron deficiency anemia 07/18/2010   Mild AI (aortic insufficiency)    Mild aortic stenosis    OP (osteoporosis) 10/18/2010   fosamax   Statin intolerance     Past Surgical History:  Procedure Laterality Date   ABDOMINAL HYSTERECTOMY     CARDIAC CATHETERIZATION     2004   cataracts      COLONOSCOPY  09/2010   normal EGD abd COLONOSCOPY   CORONARY ARTERY BYPASS GRAFT  2004   with LIMA to LAD, SVG to diagonal, SVG to OM, SOVG to PDA normal LVF   HIP ARTHROPLASTY Left 03/23/2020   Procedure: ARTHROPLASTY BIPOLAR HIP (HEMIARTHROPLASTY) anterior approach;  Surgeon: Rod Can, MD;  Location: [facility];  Service: Orthopedics;  Laterality: Left;   LUMBAR LAMINECTOMY  09/23/2011   Procedure: MICRODISCECTOMY LUMBAR LAMINECTOMY;  Surgeon: Marybelle Killings, MD;  Location: [place];  Service: Orthopedics;  Laterality: N/A;  L4-5 Decompression    Social History   Socioeconomic History   Marital status: Widowed    Spouse name: Not on file   Number of children: Not on file   Years of education: Not on file   Highest education level: Not on file  Occupational History   Not on file  Tobacco Use   Smoking status: Never   Smokeless tobacco: Never  Vaping Use   Vaping Use: Never used  Substance and Sexual Activity   Alcohol use: No   Drug use: No   Sexual activity: Not on file  Other Topics Concern   Not on file  Social History Narrative   Not on file   Social Determinants of Health  Financial Resource Strain: Not on file  Food Insecurity: Not on file  Transportation Needs: Not on file  Physical Activity: Not on file  Stress: Not on file  Social Connections: Not on file  Intimate Partner Violence: Not on file    Allergies  Allergen Reactions   Aleve [Naproxen Sodium] Hives   Aleve [Naproxen]     Other reaction(s): Unknown   Crestor [Rosuvastatin]     Other reaction(s): lethargy   Excedrin Extra Strength [Aspirin-Acetaminophen-Caffeine] Nausea And Vomiting   Meloxicam     Other reaction(s): felt poorly   Statins Other (See Comments)    No energy, very weak   Vytorin [Ezetimibe-Simvastatin] Other (See Comments)    Fatigue     Family History  Problem Relation Age of Onset   Heart Problems Mother    Asthma Father     Prior to Admission medications   Medication  Sig Start Date End Date Taking? Authorizing Provider  amLODipine (NORVASC) 5 MG tablet Take 5 mg by mouth daily. 02/07/17   [provider]  aspirin 81 MG chewable tablet Chew 81 mg by mouth daily.    [provider]  calcium gluconate 500 MG tablet Take 1 tablet by mouth at bedtime.  Patient not taking: Reported on 08/14/2020    [provider]  carvedilol (COREG) 6.25 MG tablet Take 1 tablet (6.25 mg total) by mouth 2 (two) times daily with a meal. 03/25/20   Hongalgi, Lenis Dickinson, MD  Cyanocobalamin (B-12 PO) Take 1 tablet by mouth daily.    [provider]  ezetimibe (ZETIA) 10 MG tablet Take 10 mg by mouth at bedtime.     [provider]  ferrous sulfate 325 (65 FE) MG tablet Take 325 mg by mouth at bedtime.    [provider]  HYDROcodone-acetaminophen (NORCO) 10-325 MG tablet Take 0.5-1 tablets by mouth every 4 (four) hours as needed for moderate pain.    [provider]  levothyroxine (SYNTHROID, LEVOTHROID) 75 MCG tablet Take 75 mcg by mouth at bedtime.    [provider]  Multiple Vitamin (MULTIVITAMIN WITH MINERALS) TABS tablet Take 1 tablet by mouth daily.    [provider]  Vitamin D, Ergocalciferol, (DRISDOL) 50000 UNITS CAPS Take 50,000 Units by mouth every Monday.     [provider]    Physical Exam: Vitals:   12/20/20 1500 12/20/20 1530 12/20/20 1600 12/20/20 1630  BP: (!) 146/91 119/80 139/79 (!) 152/92  Pulse: 75 69 70 65  Resp: 19 19 (!) 22 (!) 0  Temp:      SpO2: 100% 98% 100% 100%     General:  Appears calm and comfortable and is in NAD, on RA; frail, cachectic Eyes:  PERRL, EOMI, normal lids, iris ENT:  grossly normal hearing, lips & tongue, mmm Neck:  no LAD, masses or thyromegaly Cardiovascular:  RRR, no r/g. 2-3+ LE edema.  Respiratory:   CTA bilaterally with no wheezes/rales/rhonchi.  Mildly increased respiratory effort.  On RA. Abdomen:  soft, NT, ND Skin:  no rash or  induration seen on limited exam Musculoskeletal:  grossly normal tone BUE/BLE, good ROM, no bony abnormality Psychiatric:  grossly normal mood and affect, speech fluent and appropriate, AOx2 Neurologic:  CN 2-12 grossly intact, moves all extremities in coordinated fashion    Radiological Exams on Admission: Independently reviewed - see discussion in A/P where applicable  DG Chest 2 View  Result Date: 12/20/2020 CLINICAL DATA:  Shortness of breath for 5 days EXAM:  CHEST - 2 VIEW COMPARISON:  Chest x-ray dated 06/25/2020. Chest x-ray dated 03/22/2020. FINDINGS: Small bibasilar pleural effusions and/or chronic pleural thickening, similar to appearance on chest x-ray of 06/25/2020 but new compared to the earlier chest x-ray of 03/22/2020. No new lung findings. No pneumothorax is seen. Stable cardiomegaly. Median sternotomy wires appear intact and stable in alignment. No acute-appearing osseous abnormality. IMPRESSION: 1. Small bibasilar pleural effusions and/or chronic pleural thickening, stable compared to most recent chest x-ray of 06/25/2020. 2. Stable cardiomegaly. 3. No new lung findings. No evidence of pneumonia or pulmonary edema. Electronically Signed   By: Franki Cabot M.D.   On: 12/20/2020 14:09    EKG: Independently reviewed.  New onset afib with rate 79; nonspecific ST changes with significant artifact   Labs on Admission: I have personally reviewed the available labs and imaging studies at the time of the admission.  Pertinent labs:   Glucose 120 BUN 50/Creatinine 1.64/GFR 30 - stable BNP >4500 HS troponin 53 Normal CBC COVID/flu negative   Assessment/Plan Principal Problem:   Acute on chronic diastolic CHF (congestive heart failure) (HCC) Active Problems:   Essential hypertension, benign   Pure hypercholesterolemia   Aortic stenosis   Dementia without behavioral disturbance (HCC)   DNR (do not resuscitate)    Acute on chronic diastolic CHF with severe AS -Patient  with known h/o chronic diastolic CHF presenting with worsening SOB, no hypoxia -She also has severe but non-operable AS -While in the ER there was intermittent concern for new onset afib -CXR without pulmonary edema -Markedly elevated BNP, similar to prior -During her last hospitalization, which was similar to this one, she improved with diuresis -Will place in observation status with telemetry, as there are no current findings necessitating admission (hemodynamic instability, severe electrolyte abnormalities, cardiac arrhythmias, ACS, severe pulmonary edema requiring new O2 therapy, AMS) -CHF order set utilized -Was given Lasix 40 mg x 1 in ER and will repeat with 40 mg IV BID -prn [place] O2 for now -If she does not improve with diuresis, her symptoms are more likely related to the severe AS and she may be appropriate for transition to hospice -For now, family requests palliative care consult -Will repeat EKG as afib has not been clearly documented  HTN -Continue Norvasc, Coreg -Will also add prn hydralazine  HLD -Continue Zetia, as she is statin intolerant  CAD -s/p CABG -Continue ASA  Dementia -She is not taking medications for this issue -Her son reports that she is no longer the person she used to be and it is very hard -Will add delirium precautions  Stage 3a CD -Appears to be stable at this time  Hypothyroidism -Continue Synthroid  DNR -I have discussed code status with the patient's son and the patient would not desire resuscitation and would prefer to die a natural death should that situation arise. -She has a gold out of facility DNR form in her ACP documents    Note: This patient has been tested and is negative for the novel coronavirus COVID-19. She has been fully vaccinated against COVID-19.   DVT prophylaxis: Lovenox  Code Status:  DNR - confirmed with family/ACP documents Family Communication: None present; I spoke with her son by telephone at the time of  admission Disposition Plan:  The patient is from: home  Anticipated d/c is to: home, possibly with [facility] services  Anticipated d/c date will depend on clinical response to treatment, possibly as early as tomorrow if she has a good clinical repsonse  Patient is currently: acutely ill Consults called: Palliative care; TOC team; PT/OT; nutrition; heart failure navigator Admission status: It is my clinical opinion that referral for OBSERVATION is reasonable and necessary in this patient based on the above information provided. The aforementioned taken together are felt to place the patient at high risk for further clinical deterioration. However it is anticipated that the patient may be medically stable for discharge from the hospital within 24 to 48 hours.     Karmen Bongo MD [facility]   How to contact the [facility] Attending or Consulting provider [place] or covering provider during after hours [place], for this patient?  Check the care team in [facility] and look for a) attending/consulting [facility] provider listed and b) the [facility] team listed Log into [email or web address] and use [place]'s universal password to access. If you do not have the password, please contact the hospital operator. Locate the [facility] provider you are looking for under [facility] and page to a number that you can be directly reached. If you still have difficulty reaching the provider, please page the [facility] (Director on Call) for the Hospitalists listed on amion for assistance.   12/20/2020, 6:20 PM

## 2020-12-20 NOTE — ED Notes (Signed)
Pt noted to be SOB intermittently. Pt will easily speak in full sentences and then, be short of breath.  Son reports this has been happening  1 week.

## 2020-12-20 NOTE — ED Notes (Signed)
Patient transported to X-ray 

## 2020-12-20 NOTE — Progress Notes (Signed)
Patient transferred from ED at 1900hrs. Oriented to room and plan of care.  Bed alarm on.

## 2020-12-20 NOTE — ED Provider Notes (Signed)
[facility] EMERGENCY DEPARTMENT Provider Note   CSN: [id number] Arrival date & time: 12/20/20  1218     History Chief Complaint  Patient presents with   Shortness of Breath    [name] is a 87 y.o. female.  HPI 87 year old female history of CKD, aortic stenosis, nocardia, CAD, hypertension presents today complaining of dyspnea on exertion.  She denies any fever, chills, congestion, chest pain, history of DVT, lateralized leg swelling, or PE.  She states that she has had some increasing dyspnea over days to weeks.  Over the past several days she has had increased dyspnea with any exertion.  She is not on home oxygen.  She denies any exposure to COVID or flu.  She had COVID over a year ago.  She has not had COVID boosters or flu shots this year.  She denies chest pain, nausea, vomiting, diarrhea.  She has not noted any increase in peripheral edema.  She has not noted any lateralized leg swelling and denies any recent DVT risk factors.  Reports that she has a bad valve and was told that she was not a surgical candidate Review of records from [facility] showed that she has had an echo 11/28/2019 with normal EF.  She has advanced dementia and is not a TAVR candidate.  She was admitted with CHF June 27, 2020 with a BNP of 4500.  This was responsive to Lasix  Past Medical History:  Diagnosis Date   Anemia    Aortic stenosis    mild AS by 02/18/11  [name]   Arthritis    Bradycardia    Asymtomatic resolved   Cancer ([place])    ceervical   Chronic renal disease, stage III ([place])    Coronary artery disease    CABG 2003   Dyslipidemia    goal LSL less then 70 but statin intolerant   GERD (gastroesophageal reflux disease)    Hypertension    Hypothyroidism    Iron deficiency anemia 07/18/2010   Mild AI (aortic insufficiency)    Mild aortic stenosis    OP (osteoporosis) 10/18/2010   fosamax   Statin intolerance     Patient Active Problem List    Diagnosis Date Noted   Acute on chronic diastolic CHF (congestive heart failure) ([place]) 12/20/2020   Acute on chronic diastolic (congestive) heart failure ([place]) 06/25/2020   Acute CHF (congestive heart failure) ([place]) 06/25/2020   Fall    Left displaced femoral neck fracture ([place]) 03/22/2020   Renal insufficiency 03/22/2020   Coronary atherosclerosis of native coronary artery 04/23/2013   Essential hypertension, benign 04/23/2013   Pure hypercholesterolemia 04/23/2013   Aortic stenosis    Spinal stenosis, lumbar 09/23/2011    Class: Diagnosis of    Past Surgical History:  Procedure Laterality Date   ABDOMINAL HYSTERECTOMY     CARDIAC CATHETERIZATION     2004   cataracts     COLONOSCOPY  09/2010   normal EGD abd COLONOSCOPY   CORONARY ARTERY BYPASS GRAFT  2004   with LIMA to LAD, SVG to diagonal, SVG to OM, SOVG to PDA normal LVF   HIP ARTHROPLASTY Left 03/23/2020   Procedure: ARTHROPLASTY BIPOLAR HIP (HEMIARTHROPLASTY) anterior approach;  Surgeon: Rod Can, MD;  Location: [facility];  Service: Orthopedics;  Laterality: Left;   LUMBAR LAMINECTOMY  09/23/2011   Procedure: MICRODISCECTOMY LUMBAR LAMINECTOMY;  Surgeon: Marybelle Killings, MD;  Location: [facility];  Service: Orthopedics;  Laterality: N/A;  L4-5 Decompression  OB History   No obstetric history on file.     Family History  Problem Relation Age of Onset   Heart Problems Mother    Asthma Father     Social History   Tobacco Use   Smoking status: Never   Smokeless tobacco: Never  Vaping Use   Vaping Use: Never used  Substance Use Topics   Alcohol use: No   Drug use: No    Home Medications Prior to Admission medications   Medication Sig Start Date End Date Taking? Authorizing Provider  amLODipine (NORVASC) 5 MG tablet Take 5 mg by mouth daily. 02/07/17   [provider]  aspirin 81 MG chewable tablet Chew 81 mg by mouth daily.    [provider]  calcium gluconate 500 MG tablet Take 1 tablet by  mouth at bedtime.  Patient not taking: Reported on 08/14/2020    [provider]  carvedilol (COREG) 6.25 MG tablet Take 1 tablet (6.25 mg total) by mouth 2 (two) times daily with a meal. 03/25/20   Hongalgi, Lenis Dickinson, MD  Cyanocobalamin (B-12 PO) Take 1 tablet by mouth daily.    [provider]  ezetimibe (ZETIA) 10 MG tablet Take 10 mg by mouth at bedtime.     [provider]  ferrous sulfate 325 (65 FE) MG tablet Take 325 mg by mouth at bedtime.    [provider]  HYDROcodone-acetaminophen (NORCO) 10-325 MG tablet Take 0.5-1 tablets by mouth every 4 (four) hours as needed for moderate pain.    [provider]  levothyroxine (SYNTHROID, LEVOTHROID) 75 MCG tablet Take 75 mcg by mouth at bedtime.    [provider]  Multiple Vitamin (MULTIVITAMIN WITH MINERALS) TABS tablet Take 1 tablet by mouth daily.    [provider]  Vitamin D, Ergocalciferol, (DRISDOL) 50000 UNITS CAPS Take 50,000 Units by mouth every Monday.     [provider]    Allergies    Aleve [naproxen sodium], Aleve [naproxen], Crestor [rosuvastatin], Excedrin extra strength [aspirin-acetaminophen-caffeine], Meloxicam, Statins, and Vytorin [ezetimibe-simvastatin]  Review of Systems   Review of Systems  All other systems reviewed and are negative.  Physical Exam Updated Vital Signs BP 139/79   Pulse 70   Temp 97.9 F (36.6 C)   Resp (!) 22   SpO2 100%   Physical Exam Vitals and nursing note reviewed.  Constitutional:      General: She is not in acute distress.    Appearance: She is well-developed.  HENT:     Head: Normocephalic and atraumatic.     Right Ear: External ear normal.     Left Ear: External ear normal.     Nose: Nose normal.  Eyes:     Conjunctiva/sclera: Conjunctivae normal.     Pupils: Pupils are equal, round, and reactive to light.  Pulmonary:     Effort: Pulmonary effort is normal. Tachypnea present.     Breath sounds:  Examination of the right-lower field reveals decreased breath sounds. Examination of the left-lower field reveals decreased breath sounds. Decreased breath sounds present.  Abdominal:     General: Bowel sounds are normal.     Palpations: Abdomen is soft.  Musculoskeletal:        General: Normal range of motion.     Cervical back: Normal range of motion and neck supple.  Skin:    General: Skin is warm and dry.  Neurological:     Mental Status: She is alert and oriented to person, place,  and time.     Motor: No abnormal muscle tone.     Coordination: Coordination normal.  Psychiatric:        Behavior: Behavior normal.        Thought Content: Thought content normal.    ED Results / Procedures / Treatments   Labs (all labs ordered are listed, but only abnormal results are displayed) Labs Reviewed  BASIC METABOLIC PANEL - Abnormal; Notable for the following components:      Result Value   Chloride 112 (*)    CO2 21 (*)    Glucose, Bld 120 (*)    BUN 50 (*)    Creatinine, Ser 1.64 (*)    GFR, Estimated 30 (*)    All other components within normal limits  CBC WITH DIFFERENTIAL/PLATELET - Abnormal; Notable for the following components:   RBC 3.82 (*)    All other components within normal limits  BRAIN NATRIURETIC PEPTIDE - Abnormal; Notable for the following components:   B Natriuretic Peptide >4,500.0 (*)    All other components within normal limits  TROPONIN I (HIGH SENSITIVITY) - Abnormal; Notable for the following components:   Troponin I (High Sensitivity) 53 (*)    All other components within normal limits  TROPONIN I (HIGH SENSITIVITY) - Abnormal; Notable for the following components:   Troponin I (High Sensitivity) 51 (*)    All other components within normal limits  RESP PANEL BY RT-PCR (FLU A&B, COVID) [id number]    EKG EKG Interpretation  Date/Time:  Sunday December 20 2020 12:31:03 EST Ventricular Rate:  79 PR Interval:  136 QRS Duration: 80 QT Interval:  386 QTC  Calculation: 442 R Axis:   97 Text Interpretation: Normal sinus rhythm Rightward axis ST & T wave abnormality, consider inferior ischemia Abnormal ECG No significant change since last tracing 25 June 2020 Confirmed by ,  ([id number]) on 12/20/2020 1:31:41 PM  Radiology DG Chest 2 View  Result Date: 12/20/2020 CLINICAL DATA:  Shortness of breath for 5 days EXAM: CHEST - 2 VIEW COMPARISON:  Chest x- dated 06/25/2020. Chest x- dated 03/22/2020. FINDINGS: Small bibasilar pleural effusions and/or chronic pleural thickening, similar to appearance on chest x- of 06/25/2020 but new compared to the earlier chest x- of 03/22/2020. No new lung findings. No pneumothorax is seen. Stable cardiomegaly. Median sternotomy wires appear intact and stable in alignment. No acute-appearing osseous abnormality. IMPRESSION: 1. Small bibasilar pleural effusions and/or chronic pleural thickening, stable compared to most recent chest x- of 06/25/2020. 2. Stable cardiomegaly. 3. No new lung findings. No evidence of pneumonia or pulmonary edema. Electronically Signed   By: Stan  Maynard M.D.   On: 12/20/2020 14:09    Procedures Procedures   Medications Ordered in ED Medications  furosemide (LASIX) injection 40 mg (40 mg Intravenous Given 12/20/20 1427)    ED Course  I have reviewed the triage vital signs and the nursing notes.  Pertinent labs & imaging results that were available during my care of the patient were reviewed by me and considered in my medical decision making (see chart for details).    MDM Rules/Calculators/A&P                           87  year old female presents today with dyspnea on exertion.  Patient with some small bilateral pleural effusions.  She presented similarly the past.  BNP is elevated at greater than 4500.  She is receiving some diuresis here in the ED.  She has remained hemodynamically stable here in the ED.  Care discussed with Dr. Lorin Mercy will continue diuresis and  admit for further evaluation treatment Final Clinical Impression(s) / ED Diagnoses Final diagnoses:  Dyspnea, unspecified type  Pleural effusion    Rx / DC Orders ED Discharge Orders     None        Pattricia Boss, MD 12/20/20 1631

## 2020-12-20 NOTE — ED Notes (Signed)
Patient bed linen changed with new purewick in place. Patient sitting up in bed eating diner without difficulty. Denies further needs

## 2020-12-21 DIAGNOSIS — I4891 Unspecified atrial fibrillation: Secondary | ICD-10-CM | POA: Diagnosis present

## 2020-12-21 DIAGNOSIS — M1990 Unspecified osteoarthritis, unspecified site: Secondary | ICD-10-CM | POA: Diagnosis present

## 2020-12-21 DIAGNOSIS — Z20822 Contact with and (suspected) exposure to covid-19: Secondary | ICD-10-CM | POA: Diagnosis present

## 2020-12-21 DIAGNOSIS — I350 Nonrheumatic aortic (valve) stenosis: Secondary | ICD-10-CM

## 2020-12-21 DIAGNOSIS — F0390 Unspecified dementia without behavioral disturbance: Secondary | ICD-10-CM

## 2020-12-21 DIAGNOSIS — I2510 Atherosclerotic heart disease of native coronary artery without angina pectoris: Secondary | ICD-10-CM | POA: Diagnosis present

## 2020-12-21 DIAGNOSIS — Z66 Do not resuscitate: Secondary | ICD-10-CM | POA: Diagnosis present

## 2020-12-21 DIAGNOSIS — I352 Nonrheumatic aortic (valve) stenosis with insufficiency: Secondary | ICD-10-CM | POA: Diagnosis present

## 2020-12-21 DIAGNOSIS — E43 Unspecified severe protein-calorie malnutrition: Secondary | ICD-10-CM | POA: Insufficient documentation

## 2020-12-21 DIAGNOSIS — E7800 Pure hypercholesterolemia, unspecified: Secondary | ICD-10-CM | POA: Diagnosis present

## 2020-12-21 DIAGNOSIS — I509 Heart failure, unspecified: Secondary | ICD-10-CM

## 2020-12-21 DIAGNOSIS — Z7189 Other specified counseling: Secondary | ICD-10-CM

## 2020-12-21 DIAGNOSIS — Z951 Presence of aortocoronary bypass graft: Secondary | ICD-10-CM | POA: Diagnosis not present

## 2020-12-21 DIAGNOSIS — E039 Hypothyroidism, unspecified: Secondary | ICD-10-CM | POA: Diagnosis present

## 2020-12-21 DIAGNOSIS — Z515 Encounter for palliative care: Secondary | ICD-10-CM

## 2020-12-21 DIAGNOSIS — E876 Hypokalemia: Secondary | ICD-10-CM | POA: Diagnosis present

## 2020-12-21 DIAGNOSIS — I5033 Acute on chronic diastolic (congestive) heart failure: Secondary | ICD-10-CM | POA: Diagnosis present

## 2020-12-21 DIAGNOSIS — I130 Hypertensive heart and chronic kidney disease with heart failure and stage 1 through stage 4 chronic kidney disease, or unspecified chronic kidney disease: Secondary | ICD-10-CM | POA: Diagnosis present

## 2020-12-21 DIAGNOSIS — Z7401 Bed confinement status: Secondary | ICD-10-CM | POA: Diagnosis not present

## 2020-12-21 DIAGNOSIS — Z886 Allergy status to analgesic agent status: Secondary | ICD-10-CM | POA: Diagnosis not present

## 2020-12-21 DIAGNOSIS — R64 Cachexia: Secondary | ICD-10-CM | POA: Diagnosis present

## 2020-12-21 DIAGNOSIS — Z888 Allergy status to other drugs, medicaments and biological substances status: Secondary | ICD-10-CM | POA: Diagnosis not present

## 2020-12-21 DIAGNOSIS — Z96642 Presence of left artificial hip joint: Secondary | ICD-10-CM | POA: Diagnosis present

## 2020-12-21 DIAGNOSIS — N1831 Chronic kidney disease, stage 3a: Secondary | ICD-10-CM | POA: Diagnosis present

## 2020-12-21 DIAGNOSIS — Z681 Body mass index (BMI) 19 or less, adult: Secondary | ICD-10-CM | POA: Diagnosis not present

## 2020-12-21 DIAGNOSIS — Z825 Family history of asthma and other chronic lower respiratory diseases: Secondary | ICD-10-CM | POA: Diagnosis not present

## 2020-12-21 DIAGNOSIS — Z8616 Personal history of COVID-19: Secondary | ICD-10-CM | POA: Diagnosis not present

## 2020-12-21 DIAGNOSIS — Z8541 Personal history of malignant neoplasm of cervix uteri: Secondary | ICD-10-CM | POA: Diagnosis not present

## 2020-12-21 LAB — BASIC METABOLIC PANEL
Anion gap: 8 (ref 5–15)
BUN: 50 mg/dL — ABNORMAL HIGH (ref 8–23)
CO2: 25 mmol/L (ref 22–32)
Calcium: 9.5 mg/dL (ref 8.9–10.3)
Chloride: 107 mmol/L (ref 98–111)
Creatinine, Ser: 2.03 mg/dL — ABNORMAL HIGH (ref 0.44–1.00)
GFR, Estimated: 23 mL/min — ABNORMAL LOW (ref >60)
Glucose, Bld: 107 mg/dL — ABNORMAL HIGH (ref 70–99)
Potassium: 3.5 mmol/L (ref 3.5–5.1)
Sodium: 140 mmol/L (ref 135–145)

## 2020-12-21 LAB — CBC
HCT: 34.4 % — ABNORMAL LOW (ref 36.0–46.0)
Hemoglobin: 11.3 g/dL — ABNORMAL LOW (ref 12.0–15.0)
MCH: 31.3 pg (ref 26.0–34.0)
MCHC: 32.8 g/dL (ref 30.0–36.0)
MCV: 95.3 fL (ref 80.0–100.0)
Platelets: 180 10*3/uL (ref 150–400)
RBC: 3.61 MIL/uL — ABNORMAL LOW (ref 3.87–5.11)
RDW: 15 % (ref 11.5–15.5)
WBC: 7.4 10*3/uL (ref 4.0–10.5)
nRBC: 0 % (ref 0.0–0.2)

## 2020-12-21 MED ORDER — VITAMIN B-12 1000 MCG PO TABS
1000.0000 ug | ORAL_TABLET | Freq: Every day | ORAL | Status: DC
  Administered 2020-12-21 – 2020-12-23 (×3): 1000 ug via ORAL
  Filled 2020-12-21 (×3): qty 1

## 2020-12-21 MED ORDER — CARVEDILOL 3.125 MG PO TABS
3.1250 mg | ORAL_TABLET | Freq: Two times a day (BID) | ORAL | Status: DC
  Administered 2020-12-22: 3.125 mg via ORAL
  Filled 2020-12-21 (×2): qty 1

## 2020-12-21 MED ORDER — ENSURE ENLIVE PO LIQD
237.0000 mL | Freq: Three times a day (TID) | ORAL | Status: DC
  Administered 2020-12-21 – 2020-12-22 (×4): 237 mL via ORAL

## 2020-12-21 MED ORDER — ADULT MULTIVITAMIN W/MINERALS CH
1.0000 | ORAL_TABLET | Freq: Every day | ORAL | Status: DC
  Administered 2020-12-21 – 2020-12-23 (×3): 1 via ORAL
  Filled 2020-12-21 (×3): qty 1

## 2020-12-21 MED ORDER — VITAMIN D 25 MCG (1000 UNIT) PO TABS
5000.0000 [IU] | ORAL_TABLET | Freq: Every day | ORAL | Status: DC
  Administered 2020-12-21 – 2020-12-23 (×3): 5000 [IU] via ORAL
  Filled 2020-12-21 (×3): qty 5

## 2020-12-21 NOTE — Progress Notes (Signed)
Initial Nutrition Assessment  DOCUMENTATION CODES:   Severe malnutrition in context of chronic illness, Underweight  INTERVENTION:  - Ensure Enlive po TID, each supplement provides 350 kcal and 20 grams of protein  - MVI with minerals daily  - Recommend diet liberalization from heart healthy to regular diet; spoke with MD who agrees  NUTRITION DIAGNOSIS:   Severe Malnutrition related to chronic illness (acute on chronic CHF, non-operable severe AS) as evidenced by severe fat depletion, severe muscle depletion.  GOAL:   Patient will meet greater than or equal to 90% of their needs  MONITOR:   PO intake, Supplement acceptance, Labs, Weight trends  REASON FOR ASSESSMENT:   Consult Other (Comment) (nutrition goals)  ASSESSMENT:   Pt admitted from home with SOB secondary to acute on chronic CHF. PMH includes cervical cancer, CKD III, non-operable severe AS, dementia, CAD s/p CABG, HTN, HLD, and hypothyroidism.  Noted plans for consult to palliative care to discuss [place]  Pt sitting in chair with no family present during visit. Unable to obtain detailed nutrition and weight history from pt. She states that she lives at home with her son and she prepares meals which vary. She reports eating 2-4 meals per day and just eating when she is hungry and until she is satisfied.   Per review of chart, limited weight history however noted steady weight loss over the past few years. Will continue to monitor.  Medications: Vitamin D3, colace, zetia, synthroid, Vitamin B12  Labs: BUN 50, Cr 2.03  UOP: 1277ml x12 hours I/O's: -733ml since admission  NUTRITION - FOCUSED PHYSICAL EXAM:  Flowsheet Row Most Recent Value  Orbital Region Severe depletion  Upper Arm Region Moderate depletion  Thoracic and Lumbar Region Severe depletion  Buccal Region Moderate depletion  Temple Region Severe depletion  Clavicle Bone Region Severe depletion  Clavicle and Acromion Bone Region Severe depletion   Scapular Bone Region Severe depletion  Dorsal Hand Severe depletion  Patellar Region Severe depletion  Anterior Thigh Region Severe depletion  Posterior Calf Region Severe depletion  Edema (RD Assessment) None  Hair Reviewed  Eyes Reviewed  Mouth Reviewed  Skin Reviewed  Nails Reviewed       Diet Order:   Diet Order             Diet Heart Room service appropriate? Yes; Fluid consistency: Thin; Fluid restriction: 1500 mL Fluid  Diet effective now                   EDUCATION NEEDS:   Education needs have been addressed  Skin:  Skin Assessment: Reviewed RN Assessment  Last BM:  12/18/20  Height:   Ht Readings from Last 1 Encounters:  12/20/20 5' (1.524 m)    Weight:   Wt Readings from Last 1 Encounters:  12/21/20 40 kg    BMI:  Body mass index is 17.22 kg/m.  Estimated Nutritional Needs:   Kcal:  1500-1700  Protein:  75-85g  Fluid:  >/=1.5L  Clayborne Dana, RDN, LDN Clinical Nutrition

## 2020-12-21 NOTE — Progress Notes (Signed)
Progress Note    [name]  KXF:[id number] DOB: 03/30/1931  DOA: 12/20/2020 PCP: Lajean Manes, MD    Brief Narrative:    Medical records reviewed and are as summarized below:  [name] is an 87 y.o. female with medical history significant of cervical CA; stage 3 CKD; non-operable severe AS; dementia; CAD s/p CABG; HTN; HLD; and hypothyroidism presenting with SOB.  She reports that she has been having difficulty breathing.  She is not sure how long this has been going on, maybe a week.  Needs palliative care talks.    Assessment/Plan:   Principal Problem:   Acute on chronic diastolic CHF (congestive heart failure) (HCC) Active Problems:   Essential hypertension, benign   Pure hypercholesterolemia   Aortic stenosis   Dementia without behavioral disturbance (HCC)   DNR (do not resuscitate)   CHF (congestive heart failure) (HCC)   Acute on chronic diastolic CHF with severe AS -Patient with known h/o chronic diastolic CHF presenting with worsening SOB, no hypoxia -She also has severe but non-operable AS per cardiology -While in the ER there was intermittent concern for new onset afib -CXR without pulmonary edema -Markedly elevated BNP, similar to prior -During her last hospitalization, which was similar to this one, she improved with diuresis -Was given Lasix 40 mg x 1 in ER and will repeat with 40 mg IV BID - palliative care consult   HTN -Continue Norvasc, Coreg (lower dose as she is bradycardic) -Will also add prn hydralazine   HLD -Continue Zetia, as she is statin intolerant   CAD -s/p CABG -Continue ASA   Dementia -She is not taking medications for this issue -delirium precautions   Stage 3a CD -Appears to be stable at this time   Hypothyroidism -Continue Synthroid   DNR -palliative care consult- comfort care appropriate    Family Communication/Anticipated D/C date and plan/Code Status   DVT prophylaxis: heparin Code Status:  dnr Family Communication: son not at bedside Disposition Plan: Status is: [place] Consultants:   Palliative care  Subjective:   No dizziness  Objective:    Vitals:   12/21/20 0433 12/21/20 0852 12/21/20 0957 12/21/20 1320  BP: 109/71 98/71  122/71  Pulse: 61 65 63 60  Resp: 19 15 16 16   Temp: 69.6 F (78.9 C)   97.9 F (36.6 C)  TempSrc: Oral     SpO2: 100% 99% 97% 100%  Weight: 40 kg     Height:        Intake/Output Summary (Last 24 hours) at 12/21/2020 1409 Last data filed at 12/21/2020 0456 Gross per 24 hour  Intake 480 ml  Output 1200 ml  Net -720 ml   Filed Weights   12/20/20 1907 12/21/20 0433  Weight: 40.3 kg 40 kg    Exam:  General: Appearance:    Thin female in no acute distress     Lungs:      respirations unlabored  Heart:    Normal heart rate.   MS:   All extremities are intact.    Neurologic:   Awake, alert, pleasant     Data Reviewed:   I have personally reviewed following labs and imaging studies:  Labs: Labs show the following:   Basic Metabolic Panel: Recent Labs  Lab 12/20/20 1301 12/21/20 0239  NA 142 140  K 3.7 3.5  CL 112* 107  CO2 21* 25  GLUCOSE 120* 107*  BUN 50* 50*  CREATININE 1.64* 2.03*  CALCIUM 9.8 9.5   GFR Estimated Creatinine Clearance: 12.3 mL/min (A) (by C-G formula based on SCr of 2.03 mg/dL (H)). Liver Function Tests: No results for input(s): AST, ALT, ALKPHOS, BILITOT, PROT, ALBUMIN in the last 168 hours. No results for input(s): LIPASE, AMYLASE in the last 168 hours. No results for input(s): AMMONIA in the last 168 hours. Coagulation profile No results for input(s): INR, PROTIME in the last 168 hours.  CBC: Recent Labs  Lab 12/20/20 1301 12/21/20 0239  WBC 7.3 7.4  NEUTROABS 5.2  --   HGB 12.2 11.3*  HCT 37.7 34.4*  MCV 98.7 95.3  PLT 191 180   Cardiac Enzymes: No results for input(s): CKTOTAL, CKMB, CKMBINDEX, TROPONINI in the last 168 hours. BNP (last 3 results) No  results for input(s): PROBNP in the last 8760 hours. CBG: No results for input(s): GLUCAP in the last 168 hours. D-Dimer: No results for input(s): DDIMER in the last 72 hours. Hgb A1c: No results for input(s): HGBA1C in the last 72 hours. Lipid Profile: No results for input(s): CHOL, HDL, LDLCALC, TRIG, CHOLHDL, LDLDIRECT in the last 72 hours. Thyroid function studies: No results for input(s): TSH, T4TOTAL, T3FREE, THYROIDAB in the last 72 hours.  Invalid input(s): FREET3 Anemia work up: No results for input(s): VITAMINB12, FOLATE, FERRITIN, TIBC, IRON, RETICCTPCT in the last 72 hours. Sepsis Labs: Recent Labs  Lab 12/20/20 1301 12/21/20 0239  WBC 7.3 7.4    Microbiology Recent Results (from the past 240 hour(s))  Resp Panel by RT-PCR (Flu A&B, Covid) Nasopharyngeal Swab     Status: None   Collection Time: 12/20/20  1:47 PM   Specimen: Nasopharyngeal Swab; Nasopharyngeal(NP) swabs in vial transport medium  Result Value Ref Range Status   SARS Coronavirus 2 by RT PCR NEGATIVE NEGATIVE Final    Comment: (NOTE) SARS-CoV-2 target nucleic acids are NOT DETECTED.  The SARS-CoV-2 RNA is generally detectable in upper respiratory specimens during the acute phase of infection. The lowest concentration of SARS-CoV-2 viral copies this assay can detect is 138 copies/mL. A negative result does not preclude SARS-Cov-2 infection and should not be used as the sole basis for treatment or other patient management decisions. A negative result may occur with  improper specimen collection/handling, submission of specimen other than nasopharyngeal swab, presence of viral mutation(s) within the areas targeted by this assay, and inadequate number of viral copies(<138 copies/mL). A negative result must be combined with clinical observations, patient history, and epidemiological information. The expected result is Negative.  Fact Sheet for Patients:   EntrepreneurPulse.com.au  Fact Sheet for Healthcare Providers:  IncredibleEmployment.be  This test is no t yet approved or cleared by the [place] FDA and  has been authorized for detection and/or diagnosis of SARS-CoV-2 by FDA under an Emergency Use Authorization (EUA). This EUA will remain  in effect (meaning this test can be used) for the duration of the COVID-19 declaration under Section 564(b)(1) of the Act, 21 U.S.C.section 360bbb-3(b)(1), unless the authorization is terminated  or revoked sooner.       Influenza A by PCR NEGATIVE NEGATIVE Final   Influenza B by PCR NEGATIVE NEGATIVE Final    Comment: (NOTE) The Xpert Xpress SARS-CoV-2/FLU/RSV plus assay is intended as an aid in the diagnosis of influenza from Nasopharyngeal swab specimens and should not be used as a sole basis for treatment. Nasal washings and aspirates are unacceptable for Xpert Xpress SARS-CoV-2/FLU/RSV testing.  Fact Sheet for Patients: EntrepreneurPulse.com.au  Fact Sheet for  Healthcare Providers: IncredibleEmployment.be  This test is not yet approved or cleared by the [place] and has been authorized for detection and/or diagnosis of SARS-CoV-2 by FDA under an Emergency Use Authorization (EUA). This EUA will remain in effect (meaning this test can be used) for the duration of the COVID-19 declaration under Section 564(b)(1) of the Act, 21 U.S.C. section 360bbb-3(b)(1), unless the authorization is terminated or revoked.  Performed at [facility], [place] [address], [place], [place] [address]     Procedures and diagnostic studies:  DG Chest 2 View  Result Date: 12/20/2020 CLINICAL DATA:  Shortness of breath for 5 days EXAM: CHEST - 2 VIEW COMPARISON:  Chest x-ray dated 06/25/2020. Chest x-ray dated 03/22/2020. FINDINGS: Small bibasilar pleural effusions and/or chronic pleural thickening, similar to  appearance on chest x-ray of 06/25/2020 but new compared to the earlier chest x-ray of 03/22/2020. No new lung findings. No pneumothorax is seen. Stable cardiomegaly. Median sternotomy wires appear intact and stable in alignment. No acute-appearing osseous abnormality. IMPRESSION: 1. Small bibasilar pleural effusions and/or chronic pleural thickening, stable compared to most recent chest x-ray of 06/25/2020. 2. Stable cardiomegaly. 3. No new lung findings. No evidence of pneumonia or pulmonary edema. Electronically Signed   By: Franki Cabot M.D.   On: 12/20/2020 14:09    Medications:    amLODipine  5 mg Oral Daily   aspirin  81 mg Oral Daily   carvedilol  3.125 mg Oral BID WC   cholecalciferol  5,000 Units Oral Daily   docusate sodium  100 mg Oral BID   ezetimibe  10 mg Oral QHS   heparin injection (subcutaneous)  5,000 Units Subcutaneous Q8H   levothyroxine  75 mcg Oral QHS   sodium chloride flush  3 mL Intravenous Q12H   vitamin B-12  1,000 mcg Oral Daily   Continuous Infusions:   LOS: 0 days   Geradine Girt  [facility]   How to contact the [facility] Attending or Consulting provider [place] or covering provider during after hours [place], for this patient?  Check the care team in [facility] and look for a) attending/consulting [facility] provider listed and b) the [facility] team listed Log into [email or web address] and use [place]'s universal password to access. If you do not have the password, please contact the hospital operator. Locate the [facility] provider you are looking for under [facility] and page to a number that you can be directly reached. If you still have difficulty reaching the provider, please page the [facility] (Director on Call) for the Hospitalists listed on amion for assistance.  12/21/2020, 2:09 PM

## 2020-12-21 NOTE — Evaluation (Signed)
Occupational Therapy Evaluation Patient Details Name: [name] MRN: [id number] DOB: 11-Mar-1931 Today's Date: 12/21/2020   History of Present Illness 87 yo female presenting to Ed on 12/4 with SOB. PMH including arthritis, dementia, cradycardia, cancer, CAD, CABG (2003), HTN, hip arthroplasty (2022), and lumbar lami (2013).   Clinical Impression   PTA, pt was living with her son and reports she was performing ADLs and light IADLs. Pt currently requires Supervision for ADLs and functional mobility. Pt presenting with decreased activity tolerance as seen by fatigue and needed for seated rest break during BADLs. SpO2 >90s on RA throughout. Pt would benefit from further acute OT to facilitate safe dc. Recommend dc to home with HHOT for further OT to optimize safety, independence with ADLs, and return to PLOF.      Recommendations for follow up therapy are one component of a multi-disciplinary discharge planning process, led by the attending physician.  Recommendations may be updated based on patient status, additional functional criteria and insurance authorization.   Follow Up Recommendations  Home health OT    Assistance Recommended at Discharge Intermittent Supervision/Assistance  Functional Status Assessment  Patient has had a recent decline in their functional status and demonstrates the ability to make significant improvements in function in a reasonable and predictable amount of time.  Equipment Recommendations  Tub/shower seat    Recommendations for Other Services PT consult     Precautions / Restrictions Precautions Precautions: Fall      Mobility Bed Mobility Overal bed mobility: Modified Independent             General bed mobility comments: increased time    Transfers Overall transfer level: Needs assistance Equipment used: None Transfers: Sit to/from Stand Sit to Stand: Supervision           General transfer comment: supervision for safety       Balance Overall balance assessment: Mild deficits observed, not formally tested                                         ADL either performed or assessed with clinical judgement   ADL Overall ADL's : Needs assistance/impaired Eating/Feeding: Set up;Sitting   Grooming: Wash/dry face;Wash/dry hands;Supervision/safety;Standing   Upper Body Bathing: Set up;Supervision/ safety;Sitting   Lower Body Bathing: Set up;Supervison/ safety;Sit to/from stand   Upper Body Dressing : Supervision/safety;Set up;Sitting   Lower Body Dressing: Set up;Supervision/safety;Sit to/from stand   Toilet Transfer: Supervision/safety;Ambulation (simulated to recliner)           Functional mobility during ADLs: Supervision/safety General ADL Comments: Pt performing ADLs and functional mobility at Supervision level. Pt performing grooming at sink and requiring rest break due to fatigue.     Vision Baseline Vision/History: 1 Wears glasses Patient Visual Report: No change from baseline       Perception     Praxis      Pertinent Vitals/Pain Pain Assessment: Faces Faces Pain Scale: No hurt Pain Intervention(s): Monitored during session     Hand Dominance Right   Extremity/Trunk Assessment Upper Extremity Assessment Upper Extremity Assessment: Generalized weakness   Lower Extremity Assessment Lower Extremity Assessment: Defer to PT evaluation   Cervical / Trunk Assessment Cervical / Trunk Assessment: Kyphotic   Communication Communication Communication: HOH   Cognition Arousal/Alertness: Awake/alert Behavior During Therapy: WFL for tasks assessed/performed Overall Cognitive Status: History of cognitive impairments - at baseline  General Comments: Baseline dementia per chart.     General Comments  SpO2 >91% on RA; notified RN    Exercises     Shoulder Instructions      Home Living Family/patient expects to be  discharged to:: Private residence Living Arrangements: Children ("son stays with me") Available Help at Discharge: Family;Available 24 hours/day Type of Home: House Home Access: Stairs to enter [employer] of Steps: 3 Entrance Stairs-Rails: None Home Layout: One level     Bathroom Shower/Tub: Tub/shower unit;Walk-in shower   Bathroom Toilet: Standard     Home Equipment: Cane - single point   Additional Comments: Pt unable to recall DME at home, stated she might have a cane from her husband.      Prior Functioning/Environment Prior Level of Function : Independent/Modified Independent             Mobility Comments: Pt reporting she does not use any DME for mobility ADLs Comments: Pt reports she performs ADLs and light IADLs. Simple meals. Son cleans and drives.        OT Problem List: Decreased activity tolerance;Impaired balance (sitting and/or standing);Decreased knowledge of use of DME or AE;Decreased knowledge of precautions;Decreased strength;Decreased range of motion      OT Treatment/Interventions: Self-care/ADL training;Therapeutic exercise;Energy conservation;DME and/or AE instruction;Therapeutic activities;Patient/family education    OT Goals(Current goals can be found in the care plan section) Acute Rehab OT Goals Patient Stated Goal: Go home OT Goal Formulation: With patient Time For Goal Achievement: 01/04/21 Potential to Achieve Goals: Good  OT Frequency: Min 2X/week   Barriers to D/C:            Co-evaluation              AM-PAC OT "6 Clicks" Daily Activity     Outcome Measure Help from another person eating meals?: None Help from another person taking care of personal grooming?: A Little Help from another person toileting, which includes using toliet, bedpan, or urinal?: A Little Help from another person bathing (including washing, rinsing, drying)?: A Little Help from another person to put on and taking off regular upper body  clothing?: A Little Help from another person to put on and taking off regular lower body clothing?: A Little 6 Click Score: 19   End of Session Nurse Communication: Mobility status  Activity Tolerance: Patient tolerated treatment well Patient left: in chair;with call bell/phone within reach;with chair alarm set  OT Visit Diagnosis: Other abnormalities of gait and mobility (R26.89);Unsteadiness on feet (R26.81);Muscle weakness (generalized) (M62.81)                Time: 4825-0037 OT Time Calculation (min): 25 min Charges:  OT General Charges $OT Visit: 1 Visit OT Evaluation $OT Eval Low Complexity: 1 Low OT Treatments $Self Care/Home Management : 8-22 mins  Christiann Hagerty MSOT, OTR/L Acute Rehab Pager: [phone] Office: [place] 12/21/2020, 8:35 AM

## 2020-12-21 NOTE — Evaluation (Signed)
Physical Therapy Evaluation Patient Details Name: [name] MRN: [id number] DOB: Nov 02, 1931 Today's Date: 12/21/2020  History of Present Illness  87 yo female presenting to Ed on 12/4 with SOB. PMH including arthritis, dementia, cradycardia, cancer, CAD, CABG (2003), HTN, hip arthroplasty (2022), and lumbar lami (2013).  Clinical Impression  Pt was seen for progressing mobility on RW in room with assistance and with cues for safety.  Pt was 98% O3 sat and HR 80 for entire walk in room in mult trips.  Her plan is to get home with family and has apparently mult family members who can coordinate her supervised care for HHPT to pick up rehab as well.  Recommend her to continue PT for safety and balance on LRAD, with instruction to family if available for education.  If family cannot supervise her for some reason, will have to reconsider HHPT and home discharge, but per chart history has the support.   PT to see for acute goals as are outlined below.       Recommendations for follow up therapy are one component of a multi-disciplinary discharge planning process, led by the attending physician.  Recommendations may be updated based on patient status, additional functional criteria and insurance authorization.  Follow Up Recommendations Home health PT    Assistance Recommended at Discharge Frequent or constant Supervision/Assistance  Functional Status Assessment Patient has had a recent decline in their functional status and demonstrates the ability to make significant improvements in function in a reasonable and predictable amount of time.  Equipment Recommendations  Rolling walker (2 wheels)    Recommendations for Other Services       Precautions / Restrictions Precautions Precautions: Fall Precaution Comments: monitor HR and sats Restrictions Weight Bearing Restrictions: No      Mobility  Bed Mobility Overal bed mobility: Needs Assistance Bed Mobility: Supine to Sit      Supine to sit: Supervision     General bed mobility comments: supervised for safety and lines    Transfers Overall transfer level: Needs assistance Equipment used: Rolling walker (2 wheels) Transfers: Sit to/from Stand Sit to Stand: Min guard           General transfer comment: min guard to stand and walk    Ambulation/Gait Ambulation/Gait assistance: Min guard;Min assist Gait Distance (Feet): 60 Feet Assistive device: Rolling walker (2 wheels);1 person hand held assist Gait Pattern/deviations: Step-through pattern;Decreased stride length;Staggering right;Wide base of support Gait velocity: reduced Gait velocity interpretation: <1.31 ft/sec, indicative of household ambulator Pre-gait activities: standing stepping in place and wgt shift General Gait Details: instability with lateral lean twice, but pt requires help to regain balance  Stairs            Wheelchair Mobility    Modified Rankin (Stroke Patients Only)       Balance Overall balance assessment: Needs assistance Sitting-balance support: Feet supported Sitting balance-Leahy Scale: Good     Standing balance support: Bilateral upper extremity supported;During functional activity Standing balance-Leahy Scale: Fair Standing balance comment: less than fair dynamically                             Pertinent Vitals/Pain Pain Assessment: No/denies pain Faces Pain Scale: No hurt Pain Intervention(s): Monitored during session    Home Living Family/patient expects to be discharged to:: Private residence Living Arrangements: Children Available Help at Discharge: Family;Available 24 hours/day Type of Home: House Home Access: Stairs to enter Entrance Stairs-Rails: None  Entrance Stairs-Number of Steps: 3   Home Layout: One level Home Equipment: Conservation officer, nature (2 wheels);Cane - single point Additional Comments: states she thinks she has a RW but may not have these things    Prior Function  Prior Level of Function : Independent/Modified Independent             Mobility Comments: no AD but lives in her home with her son there at night       Hand Dominance   Dominant Hand: Right    Extremity/Trunk Assessment   Upper Extremity Assessment Upper Extremity Assessment: Defer to OT evaluation    Lower Extremity Assessment Lower Extremity Assessment: Generalized weakness    Cervical / Trunk Assessment Cervical / Trunk Assessment: Kyphotic  Communication   Communication: HOH  Cognition Arousal/Alertness: Awake/alert Behavior During Therapy: WFL for tasks assessed/performed Overall Cognitive Status: History of cognitive impairments - at baseline                                 General Comments: cognitive changes which are evident in getting history        General Comments      Exercises     Assessment/Plan    PT Assessment Patient needs continued PT services  PT Problem List Decreased strength;Decreased range of motion;Decreased activity tolerance;Decreased balance;Decreased coordination;Decreased cognition;Decreased knowledge of use of DME;Decreased safety awareness;Cardiopulmonary status limiting activity       PT Treatment Interventions DME instruction;Gait training;Stair training;Functional mobility training;Therapeutic activities;Therapeutic exercise;Balance training;Neuromuscular re-education;Patient/family education    PT Goals (Current goals can be found in the Care Plan section)  Acute Rehab PT Goals Patient Stated Goal: to get home with family PT Goal Formulation: With patient Time For Goal Achievement: 01/04/21 Potential to Achieve Goals: Good    Frequency Min 3X/week   Barriers to discharge Inaccessible home environment stairs to enter home    Co-evaluation               AM-PAC PT "6 Clicks" Mobility  Outcome Measure Help needed turning from your back to your side while in a flat bed without using bedrails?: A  Little Help needed moving from lying on your back to sitting on the side of a flat bed without using bedrails?: A Little Help needed moving to and from a bed to a chair (including a wheelchair)?: A Little Help needed standing up from a chair using your arms (e.g., wheelchair or bedside chair)?: A Little Help needed to walk in hospital room?: A Little Help needed climbing 3-5 steps with a railing? : A Lot 6 Click Score: 17    End of Session Equipment Utilized During Treatment: Gait belt Activity Tolerance: Patient limited by fatigue;Treatment limited secondary to medical complications (Comment) Patient left: in chair;with call bell/phone within reach;with chair alarm set;with nursing/sitter in room Nurse Communication: Mobility status;Other (comment) (vitals with mobility) PT Visit Diagnosis: Unsteadiness on feet (R26.81);Muscle weakness (generalized) (M62.81);Difficulty in walking, not elsewhere classified (R26.2)    Time: 4098-1191 PT Time Calculation (min) (ACUTE ONLY): 32 min   Charges:   PT Evaluation $PT Eval Moderate Complexity: 1 Mod PT Treatments $Gait Training: 8-22 mins       Ramond Dial 12/21/2020, 5:21 PM  Mee Hives, PT PhD Acute Rehab Dept. Number: [place]

## 2020-12-21 NOTE — Progress Notes (Signed)
Mobility Specialist Progress Note    12/21/20 1625  Mobility  Activity Ambulated in hall  Level of Assistance Contact guard assist, steadying assist  Assistive Device Front wheel walker  Distance Ambulated (ft) 140 ft  Mobility Ambulated with assistance in hallway  Mobility Response Tolerated fair  Mobility performed by Mobility specialist  Bed Position Chair  $Mobility charge 1 Mobility   Pt received in chair and agreeable. C/o legs feeling weak from not getting up. Returned to chair with call bell in reach.  [facility] Mobility Specialist  M.S. Primary Phone: [phone] M.S. Secondary Phone: [phone]

## 2020-12-21 NOTE — Consult Note (Signed)
Consultation Note Date: 12/21/2020   Patient Name: [name]  DOB: [birth year]  MRN: [id number]  Age / Sex: 87 y.o., female  PCP: Lajean Manes, MD Referring Physician: Geradine Girt, DO  Reason for Consultation: Establishing goals of care  HPI/Patient Profile: 87 y.o. female  with past medical history of chronic diastolic CHF, severe aortic stenosis not a candidate for TAVR, stage 3 chronic kidney disease, CAD s/p CABG, dementia, HTN, HLD, and hypothyroidism who presented to the emergency department on 12/20/2020 with shortness of breath. In the ED, she appeared to be in volume overload. Chest x-ray was without pulmonary edema. Markedly elevated BNP. During her last hospitalization in June 2022 which was similar to this one, she improved with diuresis. Admitted to [facility] with acute on chronic diastolic CHF with severe AS.   Of note, if patient does not improve with diuresis, her symptoms are more likely related to the severe AS and she may be appropriate for transition to hospice.   Clinical Assessment and Goals of Care: I have reviewed medical records including EPIC notes, labs and imaging, and examined the patient at bedside. Patient reports her breathing is improved and that she "feels good".  She is currently ambulating with PT. The therapist is recommending HH/PT with 24 hour supervision for at least the first week.   I spoke with her son/[name] by phone to discuss diagnosis, prognosis, GOC, EOL wishes, disposition, and options. I introduced Palliative Medicine as specialized medical care for people living with serious illness. It focuses on providing relief from the symptoms and stress of a serious illness.   We discussed a brief life review of the patient. She is widowed. 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 She has 3 sons: [name], and  [name]. [name] shares that he is her POA and HCPOA. Patient lives in her home; son [name] has lived with her the past 10 years or so. When she is "not doing as well", [name] brings her to his house because his aunt (patient's sister) and is there to help as needed.   We discussed her current illness and what it means in the larger context of her ongoing co-morbidities. [name] verbalizes good understanding that patient has heart failure and severe non-operable AS. He understands that fluid overload is a recurrent issue with heart failure with AS.  We also reviewed that dementia is a progressive, non-curable disease underlying the patient's current acute medical conditions. We reviewed specific indicators of advanced dementia, including inability to communicate, bed bound/non-ambulatory status, decreased oral intake, and incontinence of bowel/bladder. As far as functional status at baseline, patient is ambulatory with a walker. She requires minimal assistance with ADLs.   I attempted to elicit values and goals of care important to the patient. I told [name] that during my visit, his mother told me she would not want to go to SNF, even for a few weeks. She also told me that she prefers not to come back  to the hospital.   The difference between full scope medical intervention and comfort care was considered in light of patient's goals of care. Provided education and counseling on the philosophy and benefits of hospice care. Discussed that it offers a holistic approach to care in the setting of end-stage illness/disease, and is about supporting the patient where they are while allowing a natural course to occur. Hospice can help a patient feel as good as possible for as long as possible. They can provide personal care, support for the family, and help keep patient out of the hospital. [name] is very receptive to the philosophy of hospice, but states that his brother (who lives with patient) thinks that hospice is akin to  eloquent to euthanasia.   While family is further considering and discussing hospice amongst themselves, I suggested an outpatient palliative referral to check-in periodically with patient and family and continue goals of care discussions. Discussed that outpatient palliative can also help with transition to hospice when patient's condition declines in the future. [name] is agreeable to outpatient palliative referral. He states his preference for [facility] [place].  Questions and concerns were addressed.  The family was encouraged to call with questions or concerns.   Primary decision maker: Son [name]    SUMMARY OF RECOMMENDATIONS   Continue current care Meeting with son/[name] at bedside tomorrow at 11am to review HCPOA documents Outpatient palliative referral - [facility] Planning: DNR  Additional Recommendations (Limitations, Scope, Preferences): Avoid Hospitalization  Prognosis:  < 6 months would not be surprising  Discharge Planning: Home with HH/PT and outpatient palliative     Primary Diagnoses: Present on Admission:  Acute on chronic diastolic CHF (congestive heart failure) ([place])  Essential hypertension, benign  Pure hypercholesterolemia  Dementia without behavioral disturbance ([place])  DNR (do not resuscitate)   I have reviewed the medical record, interviewed the patient and family, and examined the patient. The following aspects are pertinent.  Past Medical History:  Diagnosis Date   Anemia    Aortic stenosis    mild AS by 02/18/11  [name])   Arthritis    Bradycardia    Asymtomatic resolved   Cancer (HCC)    ceervical   Chronic renal disease, stage III (HCC)    Coronary artery disease    CABG 2003   Dyslipidemia    goal LSL less then 70 but statin intolerant   GERD (gastroesophageal reflux disease)    Hypertension    Hypothyroidism    Iron deficiency anemia 07/18/2010   Mild AI (aortic insufficiency)     Mild aortic stenosis    OP (osteoporosis) 10/18/2010   fosamax   Statin intolerance      Family History  Problem Relation Age of Onset   Heart Problems Mother    Asthma Father    Scheduled Meds:  amLODipine  5 mg Oral Daily   aspirin  81 mg Oral Daily   carvedilol  3.125 mg Oral BID WC   cholecalciferol  5,000 Units Oral Daily   docusate sodium  100 mg Oral BID   ezetimibe  10 mg Oral QHS   heparin injection (subcutaneous)  5,000 Units Subcutaneous Q8H   levothyroxine  75 mcg Oral QHS   sodium chloride flush  3 mL Intravenous Q12H   vitamin B-12  1,000 mcg Oral Daily   Continuous Infusions: PRN Meds:.acetaminophen **OR** acetaminophen, bisacodyl, hydrALAZINE, HYDROcodone-acetaminophen, ondansetron **OR** ondansetron (ZOFRAN) IV, polyethylene glycol, traZODone   Allergies  Allergen Reactions   Aleve [Naproxen Sodium] Hives   Aleve [Naproxen]     Other reaction(s): Unknown   Crestor [Rosuvastatin]     Other reaction(s): lethargy   Excedrin Extra Strength [Aspirin-Acetaminophen-Caffeine] Nausea And Vomiting   Meloxicam     Other reaction(s): felt poorly   Statins Other (See Comments)    No energy, very weak   Vytorin [Ezetimibe-Simvastatin] Other (See Comments)    Fatigue    Review of Systems  Physical Exam Vitals reviewed.  Constitutional:      General: She is not in acute distress.    Comments: Frail and chronically ill-appearing  Pulmonary:     Effort: Pulmonary effort is normal.  Neurological:     Mental Status: She is alert.  Psychiatric:        Cognition and Memory: Cognition is impaired.    Vital Signs: BP 122/71   Pulse 60   Temp 97.9 F (36.6 C)   Resp 16   Ht 5' (1.524 m)   Wt 40 kg   SpO2 100%   BMI 17.22 kg/m  Pain Scale: 0-10   Pain Score: 0-No pain   SpO2: SpO2: 100 % O2 Device:SpO2: 100 % O2 Flow Rate: .   IO: Intake/output summary:  Intake/Output Summary (Last 24 hours) at 12/21/2020 1438 Last data filed at 12/21/2020  1300 Gross per 24 hour  Intake 1080 ml  Output 1200 ml  Net -120 ml    LBM: Last BM Date: 12/18/20 Baseline Weight: Weight: 40.3 kg Most recent weight: Weight: 40 kg      Palliative Assessment/Data: PPS 50%     Time In: 1420 Time Out: 1530 Time Total: 70 minutes Greater than 50%  of this time was spent counseling and coordinating care related to the above assessment and plan.  Signed by: Lavena Bullion, NP   Please contact Palliative Medicine Team phone at [phone] for questions and concerns.  For individual provider: See Shea Evans

## 2020-12-22 LAB — BASIC METABOLIC PANEL
Anion gap: 8 (ref 5–15)
BUN: 45 mg/dL — ABNORMAL HIGH (ref 8–23)
CO2: 25 mmol/L (ref 22–32)
Calcium: 8.9 mg/dL (ref 8.9–10.3)
Chloride: 102 mmol/L (ref 98–111)
Creatinine, Ser: 1.59 mg/dL — ABNORMAL HIGH (ref 0.44–1.00)
GFR, Estimated: 31 mL/min — ABNORMAL LOW (ref >60)
Glucose, Bld: 93 mg/dL (ref 70–99)
Potassium: 3.3 mmol/L — ABNORMAL LOW (ref 3.5–5.1)
Sodium: 135 mmol/L (ref 135–145)

## 2020-12-22 LAB — GLUCOSE, CAPILLARY: Glucose-Capillary: 105 mg/dL — ABNORMAL HIGH (ref 70–99)

## 2020-12-22 MED ORDER — AMLODIPINE BESYLATE 2.5 MG PO TABS
2.5000 mg | ORAL_TABLET | Freq: Every day | ORAL | Status: DC
  Administered 2020-12-23: 2.5 mg via ORAL
  Filled 2020-12-22: qty 1

## 2020-12-22 MED ORDER — AMLODIPINE BESYLATE 5 MG PO TABS
5.0000 mg | ORAL_TABLET | Freq: Every day | ORAL | Status: DC
  Administered 2020-12-22: 5 mg via ORAL
  Filled 2020-12-22: qty 1

## 2020-12-22 MED ORDER — POTASSIUM CHLORIDE CRYS ER 20 MEQ PO TBCR
40.0000 meq | EXTENDED_RELEASE_TABLET | Freq: Once | ORAL | Status: AC
  Administered 2020-12-22: 40 meq via ORAL
  Filled 2020-12-22: qty 2

## 2020-12-22 MED ORDER — CARVEDILOL 6.25 MG PO TABS
6.2500 mg | ORAL_TABLET | Freq: Two times a day (BID) | ORAL | Status: DC
  Administered 2020-12-22 – 2020-12-23 (×2): 6.25 mg via ORAL
  Filled 2020-12-22 (×2): qty 1

## 2020-12-22 NOTE — Progress Notes (Signed)
Mobility Specialist Progress Note    12/22/20 1334  Mobility  Activity Ambulated in hall  Level of Assistance Contact guard assist, steadying assist  Assistive Device Front wheel walker  Distance Ambulated (ft) 140 ft  Mobility Ambulated with assistance in hallway  Mobility Response Tolerated fair  Mobility performed by Mobility specialist  $Mobility charge 1 Mobility   Pt received in bed and agreeable. C/o legs feeling weak. Returned to bed with call bell in reach.   [facility] Mobility Specialist  M.S. Primary Phone: [phone] M.S. Secondary Phone: [phone]

## 2020-12-22 NOTE — Progress Notes (Signed)
Progress Note    [name]  LZJ:[id number] DOB: Feb 08, 1931  DOA: 12/20/2020 PCP: Lajean Manes, MD    Brief Narrative:    Medical records reviewed and are as summarized below:  [name] is an 87 y.o. female with medical history significant of cervical CA; stage 3 CKD; non-operable severe AS; dementia; CAD s/p CABG; HTN; HLD; and hypothyroidism presenting with SOB.  She reports that she has been having difficulty breathing.  She is not sure how long this has been going on, maybe a week.  Palliative care in talks with the family.   Assessment/Plan:   Principal Problem:   Acute on chronic diastolic CHF (congestive heart failure) (HCC) Active Problems:   Essential hypertension, benign   Pure hypercholesterolemia   Aortic stenosis   Dementia without behavioral disturbance (HCC)   DNR (do not resuscitate)   CHF (congestive heart failure) (HCC)   Protein-calorie malnutrition, severe   Acute on chronic diastolic CHF with severe AS -Patient with known h/o chronic diastolic CHF presenting with worsening SOB, no hypoxia -She also has severe but non-operable AS per cardiology -While in the ER there was intermittent concern for new onset afib- does appear to be irregular but seems to be sinus -CXR without pulmonary edema -Markedly elevated BNP, similar to prior -During her last hospitalization, which was similar to this one, she improved with diuresis -Was given Lasix 40 mg x 1 in ER and again on the floor - palliative care consult appreciated    Hypokalemia -replete  HTN -Continue Norvasc, Coreg (increase back up and monitor on tele) -Will also add prn hydralazine   HLD -Continue Zetia, as she is statin intolerant   CAD -s/p CABG -Continue ASA   Dementia -She is not taking medications for this issue -delirium precautions   Stage 3a CKD -Appears to be stable at this time   Hypothyroidism -Continue Synthroid   DNR -palliative care consult-  comfort care appropriate    Family Communication/Anticipated D/C date and plan/Code Status   DVT prophylaxis: heparin Code Status: dnr Family Communication: son not at bedside- defer to palliative care talks Disposition Plan: Status is: [place] Consultants:   Palliative care  Subjective:   Denies SOB  Objective:    Vitals:   12/21/20 1832 12/21/20 2005 12/22/20 0400 12/22/20 1127  BP: (!) 99/58 106/89 109/71 (!) 156/66  Pulse: (!) 50 60 (!) 59 84  Resp: (!) 22 16 15  (!) 23  Temp:  97.8 F (36.6 C) 97.6 F (36.4 C) 98.6 F (37 C)  TempSrc:  Oral Oral Oral  SpO2: 94% 100% 100% 97%  Weight:   40.8 kg   Height:        Intake/Output Summary (Last 24 hours) at 12/22/2020 1253 Last data filed at 12/22/2020 0947 Gross per 24 hour  Intake 480 ml  Output 950 ml  Net -470 ml   Filed Weights   12/20/20 1907 12/21/20 0433 12/22/20 0400  Weight: 40.3 kg 40 kg 40.8 kg    Exam:  General: Appearance:    Thin female in no acute distress     Lungs:      respirations unlabored  Heart:    Normal heart rate.   MS:   All extremities are intact.    Neurologic:   Awake, alert, pleasantly confused     Data Reviewed:   I have personally reviewed following labs and imaging studies:  Labs: Labs show  the following:   Basic Metabolic Panel: Recent Labs  Lab 12/20/20 1301 12/21/20 0239 12/22/20 0236  NA 142 140 135  K 3.7 3.5 3.3*  CL 112* 107 102  CO2 21* 25 25  GLUCOSE 120* 107* 93  BUN 50* 50* 45*  CREATININE 1.64* 2.03* 1.59*  CALCIUM 9.8 9.5 8.9   GFR Estimated Creatinine Clearance: 16.1 mL/min (A) (by C-G formula based on SCr of 1.59 mg/dL (H)). Liver Function Tests: No results for input(s): AST, ALT, ALKPHOS, BILITOT, PROT, ALBUMIN in the last 168 hours. No results for input(s): LIPASE, AMYLASE in the last 168 hours. No results for input(s): AMMONIA in the last 168 hours. Coagulation profile No results for input(s): INR, PROTIME in the  last 168 hours.  CBC: Recent Labs  Lab 12/20/20 1301 12/21/20 0239  WBC 7.3 7.4  NEUTROABS 5.2  --   HGB 12.2 11.3*  HCT 37.7 34.4*  MCV 98.7 95.3  PLT 191 180   Cardiac Enzymes: No results for input(s): CKTOTAL, CKMB, CKMBINDEX, TROPONINI in the last 168 hours. BNP (last 3 results) No results for input(s): PROBNP in the last 8760 hours. CBG: No results for input(s): GLUCAP in the last 168 hours. D-Dimer: No results for input(s): DDIMER in the last 72 hours. Hgb A1c: No results for input(s): HGBA1C in the last 72 hours. Lipid Profile: No results for input(s): CHOL, HDL, LDLCALC, TRIG, CHOLHDL, LDLDIRECT in the last 72 hours. Thyroid function studies: No results for input(s): TSH, T4TOTAL, T3FREE, THYROIDAB in the last 72 hours.  Invalid input(s): FREET3 Anemia work up: No results for input(s): VITAMINB12, FOLATE, FERRITIN, TIBC, IRON, RETICCTPCT in the last 72 hours. Sepsis Labs: Recent Labs  Lab 12/20/20 1301 12/21/20 0239  WBC 7.3 7.4    Microbiology Recent Results (from the past 240 hour(s))  Resp Panel by RT-PCR (Flu A&B, Covid) Nasopharyngeal Swab     Status: None   Collection Time: 12/20/20  1:47 PM   Specimen: Nasopharyngeal Swab; Nasopharyngeal(NP) swabs in vial transport medium  Result Value Ref Range Status   SARS Coronavirus 2 by RT PCR NEGATIVE NEGATIVE Final    Comment: (NOTE) SARS-CoV-2 target nucleic acids are NOT DETECTED.  The SARS-CoV-2 RNA is generally detectable in upper respiratory specimens during the acute phase of infection. The lowest concentration of SARS-CoV-2 viral copies this assay can detect is 138 copies/mL. A negative result does not preclude SARS-Cov-2 infection and should not be used as the sole basis for treatment or other patient management decisions. A negative result may occur with  improper specimen collection/handling, submission of specimen other than nasopharyngeal swab, presence of viral mutation(s) within the areas  targeted by this assay, and inadequate number of viral copies(<138 copies/mL). A negative result must be combined with clinical observations, patient history, and epidemiological information. The expected result is Negative.  Fact Sheet for Patients:  EntrepreneurPulse.com.au  Fact Sheet for Healthcare Providers:  IncredibleEmployment.be  This test is no t yet approved or cleared by the [place] FDA and  has been authorized for detection and/or diagnosis of SARS-CoV-2 by FDA under an Emergency Use Authorization (EUA). This EUA will remain  in effect (meaning this test can be used) for the duration of the COVID-19 declaration under Section 564(b)(1) of the Act, 21 U.S.C.section 360bbb-3(b)(1), unless the authorization is terminated  or revoked sooner.       Influenza A by PCR NEGATIVE NEGATIVE Final   Influenza B by PCR NEGATIVE NEGATIVE Final    Comment: (NOTE) The Xpert  Xpress SARS-CoV-2/FLU/RSV plus assay is intended as an aid in the diagnosis of influenza from Nasopharyngeal swab specimens and should not be used as a sole basis for treatment. Nasal washings and aspirates are unacceptable for Xpert Xpress SARS-CoV-2/FLU/RSV testing.  Fact Sheet for Patients: EntrepreneurPulse.com.au  Fact Sheet for Healthcare Providers: IncredibleEmployment.be  This test is not yet approved or cleared by the [place] FDA and has been authorized for detection and/or diagnosis of SARS-CoV-2 by FDA under an Emergency Use Authorization (EUA). This EUA will remain in effect (meaning this test can be used) for the duration of the COVID-19 declaration under Section 564(b)(1) of the Act, 21 U.S.C. section 360bbb-3(b)(1), unless the authorization is terminated or revoked.  Performed at [facility], [place] [address], [place], [place] [address]     Procedures and diagnostic studies:  DG Chest 2  View  Result Date: 12/20/2020 CLINICAL DATA:  Shortness of breath for 5 days EXAM: CHEST - 2 VIEW COMPARISON:  Chest x-ray dated 06/25/2020. Chest x-ray dated 03/22/2020. FINDINGS: Small bibasilar pleural effusions and/or chronic pleural thickening, similar to appearance on chest x-ray of 06/25/2020 but new compared to the earlier chest x-ray of 03/22/2020. No new lung findings. No pneumothorax is seen. Stable cardiomegaly. Median sternotomy wires appear intact and stable in alignment. No acute-appearing osseous abnormality. IMPRESSION: 1. Small bibasilar pleural effusions and/or chronic pleural thickening, stable compared to most recent chest x-ray of 06/25/2020. 2. Stable cardiomegaly. 3. No new lung findings. No evidence of pneumonia or pulmonary edema. Electronically Signed   By: Franki Cabot M.D.   On: 12/20/2020 14:09    Medications:    aspirin  81 mg Oral Daily   carvedilol  3.125 mg Oral BID WC   cholecalciferol  5,000 Units Oral Daily   docusate sodium  100 mg Oral BID   ezetimibe  10 mg Oral QHS   feeding supplement  237 mL Oral TID BM   heparin injection (subcutaneous)  5,000 Units Subcutaneous Q8H   levothyroxine  75 mcg Oral QHS   multivitamin with minerals  1 tablet Oral Daily   sodium chloride flush  3 mL Intravenous Q12H   vitamin B-12  1,000 mcg Oral Daily   Continuous Infusions:   LOS: 1 day   Geradine Girt  [facility]   How to contact the [facility] Attending or Consulting provider [place] or covering provider during after hours [place], for this patient?  Check the care team in [facility] and look for a) attending/consulting TRH provider listed and b) the [facility] team listed Log into [email or web address] and use [place]'s universal password to access. If you do not have the password, please contact the hospital operator. Locate the [facility] provider you are looking for under [facility] and page to a number that you can be directly reached. If you still have difficulty  reaching the provider, please page the [facility] (Director on Call) for the Hospitalists listed on amion for assistance.  12/22/2020, 12:53 PM

## 2020-12-22 NOTE — Progress Notes (Addendum)
Heart Failure Navigator Progress Note  Assessed for Heart & Vascular TOC clinic readiness.  Patient does not meet criteria due to severe AS per last ECHO. Per note: pt NOT surgical candidate for valve repair. Pt has some confusion and baseline dementia.  Navigator available for reassessment of patient.   Pricilla Holm, MSN, RN Heart Failure Nurse Navigator [phone]

## 2020-12-22 NOTE — Progress Notes (Signed)
Daily Progress Note   Patient Name: [name]       Date: 12/22/2020 DOB: [birth year]  Age: 87 y.o. MRN#: [id number] Attending Physician: Geradine Girt, DO Primary Care Physician: Lajean Manes, MD Admit Date: 12/20/2020    HPI/Patient Profile: 87 y.o. female  with past medical history of chronic diastolic CHF, severe aortic stenosis not a candidate for TAVR, stage 3 chronic kidney disease, CAD s/p CABG, dementia, HTN, HLD, and hypothyroidism who presented to the emergency department on 12/20/2020 with shortness of breath. In the ED, she appeared to be in volume overload. Chest x-ray was without pulmonary edema. Markedly elevated BNP. During her last hospitalization in June 2022 which was similar to this one, she improved with diuresis. Admitted to [facility] with acute on chronic diastolic CHF with severe AS.   Subjective: I met at bedside with patient and her son/[name]. He reports that his mother completed an document in the past naming him as HCPOA, but he has been unable to find it. I offer to assist with completion of a new document, and they are agreeable. I provided education on the advanced directives packet in detail. Although patient has dementia, she is oriented to person, place, and situation. She is able to clearly state that she would want her son [name]  to make healthcare decisions for her in the event she is unable to make them for herself. She also names her son/[name] as alternate (secondary) HCPOA.   Patient/son decline to complete living will at this time. However, patient does state she would not want artificial feeding in the event of an incurable illness, unconsciousness, or advanced cognitive impairment.   [name] confirms that he is agreeable to outpatient palliative care  referral with [facility]     Length of Stay: 1   Objective:  Physical Exam Vitals reviewed.  Constitutional:      General: She is not in acute distress.    Comments: Frail and chronically ill-appearing  Pulmonary:     Effort: Pulmonary effort is normal.  Neurological:     Mental Status: She is alert.     Comments: Oriented to person, place, situation  Psychiatric:        Cognition and Memory: Cognition is impaired.  Vital Signs: BP 106/67 (BP Location: Right Arm)   Pulse 81   Temp (!) 97.5 F (36.4 C) (Oral)   Resp 17   Ht 5' (1.524 m)   Wt 40.8 kg   SpO2 96%   BMI 17.57 kg/m  SpO2: SpO2: 96 % O2 Device: O2 Device: Nasal Cannula O2 Flow Rate:    Intake/output summary:  Intake/Output Summary (Last 24 hours) at 12/22/2020 1937 Last data filed at 12/22/2020 1400 Gross per 24 hour  Intake 480 ml  Output 950 ml  Net -470 ml   LBM: Last BM Date: 12/18/20 Baseline Weight: Weight: 40.3 kg Most recent weight: Weight: 40.8 kg       Palliative Assessment/Data: PPS 50%      Palliative Care Assessment & Plan   Assessment: - acute on chronic diastolic CHF - severe AS, non-operable - hypokalemia - dementia - protein-calorie malnutrition  Recommendations/Plan: Continue current care HCPOA document filled out but needs to be notarized - spiritual care consulted for assistance Goal of care is to return home - patient will stay with son/[name] for at least the first week Outpatient palliative referral - [facility]  Goals of Care and Additional Recommendations: Limitations on Scope of Treatment: No Artificial Feeding  Code Status:  Prognosis: Unable to determine, but < 6 months would not be surprising  Discharge Planning: Home with HH/PT and outpatient palliative    Thank you for allowing the Palliative Medicine Team to assist in the care of this patient.   Total Time 35 minutes  Prolonged Time Billed  no       Greater than 50%  of this time was spent counseling and coordinating care related to the above assessment and plan.  Lavena Bullion, NP  Please contact Palliative Medicine Team phone at [phone] for questions and concerns.

## 2020-12-22 NOTE — TOC Initial Note (Signed)
Transition of Care [facility]) - Initial/Assessment Note    Patient Details  Name: [name] MRN: [id number] Date of Birth: 03/24/1931  Transition of Care [facility]) CM/SW Contact:    Bethena Roys, RN Phone Number: 12/22/2020, 4:33 PM  Clinical Narrative: Case Manager spoke with patient and son was at the bedside during the conversation. Patient is agreeable to home health services- son and patient did not have a preference. Case Manager called [facility] and [facility]- they could not accept the patient. Case Manager then called [facility] and they can service the patient in [place]. Start of care to begin within 24-48 hours post transition home. Patient has rolling walkers at home and is in need of a tub transfer bench- agreeable to have Case Manager send order to [facility]. Equipment will be delivered to the room prior to transition home. Case Manager received an outpatient palliative referral- Referral submitted to [facility]- family appreciative of the referral. Son will transport patient home via private vehicle. Case Manager will follow for orders PT/OT and F2F.                 Expected Discharge Plan: [place] Barriers to Discharge: Continued Medical Work up   Patient Goals and CMS Choice Patient states their goals for this hospitalization and ongoing recovery are:: to return home with home health services.   Choice offered to / list presented to : NA (Patient and son did not have a preference.)  Expected Discharge Plan and Services Expected Discharge Plan: [place] In-house Referral: [facility] [place] (Arranged outpatient palliative with [facility].) Discharge Planning Services: CM Consult Post Acute Care Choice: [place] arrangements for the past 2 months: Single Family Home                 DME Arranged: Tub bench DME Agency: [facility] Date DME Agency Contacted: 12/22/20 Time  DME Agency Contacted: 68 Representative spoke with at DME Agency: Freda Munro HH Arranged: PT, OT HH Agency: [facility] Date [place]: 12/22/20 Time HH Agency Contacted: 4 Representative spoke with at [place]: [place]  Prior Living Arrangements/Services Living arrangements for the past 2 months: [place] with:: Self, Adult Children (Son states patient is staying with him now and returns home frequently.) Patient language and need for interpreter reviewed:: Yes Do you feel safe going back to the place where you live?: Yes      Need for Family Participation in Patient Care: Yes (Comment) Care giver support system in place?: Yes (comment) Current home services: DME (Patient has several rolling walkers.) Criminal Activity/Legal Involvement Pertinent to Current Situation/Hospitalization: No - Comment as needed  Activities of Daily Living      Permission Sought/Granted Permission sought to share information with : Family Supports, Customer service manager, Case Optician, dispensing granted to share information with : Yes, Verbal Permission Granted     Permission granted to share info w AGENCY: [facility],, [facility]        Emotional Assessment Appearance:: Appears stated age Attitude/Demeanor/Rapport: Engaged Affect (typically observed): Appropriate Orientation: : Oriented to Situation, Oriented to  Time, Oriented to Place, Oriented to Self Alcohol / Substance Use: Not Applicable Psych Involvement: No (comment)  Admission diagnosis:  Pleural effusion [J90] Acute on chronic diastolic CHF (congestive heart failure) (HCC) [I50.33] Dyspnea, unspecified type [R06.00] CHF (congestive heart failure) ([place]) [I50.9] Patient Active Problem List  Diagnosis Date Noted   CHF (congestive heart failure) ([place]) 12/21/2020   Protein-calorie malnutrition, severe 12/21/2020   Acute on chronic diastolic CHF (congestive heart failure) ([place]) 12/20/2020    Dementia without behavioral disturbance ([place]) 12/20/2020   DNR (do not resuscitate) 12/20/2020   Acute on chronic diastolic (congestive) heart failure ([place]) 06/25/2020   Acute CHF (congestive heart failure) ([place]) 06/25/2020   Fall    Left displaced femoral neck fracture ([place]) 03/22/2020   Renal insufficiency 03/22/2020   Coronary atherosclerosis of native coronary artery 04/23/2013   Essential hypertension, benign 04/23/2013   Pure hypercholesterolemia 04/23/2013   Aortic stenosis    Spinal stenosis, lumbar 09/23/2011    Class: Diagnosis of   PCP:  Lajean Manes, MD Pharmacy:   [facility] ORDER) [place], [place] [id number] Phone: [phone] Fax: [phone]  [id number] - [place], [place] - [address] [facility] [place] [address] Phone: [phone] Fax: [phone]     Social Determinants of Health (SDOH) Interventions    Readmission Risk Interventions No flowsheet data found.

## 2020-12-23 DIAGNOSIS — E43 Unspecified severe protein-calorie malnutrition: Secondary | ICD-10-CM

## 2020-12-23 DIAGNOSIS — I10 Essential (primary) hypertension: Secondary | ICD-10-CM

## 2020-12-23 DIAGNOSIS — E7800 Pure hypercholesterolemia, unspecified: Secondary | ICD-10-CM

## 2020-12-23 LAB — BASIC METABOLIC PANEL
Anion gap: 10 (ref 5–15)
BUN: 41 mg/dL — ABNORMAL HIGH (ref 8–23)
CO2: 23 mmol/L (ref 22–32)
Calcium: 9 mg/dL (ref 8.9–10.3)
Chloride: 101 mmol/L (ref 98–111)
Creatinine, Ser: 1.32 mg/dL — ABNORMAL HIGH (ref 0.44–1.00)
GFR, Estimated: 39 mL/min — ABNORMAL LOW (ref >60)
Glucose, Bld: 90 mg/dL (ref 70–99)
Potassium: 4.6 mmol/L (ref 3.5–5.1)
Sodium: 134 mmol/L — ABNORMAL LOW (ref 135–145)

## 2020-12-23 MED ORDER — AMLODIPINE BESYLATE 5 MG PO TABS: 2.5000 mg | ORAL_TABLET | Freq: Every day | ORAL | Status: DC

## 2020-12-23 NOTE — Progress Notes (Signed)
OT Cancellation Note  Patient Details Name: [name] MRN: [id number] DOB: [birth year]   Cancelled Treatment:    Reason Eval/Treat Not Completed: Patient at procedure or test/ unavailable Patient with chaplain and needing to complete advanced directive paperwork. Will follow up if time permits.   Corinne [place]s E. [place], [facility] [place] 12/23/2020, 10:44 AM

## 2020-12-23 NOTE — Discharge Summary (Signed)
Physician Discharge Summary  [name] XYI:[id number] DOB: Mar 27, 1931 DOA: 12/20/2020  PCP: Lajean Manes, MD  Admit date: 12/20/2020 Discharge date: 12/23/2020  Admitted From: Home Disposition: Home   Recommendations for Outpatient Follow-up:  Follow up with PCP in 1-2 weeks  Home Health: PT, OT, palliative care Equipment/Devices: Tub bench Discharge Condition: Stable CODE STATUS: DNR Diet recommendation: Heart healthy  Brief/Interim Summary: [name] is an 87 y.o. female with medical history significant of cervical CA; stage 3 CKD; non-operable severe AS; dementia; CAD s/p CABG; HTN; HLD; and hypothyroidism presenting with SOB.  She reports that she has been having difficulty breathing.  She is not sure how long this has been going on, maybe a week. She was given lasix for concern of acute on chronic CHF with severe aortic stenosis for which she is not a candidate for intervention. Palliative care in talks with the family and will continue to follow after discharge.  Discharge Diagnoses:  Principal Problem:   Acute on chronic diastolic CHF (congestive heart failure) (HCC) Active Problems:   Essential hypertension, benign   Pure hypercholesterolemia   Aortic stenosis   Dementia without behavioral disturbance (HCC)   DNR (do not resuscitate)   CHF (congestive heart failure) (HCC)   Protein-calorie malnutrition, severe  Acute on chronic diastolic CHF with severe AS -Patient with known h/o chronic diastolic CHF presenting with worsening SOB, no hypoxia -She also has severe but non-operable AS per cardiology -While in the ER there was intermittent concern for new onset afib- does appear to be irregular but seems to be sinus -CXR without pulmonary edema. Markedly elevated BNP, similar to prior -During her last hospitalization, which was similar to this one, she improved with diuresis. Was given Lasix 40 mg x 1 in ER and again on the floor with improvement - palliative  care consult appreciated    Hypokalemia -repleted   HTN -Continue Norvasc, though decrease dose due to soft blood pressure, continue coreg    HLD -Continue Zetia, as she is statin intolerant   CAD s/p CABG -Continue ASA   Dementia -She is not taking medications for this issue -delirium precautions   Stage 3a CKD -Appears to be stable at this time   Hypothyroidism -Continue Synthroid   Severe protein calorie malnutrition: Supplement as able.  DNR -palliative care consult- comfort care appropriate   Discharge Instructions Discharge Instructions     (HEART FAILURE PATIENTS) Call MD:  Anytime you have any of the following symptoms: 1) 3 pound weight gain in 24 hours or 5 pounds in 1 week 2) shortness of breath, with or without a dry hacking cough 3) swelling in the hands, feet or stomach 4) if you have to sleep on extra pillows at night in order to breathe.   Complete by: As directed    Diet - low sodium heart healthy   Complete by: As directed    Discharge instructions   Complete by: As directed    You were admitted for shortness of breath which responded to lasix. You do not require supplemental oxygen at discharge, but will benefit from therapy at home (arranged) and palliative care following you in your home as well. Continue taking medications as you were including lasix as needed for fluid retention or weight gain, with the exception of norvasc. Instead of 5mg  tablet once per day, take only 2.5mg  (1/2 tablet) once a day. Follow up with your cardiologist and/or Dr. Felipa Eth in the next 1-2 weeks or seek medical attention  right away if your symptoms worsen/return.   Increase activity slowly   Complete by: As directed       Allergies as of 12/23/2020       Reactions   Aleve [naproxen Sodium] Hives   Aleve [naproxen]    Other reaction(s): Unknown   Crestor [rosuvastatin]    Other reaction(s): lethargy   Excedrin Extra Strength [aspirin-acetaminophen-caffeine] Nausea  And Vomiting   Meloxicam    Other reaction(s): felt poorly   Statins Other (See Comments)   No energy, very weak   Vytorin [ezetimibe-simvastatin] Other (See Comments)   Fatigue        Medication List     TAKE these medications    acetaminophen 500 MG tablet Commonly known as: TYLENOL Take 1,000 mg by mouth every 6 (six) hours as needed for mild pain.   amLODipine 5 MG tablet Commonly known as: NORVASC Take 0.5 tablets (2.5 mg total) by mouth daily. What changed: how much to take   aspirin 81 MG chewable tablet Chew 81 mg by mouth daily.   carvedilol 6.25 MG tablet Commonly known as: COREG Take 1 tablet (6.25 mg total) by mouth 2 (two) times daily with a meal.   ferrous sulfate 325 (65 FE) MG tablet Take 325 mg by mouth at bedtime.   furosemide 20 MG tablet Commonly known as: LASIX Take 20 mg by mouth daily as needed for fluid.   levothyroxine 75 MCG tablet Commonly known as: SYNTHROID Take 75 mcg by mouth at bedtime.   multivitamin with minerals Tabs tablet Take 1 tablet by mouth daily.   vitamin B-12 500 MCG tablet Commonly known as: CYANOCOBALAMIN Take 1,000 mcg by mouth daily.   Vitamin D-3 125 MCG (5000 UT) Tabs Take 5,000 Units by mouth daily.               Durable Medical Equipment  (From admission, onward)           Start     Ordered   12/22/20 1200  For home use only DME Tub bench  Once       Comments: Tub transfer bench   12/22/20 1159            Follow-up Information     [facility] Follow up.   Why: Outpatient Palliative- to consult once the patient gets home. Office to arrange visits times. Contact information: [place] [phone] [facility] Follow up.   Why: Tub Transfer bench- to e delivered to the room prior to d/c. Contact information: [place] [address] [phone]         [facility] Follow up.   Specialty: Home Health Services Why: Physical Therapy-Occupational Therapy-office to call with visit times. Contact information: [place] [address] [phone]         [name], MD .   Specialty: Cardiology Contact information: [phone] [place] [address] [phone]         Lajean Manes, MD Follow up.   Specialty: Internal Medicine Contact information: [address] [place] [address] [phone]                Allergies  Allergen Reactions   Aleve [Naproxen Sodium] Hives   Aleve [Naproxen]     Other reaction(s): Unknown   Crestor [Rosuvastatin]  Other reaction(s): lethargy   Excedrin Extra Strength [Aspirin-Acetaminophen-Caffeine] Nausea And Vomiting   Meloxicam     Other reaction(s): felt poorly   Statins Other (See Comments)    No energy, very weak   Vytorin [Ezetimibe-Simvastatin] Other (See Comments)    Fatigue     Consultations: Palliative care  Procedures/Studies: DG Chest 2 View  Result Date: 12/20/2020 CLINICAL DATA:  Shortness of breath for 5 days EXAM: CHEST - 2 VIEW COMPARISON:  Chest x-ray dated 06/25/2020. Chest x-ray dated 03/22/2020. FINDINGS: Small bibasilar pleural effusions and/or chronic pleural thickening, similar to appearance on chest x-ray of 06/25/2020 but new compared to the earlier chest x-ray of 03/22/2020. No new lung findings. No pneumothorax is seen. Stable cardiomegaly. Median sternotomy wires appear intact and stable in alignment. No acute-appearing osseous abnormality. IMPRESSION: 1. Small bibasilar pleural effusions and/or chronic pleural thickening, stable compared to most recent chest x-ray of 06/25/2020. 2. Stable cardiomegaly. 3. No new lung findings. No evidence of pneumonia or pulmonary edema. Electronically Signed   By: Franki Cabot M.D.   On: 12/20/2020 14:09     Subjective: No  complaints.  Discharge Exam: Vitals:   12/22/20 2010 12/23/20 0455  BP: (!) 89/51 (!) 92/55  Pulse: 69 61  Resp: 18 20  Temp: (!) 97.2 F (36.2 C) 97.6 F (36.4 C)  SpO2: 93% 100%   General: Pt is alert, awake, not in acute distress Cardiovascular: RRR, S1/S2 +, no rubs, no gallops Respiratory: CTA bilaterally, no wheezing, no rhonchi Abdominal: Soft, NT, ND, bowel sounds + Extremities: No pitting edema, no cyanosis  Labs: BNP (last 3 results) Recent Labs    06/25/20 1914 06/27/20 0047 12/20/20 1301  BNP >4,500.0* >4,500.0* >3,762.8*   Basic Metabolic Panel: Recent Labs  Lab 12/20/20 1301 12/21/20 0239 12/22/20 0236 12/23/20 0401  NA 142 140 135 134*  K 3.7 3.5 3.3* 4.6  CL 112* 107 102 101  CO2 21* 25 25 23   GLUCOSE 120* 107* 93 90  BUN 50* 50* 45* 41*  CREATININE 1.64* 2.03* 1.59* 1.32*  CALCIUM 9.8 9.5 8.9 9.0   Liver Function Tests: No results for input(s): AST, ALT, ALKPHOS, BILITOT, PROT, ALBUMIN in the last 168 hours. No results for input(s): LIPASE, AMYLASE in the last 168 hours. No results for input(s): AMMONIA in the last 168 hours. CBC: Recent Labs  Lab 12/20/20 1301 12/21/20 0239  WBC 7.3 7.4  NEUTROABS 5.2  --   HGB 12.2 11.3*  HCT 37.7 34.4*  MCV 98.7 95.3  PLT 191 180   Cardiac Enzymes: No results for input(s): CKTOTAL, CKMB, CKMBINDEX, TROPONINI in the last 168 hours. BNP: Invalid input(s): POCBNP CBG: Recent Labs  Lab 12/22/20 2115  GLUCAP 105*   D-Dimer No results for input(s): DDIMER in the last 72 hours. Hgb A1c No results for input(s): HGBA1C in the last 72 hours. Lipid Profile No results for input(s): CHOL, HDL, LDLCALC, TRIG, CHOLHDL, LDLDIRECT in the last 72 hours. Thyroid function studies No results for input(s): TSH, T4TOTAL, T3FREE, THYROIDAB in the last 72 hours.  Invalid input(s): FREET3 Anemia work up No results for input(s): VITAMINB12, FOLATE, FERRITIN, TIBC, IRON, RETICCTPCT in the last 72  hours. Urinalysis No results found for: COLORURINE, APPEARANCEUR, LABSPEC, [place], GLUCOSEU, [place], [place], KETONESUR, PROTEINUR, UROBILINOGEN, NITRITE, [place]  Microbiology Recent Results (from the past 240 hour(s))  Resp Panel by RT-PCR (Flu A&B, Covid) Nasopharyngeal Swab     Status: None   Collection Time: 12/20/20  1:47 PM   Specimen:  Nasopharyngeal Swab; Nasopharyngeal(NP) swabs in vial transport medium  Result Value Ref Range Status   SARS Coronavirus 2 by RT PCR NEGATIVE NEGATIVE Final    Comment: (NOTE) SARS-CoV-2 target nucleic acids are NOT DETECTED.  The SARS-CoV-2 RNA is generally detectable in upper respiratory specimens during the acute phase of infection. The lowest concentration of SARS-CoV-2 viral copies this assay can detect is 138 copies/mL. A negative result does not preclude SARS-Cov-2 infection and should not be used as the sole basis for treatment or other patient management decisions. A negative result may occur with  improper specimen collection/handling, submission of specimen other than nasopharyngeal swab, presence of viral mutation(s) within the areas targeted by this assay, and inadequate number of viral copies(<138 copies/mL). A negative result must be combined with clinical observations, patient history, and epidemiological information. The expected result is Negative.  Fact Sheet for Patients:  EntrepreneurPulse.com.au  Fact Sheet for Healthcare Providers:  IncredibleEmployment.be  This test is no t yet approved or cleared by the [place] FDA and  has been authorized for detection and/or diagnosis of SARS-CoV-2 by FDA under an Emergency Use Authorization (EUA). This EUA will remain  in effect (meaning this test can be used) for the duration of the COVID-19 declaration under Section 564(b)(1) of the Act, 21 U.S.C.section 360bbb-3(b)(1), unless the authorization is terminated  or revoked sooner.        Influenza A by PCR NEGATIVE NEGATIVE Final   Influenza B by PCR NEGATIVE NEGATIVE Final    Comment: (NOTE) The Xpert Xpress SARS-CoV-2/FLU/RSV plus assay is intended as an aid in the diagnosis of influenza from Nasopharyngeal swab specimens and should not be used as a sole basis for treatment. Nasal washings and aspirates are unacceptable for Xpert Xpress SARS-CoV-2/FLU/RSV testing.  Fact Sheet for Patients: EntrepreneurPulse.com.au  Fact Sheet for Healthcare Providers: IncredibleEmployment.be  This test is not yet approved or cleared by the [place] FDA and has been authorized for detection and/or diagnosis of SARS-CoV-2 by FDA under an Emergency Use Authorization (EUA). This EUA will remain in effect (meaning this test can be used) for the duration of the COVID-19 declaration under Section 564(b)(1) of the Act, 21 U.S.C. section 360bbb-3(b)(1), unless the authorization is terminated or revoked.  Performed at [facility], [place] [address] [place], [place] [address]     Time coordinating discharge: Approximately 40 minutes  Patrecia Pour, MD  [facility] 12/23/2020, 10:22 AM

## 2020-12-23 NOTE — Progress Notes (Signed)
This chaplain is present for the notarizing of the Pt. Advance Directive: HCPOA with the Pt., Pt. son-[name], notary, and two witnesses.  This document will supercede any previous documents.    The Pt named [name] as her healthcare agent. If the healthcare agent is unable or unwilling to serve the Pt. next choice is Claudean Severance.  The chaplain gave the Pt. the original AD along with two copies.  The chaplain scanned the AD into the Pt. EMR.  This chaplain is available for F/U spiritual care as needed.  Chaplain Sallyanne Kuster [phone]

## 2020-12-23 NOTE — Progress Notes (Signed)
Physical Therapy Treatment Patient Details Name: [name] MRN: [id number] DOB: [birth year] Today's Date: 12/23/2020   History of Present Illness 87 yo female presenting to Ed on 12/4 with SOB. PMH including arthritis, dementia, cradycardia, cancer, CAD, CABG (2003), HTN, hip arthroplasty (2022), and lumbar lami (2013).    PT Comments    Plan for discharge home today, focus of session stair training. Pt son present throughout session, report pt is going home with him and that there are only 2 small steps into his home. Pt is limited in safe mobility by decreased strength, mild instability and decreased endurance. Pt is supervision for bed mobility and min guard for transfers and short distance ambulation without AD. Pt either requires UE support or outside assist for ascend /descent of steps, but will have little trouble with entering son's home. D/c plans remain appropriate.    Recommendations for follow up therapy are one component of a multi-disciplinary discharge planning process, led by the attending physician.  Recommendations may be updated based on patient status, additional functional criteria and insurance authorization.  Follow Up Recommendations  Home health PT     Assistance Recommended at Discharge Frequent or constant Supervision/Assistance  Equipment Recommendations  None recommended by PT (pt has necessary equipment)    Recommendations for Other Services       Precautions / Restrictions Precautions Precautions: Fall Precaution Comments: monitor HR and sats Restrictions Weight Bearing Restrictions: No     Mobility  Bed Mobility Overal bed mobility: Needs Assistance Bed Mobility: Supine to Sit     Supine to sit: Supervision     General bed mobility comments: supervised for safety and lines    Transfers Overall transfer level: Needs assistance Equipment used: None Transfers: Sit to/from Stand Sit to Stand: Min guard           General transfer  comment: min guard to stand, able to self steady prior to ambulation    Ambulation/Gait Ambulation/Gait assistance: Min guard Gait Distance (Feet): 15 Feet Assistive device: None Gait Pattern/deviations: Step-through pattern;Decreased stride length;Staggering right;Wide base of support Gait velocity: reduced Gait velocity interpretation: <1.31 ft/sec, indicative of household ambulator   General Gait Details: waddling gait with increased lateral excursion   Stairs Stairs: Yes Stairs assistance: Min guard Stair Management: Forwards;Backwards Number of Stairs: 1 (x2) General stair comments: pt son present reports pt is going to his home and only has two small steps to enter without rails, pt requires UE support to step up, son provides assist at baseline          Balance Overall balance assessment: Needs assistance Sitting-balance support: Feet supported Sitting balance-Leahy Scale: Good     Standing balance support: No upper extremity supported Standing balance-Leahy Scale: Fair Standing balance comment: no outside assist required, but does not accept challenge to balance                            Cognition Arousal/Alertness: Awake/alert Behavior During Therapy: WFL for tasks assessed/performed Overall Cognitive Status: History of cognitive impairments - at baseline                                 General Comments: family assists with filling in details           General Comments General comments (skin integrity, edema, etc.): SpO2 on RA >96%O2 with mobilization, HR with activity 89bpm, with  onset of Afib, BP 109/61      Pertinent Vitals/Pain Pain Assessment: No/denies pain Faces Pain Scale: No hurt     PT Goals (current goals can now be found in the care plan section) Acute Rehab PT Goals Patient Stated Goal: to get home with family PT Goal Formulation: With patient Time For Goal Achievement: 01/04/21 Potential to Achieve Goals:  Good Progress towards PT goals: Progressing toward goals    Frequency    Min 3X/week      PT Plan Current plan remains appropriate       AM-PAC PT "6 Clicks" Mobility   Outcome Measure  Help needed turning from your back to your side while in a flat bed without using bedrails?: A Little Help needed moving from lying on your back to sitting on the side of a flat bed without using bedrails?: A Little Help needed moving to and from a bed to a chair (including a wheelchair)?: None Help needed standing up from a chair using your arms (e.g., wheelchair or bedside chair)?: None Help needed to walk in hospital room?: A Little Help needed climbing 3-5 steps with a railing? : A Lot 6 Click Score: 19    End of Session Equipment Utilized During Treatment: Gait belt Activity Tolerance: Patient tolerated treatment well;Other (comment) (session limited by desire to eat breakfast) Patient left: in chair;with call bell/phone within reach;with chair alarm set;with nursing/sitter in room Nurse Communication: Mobility status;Other (comment) (vitals with mobility) PT Visit Diagnosis: Unsteadiness on feet (R26.81);Muscle weakness (generalized) (M62.81);Difficulty in walking, not elsewhere classified (R26.2)     Time: 4514-6047 PT Time Calculation (min) (ACUTE ONLY): 16 min  Charges:  $Gait Training: 8-22 mins                     [name] B. Migdalia Dk PT, DPT Acute Rehabilitation Services Pager [phone] Office [phone]    [place] 12/23/2020, 12:08 PM

## 2021-01-21 NOTE — Progress Notes (Deleted)
Evaluation Performed:  Follow-up visit  Date:  01/21/2021   ID:  [name], DOB 1931/12/25, MRN [id number]  Provider Location: Office  PCP:  Lajean Manes, MD  Cardiologist:  Jenkins Rouge, MD   Electrophysiologist:  None   Chief Complaint:  CAD/AS  History of Present Illness:    [name] is an 88 y.o. female with a history of  ASCAD s/p CABG, HTN, normal LVF, dyslipidemia and AS     CABG 2004 with LIMA to LAD SVG to Diagonal SVG OM and SVG PDA   No palpitations syncope or chest pain   I took care of her 2nd husband [name] Sister [name] is a patient of mine as well   Last echo 11/28/19 EF normal mean gradient 64 peak 69 mmHg DVI o.13 AVA 0.51   She has advanced dementia and is not a TAVR candidate She has 3 boys one Claiborne [name] lives with her and one lives close with her sister [name] They need to have family conference to consider assisted living for her   Admitted with CHF 06/27/20 BNP 4500 R/O Rx with iv lasix  Was not sent home on diuretic   Been fine since d/c not needing Lasix  She is a DNR confirmed with son with her today She is followed by palliative care Hospitalized for mild CHF 12/4-12/7 and given iv lasix   ***   Past Medical History:  Diagnosis Date   Anemia    Aortic stenosis    mild AS by 02/18/11  [name])   Arthritis    Bradycardia    Asymtomatic resolved   Cancer ([place])    ceervical   Chronic renal disease, stage III ([place])    Coronary artery disease    CABG 2003   Dyslipidemia    goal LSL less then 70 but statin intolerant   GERD (gastroesophageal reflux disease)    Hypertension    Hypothyroidism    Iron deficiency anemia 07/18/2010   Mild AI (aortic insufficiency)    Mild aortic stenosis    OP (osteoporosis) 10/18/2010   fosamax   Statin intolerance    Past Surgical History:  Procedure Laterality Date   ABDOMINAL HYSTERECTOMY     CARDIAC CATHETERIZATION     2004   cataracts     COLONOSCOPY  09/2010   normal EGD abd  COLONOSCOPY   CORONARY ARTERY BYPASS GRAFT  2004   with LIMA to LAD, SVG to diagonal, SVG to OM, SOVG to PDA normal LVF   HIP ARTHROPLASTY Left 03/23/2020   Procedure: ARTHROPLASTY BIPOLAR HIP (HEMIARTHROPLASTY) anterior approach;  Surgeon: Rod Can, MD;  Location: [facility];  Service: Orthopedics;  Laterality: Left;   LUMBAR LAMINECTOMY  09/23/2011   Procedure: MICRODISCECTOMY LUMBAR LAMINECTOMY;  Surgeon: Marybelle Killings, MD;  Location: [facility];  Service: Orthopedics;  Laterality: N/A;  L4-5 Decompression     No outpatient medications have been marked as taking for the 02/03/21 encounter (Appointment) with Joann Hector, MD.     Allergies:   Aleve [naproxen sodium], Aleve [naproxen], Crestor [rosuvastatin], Excedrin extra strength [aspirin-acetaminophen-caffeine], Meloxicam, Statins, and Vytorin [ezetimibe-simvastatin]   Social History   Tobacco Use   Smoking status: Never   Smokeless tobacco: Never  Vaping Use   Vaping Use: Never used  Substance Use Topics   Alcohol use: No   Drug use: No     Family Hx: The patient's family history includes Asthma in her father; Heart Problems in her mother.  ROS:   Please see the history of present illness.     All other systems reviewed and are negative.   Prior CV studies:   The following studies were reviewed today: Echo 01/30/17  Echo 10/04/18  Echo 04/30/19   Labs/Other Tests and Data Reviewed:    EKG:   04/07/16 SR rate 58 normal SR read as flutter but artifact rate 70 normal   Recent Labs: 06/26/2020: TSH 4.457 12/20/2020: B Natriuretic Peptide >4,500.0 12/21/2020: Hemoglobin 11.3; Platelets 180 12/23/2020: BUN 41; Creatinine, Ser 1.32; Potassium 4.6; Sodium 134   Recent Lipid Panel Lab Results  Component Value Date/Time   CHOL 208 (H) 03/24/2014 10:08 AM   TRIG 129.0 03/24/2014 10:08 AM   HDL 44.80 03/24/2014 10:08 AM   CHOLHDL 5 03/24/2014 10:08 AM   LDLCALC 137 (H) 03/24/2014 10:08 AM    Wt Readings from Last 3  Encounters:  12/23/20 88 lb 6.5 oz (40.1 kg)  08/14/20 94 lb (42.6 kg)  06/27/20 91 lb 0.8 oz (41.3 kg)     Objective:    Vital Signs:  There were no vitals taken for this visit.  Affect appropriate Healthy:  appears stated age 28: normal Neck supple with no adenopathy JVP normal left  bruits no thyromegaly Lungs clear with no wheezing and good diaphragmatic motion Heart:  S1/S2 AS  murmur, no rub, gallop or click PMI normal Abdomen: benighn, BS positve, no tenderness, no AAA no bruit.  No HSM or HJR Distal pulses intact with no bruits No edema Neuro non-focal Skin warm and dry Right TKR    ASSESSMENT & PLAN:    AS:  Severe mean gradient  53 mmHg by TTE 11/28/19  given age and advanced dementia not a candidate for TAVR will prescribe Lasix 20 mg PO PRN for dyspnea, weight gain or edema  CAD/CABG:  2004 no chest pain observe given age and lack of symptoms HLD:  On zetia labs with primary Thyroid:  On replacement labs with primary   COVID-19 Education: The signs and symptoms of COVID-19 were discussed with the patient and how to seek care for testing (follow up with PCP or arrange E-visit).  The importance of social distancing was discussed today.  Time:   Today, I have spent 30 minutes with the patient     Medication Adjustments/Labs and Tests Ordered: Current medicines are reviewed at length with the patient today.  Concerns regarding medicines are outlined above.  Tests Ordered:  None   Medication Changes:  Lasix 20 mg PO PRN   Disposition:  Follow up in a year   Signed, Jenkins Rouge, MD  01/21/2021 9:45 AM    [place]

## 2021-02-03 ENCOUNTER — Ambulatory Visit: Payer: Medicare Other | Admitting: Cardiovascular Disease

## 2021-02-16 DIAGNOSIS — I110 Hypertensive heart disease with heart failure: Secondary | ICD-10-CM | POA: Diagnosis not present

## 2021-03-16 DIAGNOSIS — I110 Hypertensive heart disease with heart failure: Secondary | ICD-10-CM | POA: Diagnosis not present

## 2021-04-16 DIAGNOSIS — I110 Hypertensive heart disease with heart failure: Secondary | ICD-10-CM | POA: Diagnosis not present

## 2021-05-01 ENCOUNTER — Observation Stay (HOSPITAL_COMMUNITY)
Admission: EM | Admit: 2021-05-01 | Discharge: 2021-05-03 | Disposition: A | Discharge status: Home Health | Payer: Medicare Other | Attending: Family Medicine | Admitting: Family Medicine

## 2021-05-01 ENCOUNTER — Emergency Department (HOSPITAL_COMMUNITY): Payer: Medicare Other

## 2021-05-01 ENCOUNTER — Encounter (HOSPITAL_COMMUNITY): Payer: Self-pay | Admitting: Emergency Medicine

## 2021-05-01 DIAGNOSIS — I5033 Acute on chronic diastolic (congestive) heart failure: Secondary | ICD-10-CM | POA: Diagnosis not present

## 2021-05-01 DIAGNOSIS — I130 Hypertensive heart and chronic kidney disease with heart failure and stage 1 through stage 4 chronic kidney disease, or unspecified chronic kidney disease: Principal | ICD-10-CM | POA: Insufficient documentation

## 2021-05-01 DIAGNOSIS — Z951 Presence of aortocoronary bypass graft: Secondary | ICD-10-CM | POA: Insufficient documentation

## 2021-05-01 DIAGNOSIS — J9601 Acute respiratory failure with hypoxia: Secondary | ICD-10-CM

## 2021-05-01 DIAGNOSIS — R0602 Shortness of breath: Secondary | ICD-10-CM | POA: Diagnosis not present

## 2021-05-01 DIAGNOSIS — R609 Edema, unspecified: Secondary | ICD-10-CM | POA: Diagnosis not present

## 2021-05-01 DIAGNOSIS — J9811 Atelectasis: Secondary | ICD-10-CM | POA: Diagnosis not present

## 2021-05-01 DIAGNOSIS — Z7982 Long term (current) use of aspirin: Secondary | ICD-10-CM | POA: Insufficient documentation

## 2021-05-01 DIAGNOSIS — Z7189 Other specified counseling: Secondary | ICD-10-CM

## 2021-05-01 DIAGNOSIS — F03A0 Unspecified dementia, mild, without behavioral disturbance, psychotic disturbance, mood disturbance, and anxiety: Secondary | ICD-10-CM | POA: Diagnosis not present

## 2021-05-01 DIAGNOSIS — Z96642 Presence of left artificial hip joint: Secondary | ICD-10-CM | POA: Insufficient documentation

## 2021-05-01 DIAGNOSIS — R0902 Hypoxemia: Secondary | ICD-10-CM | POA: Diagnosis not present

## 2021-05-01 DIAGNOSIS — N189 Chronic kidney disease, unspecified: Secondary | ICD-10-CM

## 2021-05-01 DIAGNOSIS — E039 Hypothyroidism, unspecified: Secondary | ICD-10-CM

## 2021-05-01 DIAGNOSIS — R778 Other specified abnormalities of plasma proteins: Secondary | ICD-10-CM | POA: Insufficient documentation

## 2021-05-01 DIAGNOSIS — I10 Essential (primary) hypertension: Secondary | ICD-10-CM | POA: Diagnosis present

## 2021-05-01 DIAGNOSIS — R627 Adult failure to thrive: Secondary | ICD-10-CM | POA: Diagnosis not present

## 2021-05-01 DIAGNOSIS — N1832 Chronic kidney disease, stage 3b: Secondary | ICD-10-CM | POA: Diagnosis not present

## 2021-05-01 DIAGNOSIS — R6889 Other general symptoms and signs: Secondary | ICD-10-CM | POA: Diagnosis not present

## 2021-05-01 DIAGNOSIS — Z20822 Contact with and (suspected) exposure to covid-19: Secondary | ICD-10-CM | POA: Insufficient documentation

## 2021-05-01 DIAGNOSIS — J90 Pleural effusion, not elsewhere classified: Secondary | ICD-10-CM | POA: Diagnosis not present

## 2021-05-01 DIAGNOSIS — Z79899 Other long term (current) drug therapy: Secondary | ICD-10-CM | POA: Insufficient documentation

## 2021-05-01 DIAGNOSIS — E872 Acidosis, unspecified: Secondary | ICD-10-CM | POA: Diagnosis not present

## 2021-05-01 DIAGNOSIS — R069 Unspecified abnormalities of breathing: Secondary | ICD-10-CM | POA: Diagnosis not present

## 2021-05-01 DIAGNOSIS — Z743 Need for continuous supervision: Secondary | ICD-10-CM | POA: Diagnosis not present

## 2021-05-01 DIAGNOSIS — I2510 Atherosclerotic heart disease of native coronary artery without angina pectoris: Secondary | ICD-10-CM | POA: Diagnosis not present

## 2021-05-01 LAB — CBC
HCT: 36.1 % (ref 36.0–46.0)
Hemoglobin: 12 g/dL (ref 12.0–15.0)
MCH: 31.9 pg (ref 26.0–34.0)
MCHC: 33.2 g/dL (ref 30.0–36.0)
MCV: 96 fL (ref 80.0–100.0)
Platelets: 186 10*3/uL (ref 150–400)
RBC: 3.76 MIL/uL — ABNORMAL LOW (ref 3.87–5.11)
RDW: 14.1 % (ref 11.5–15.5)
WBC: 7.9 10*3/uL (ref 4.0–10.5)
nRBC: 0 % (ref 0.0–0.2)

## 2021-05-01 LAB — BASIC METABOLIC PANEL
Anion gap: 10 (ref 5–15)
BUN: 27 mg/dL — ABNORMAL HIGH (ref 8–23)
CO2: 19 mmol/L — ABNORMAL LOW (ref 22–32)
Calcium: 9.6 mg/dL (ref 8.9–10.3)
Chloride: 111 mmol/L (ref 98–111)
Creatinine, Ser: 1.61 mg/dL — ABNORMAL HIGH (ref 0.44–1.00)
GFR, Estimated: 31 mL/min — ABNORMAL LOW (ref >60)
Glucose, Bld: 124 mg/dL — ABNORMAL HIGH (ref 70–99)
Potassium: 4.8 mmol/L (ref 3.5–5.1)
Sodium: 140 mmol/L (ref 135–145)

## 2021-05-01 LAB — RESP PANEL BY RT-PCR (FLU A&B, COVID) ARPGX2
Influenza A by PCR: NEGATIVE
Influenza B by PCR: NEGATIVE
SARS Coronavirus 2 by RT PCR: NEGATIVE

## 2021-05-01 LAB — TROPONIN I (HIGH SENSITIVITY)
Troponin I (High Sensitivity): 33 ng/L — ABNORMAL HIGH (ref <18)
Troponin I (High Sensitivity): 52 ng/L — ABNORMAL HIGH (ref <18)

## 2021-05-01 LAB — BRAIN NATRIURETIC PEPTIDE: B Natriuretic Peptide: >4500 pg/mL — ABNORMAL HIGH (ref 0.0–100.0)

## 2021-05-01 MED ORDER — LEVOTHYROXINE SODIUM 75 MCG PO TABS
75.0000 ug | ORAL_TABLET | Freq: Every day | ORAL | Status: DC
  Administered 2021-05-01 – 2021-05-02 (×2): 75 ug via ORAL
  Filled 2021-05-01 (×2): qty 1

## 2021-05-01 MED ORDER — FUROSEMIDE 10 MG/ML IJ SOLN
20.0000 mg | Freq: Once | INTRAMUSCULAR | Status: AC
  Administered 2021-05-01: 20 mg via INTRAVENOUS
  Filled 2021-05-01: qty 2

## 2021-05-01 MED ORDER — CARVEDILOL 6.25 MG PO TABS
6.2500 mg | ORAL_TABLET | Freq: Two times a day (BID) | ORAL | Status: DC
  Administered 2021-05-01 – 2021-05-03 (×4): 6.25 mg via ORAL
  Filled 2021-05-01 (×2): qty 1
  Filled 2021-05-01: qty 2
  Filled 2021-05-01 (×2): qty 1

## 2021-05-01 MED ORDER — HEPARIN SODIUM (PORCINE) 5000 UNIT/ML IJ SOLN
5000.0000 [IU] | Freq: Three times a day (TID) | INTRAMUSCULAR | Status: DC
  Administered 2021-05-01 – 2021-05-03 (×6): 5000 [IU] via SUBCUTANEOUS
  Filled 2021-05-01 (×6): qty 1

## 2021-05-01 MED ORDER — FUROSEMIDE 10 MG/ML IJ SOLN
20.0000 mg | Freq: Two times a day (BID) | INTRAMUSCULAR | Status: DC
  Administered 2021-05-01 – 2021-05-02 (×2): 20 mg via INTRAVENOUS
  Filled 2021-05-01 (×3): qty 2

## 2021-05-01 MED ORDER — ASPIRIN 81 MG PO CHEW
81.0000 mg | CHEWABLE_TABLET | Freq: Every day | ORAL | Status: DC
  Administered 2021-05-02 – 2021-05-03 (×2): 81 mg via ORAL
  Filled 2021-05-01 (×2): qty 1

## 2021-05-01 NOTE — ED Triage Notes (Signed)
Pt  arrive by EMS from home for c/o increase SOB for the past 2 days worse tonight, initial SPO2 by EMS 85% on EMS arrival placed on NRM up to 100%. ?

## 2021-05-01 NOTE — ED Notes (Addendum)
Patient ambulated to nurse's station to ask about location of bathroom. Bedside commode placed in room, patient instructed to utilize call light for assistance ambulating to restroom. ?

## 2021-05-01 NOTE — Plan of Care (Signed)

## 2021-05-01 NOTE — ED Provider Notes (Signed)
? ?Emergency Department Provider Note ? ? ?I have reviewed the triage vital signs and the nursing notes. ? ? ?HISTORY ? ?Chief Complaint ?Shortness of Breath ? ? ?HPI ?[name] is a 88 y.o. female with PMH of CAD, HTN, GERD, AS (non-operable), and CHF presents to the ED with progressively worsening SOB and hypoxemia. She lives at home with her son. Patient with progressively worsening symptoms and weakness. No CP. No fever. EMS was called and found the patient with RA sats of 85%. Patient arrives on supplemental O2 which is new for her.  ? ? ?Past Medical History:  ?Diagnosis Date  ? Anemia   ? Aortic stenosis   ? mild AS by 02/18/11  [name]  ? Arthritis   ? Bradycardia   ? Asymtomatic resolved  ? Cancer [facility])   ? ceervical  ? Chronic renal disease, stage III ([place])   ? Coronary artery disease   ? CABG 2003  ? Dyslipidemia   ? goal LSL less then 70 but statin intolerant  ? GERD (gastroesophageal reflux disease)   ? Hypertension   ? Hypothyroidism   ? Iron deficiency anemia 07/18/2010  ? Mild AI (aortic insufficiency)   ? Mild aortic stenosis   ? OP (osteoporosis) 10/18/2010  ? fosamax  ? Statin intolerance   ? ? ?Review of Systems ? ?Constitutional: No fever/chills. Positive weakness.  ?Eyes: No visual changes. ?ENT: No sore throat. ?Cardiovascular: Denies chest pain. ?Respiratory: Positive shortness of breath. ?Gastrointestinal: No abdominal pain.  No nausea, no vomiting.  No diarrhea.  No constipation. ?Genitourinary: Negative for dysuria. ?Musculoskeletal: Negative for back pain. ?Skin: Negative for rash. ?Neurological: Negative for headaches, focal weakness or numbness. ? ? ?____________________________________________ ? ? ?PHYSICAL EXAM: ? ?VITAL SIGNS: ?ED Triage Vitals  ?Enc Vitals Group  ?   BP --   ?   Pulse Rate 05/01/21 0153 82  ?   Resp 05/01/21 0153 (!) 25  ?   Temp 05/01/21 0153 97.8 ?F (36.6 ?C)  ?   Temp Source 05/01/21 0153 Temporal  ?   SpO2 05/01/21 0153 97 %  ?   Weight 05/01/21 0154 88 lb  6.5 oz (40.1 kg)  ?   Height 05/01/21 0154 5' (1.524 m)  ? ?Constitutional: Alert and oriented. Well appearing and in no acute distress. ?Eyes: Conjunctivae are normal.  ?Head: Atraumatic. ?Nose: No congestion/rhinnorhea. ?Mouth/Throat: Mucous membranes are moist.  ?Neck: No stridor.   ?Cardiovascular: Normal rate, regular rhythm. Good peripheral circulation. Grossly normal heart sounds.   ?Respiratory: Normal respiratory effort.  No retractions. Lungs with crackles at the bases.  ?Gastrointestinal: Soft and nontender. No distention.  ?Musculoskeletal: No lower extremity tenderness with 1+ pitting edema bilaterally. No gross deformities of extremities. ?Neurologic:  Normal speech and language. No gross focal neurologic deficits are appreciated.  ?Skin:  Skin is warm, dry and intact. No rash noted. ? ?____________________________________________ ?  ?LABS ?(all labs ordered are listed, but only abnormal results are displayed) ? ?Labs Reviewed  ?BASIC METABOLIC PANEL - Abnormal; Notable for the following components:  ?    Result Value  ? CO2 19 (*)   ? Glucose, Bld 124 (*)   ? BUN 27 (*)   ? Creatinine, Ser 1.61 (*)   ? GFR, Estimated 31 (*)   ? All other components within normal limits  ?CBC - Abnormal; Notable for the following components:  ? RBC 3.76 (*)   ? All other components within normal limits  ?BRAIN NATRIURETIC PEPTIDE -  Abnormal; Notable for the following components:  ? B Natriuretic Peptide >4,500.0 (*)   ? All other components within normal limits  ?TROPONIN I (HIGH SENSITIVITY) - Abnormal; Notable for the following components:  ? Troponin I (High Sensitivity) 33 (*)   ? All other components within normal limits  ?RESP PANEL BY RT-PCR (FLU A&B, COVID) ARPGX2  ?TROPONIN I (HIGH SENSITIVITY)  ? ?____________________________________________ ? ?EKG ? ? EKG Interpretation ? ?Date/Time:  Saturday May 01 2021 01:58:35 EDT ?Ventricular Rate:  79 ?PR Interval:  156 ?QRS Duration: 98 ?QT Interval:  395 ?QTC  Calculation: 453 ?R Axis:   80 ?Text Interpretation: Sinus rhythm Left ventricular hypertrophy Repol abnrm suggests ischemia, diffuse leads Similar to prior Confirmed by Nanda Quinton [phone]) on 05/01/2021 2:08:22 AM ?  ? ?  ? ? ?____________________________________________ ? ?RADIOLOGY ? ?DG Chest 2 View ? ?Result Date: 05/01/2021 ?CLINICAL DATA:  Shortness of breath. EXAM: CHEST - 2 VIEW COMPARISON:  December 20, 2020 FINDINGS: Multiple sternal wires and vascular clips are present. Mild atelectasis is seen within the bilateral lung bases. Very small bilateral pleural effusions are noted. No pneumothorax is identified. The cardiac silhouette is moderately enlarged and unchanged in size. There is moderate severity calcification of the aortic arch. Degenerative changes seen throughout the thoracic spine. IMPRESSION: 1. Evidence of prior median sternotomy/CABG. 2. Mild bibasilar atelectasis. 3. Very small bilateral pleural effusions. Electronically Signed   By: Virgina Norfolk M.D.   On: 05/01/2021 02:56   ? ?____________________________________________ ? ? ?PROCEDURES ? ?Procedure(s) performed:  ? ?Procedures ? ?CRITICAL CARE ?Performed by: Margette Fast ?Total critical care time: 35 minutes ?Critical care time was exclusive of separately billable procedures and treating other patients. ?Critical care was necessary to treat or prevent imminent or life-threatening deterioration. ?Critical care was time spent personally by me on the following activities: development of treatment plan with patient and/or surrogate as well as nursing, discussions with consultants, evaluation of patient's response to treatment, examination of patient, obtaining history from patient or surrogate, ordering and performing treatments and interventions, ordering and review of laboratory studies, ordering and review of radiographic studies, pulse oximetry and re-evaluation of patient's condition. ? ?Nanda Quinton, MD ?Emergency  Medicine ? ?____________________________________________ ? ? ?INITIAL IMPRESSION / ASSESSMENT AND PLAN / ED COURSE ? ?Pertinent labs & imaging results that were available during my care of the patient were reviewed by me and considered in my medical decision making (see chart for details). ?  ?This patient is Presenting for Evaluation of SOB, which does require a range of treatment options, and is a complaint that involves a high risk of morbidity and mortality. ? ?The Differential Diagnoses includes CHF, CAP, pulmonary edema, PE, ACS.  ? ?Critical Interventions-  ?  ?Medications  ?furosemide (LASIX) injection 20 mg (20 mg Intravenous Given 05/01/21 0504)  ? ? ?Reassessment after intervention: Able to wean o2 when at rest but remains SOB with any exertion.  ? ? ?I did obtain Additional Historical Information from EMS.  ? ?I decided to review pertinent External Data, and in summary last admit was 12/23/20. ?  ?Clinical Laboratory Tests Ordered, included COVID and Flu negative. BNP > 4500. Creatine 1.61. Troponin 33.  ? ?Radiologic Tests Ordered, included CXR. I independently interpreted the images and agree with radiology interpretation.  ? ?Cardiac Monitor Tracing which shows NSR. ? ? ?Social Determinants of Health Risk with no smoking history.  ? ?Consult complete with Hospitalist, Dr. Marlowe Sax.  ? ?Medical Decision Making: Summary:  ?Patient  presents to the emergency department with weakness, shortness of breath, new hypoxemia.  Clinically seems volume up.  Suspect CHF/pulmonary edema.  Much lower suspicion for PE or ACS.  EKG similar to prior.  Labs are pending. ? ?Reevaluation with update and discussion with patient and son at bedside. Agree with plan for admit.  ? ?Disposition: admit ? ?____________________________________________ ? ?FINAL CLINICAL IMPRESSION(S) / ED DIAGNOSES ? ?Final diagnoses:  ?Acute respiratory failure with hypoxia ([place])  ? ? ?Note:  This document was prepared using Dragon voice recognition  software and may include unintentional dictation errors. ? ?Nanda Quinton, MD, FACEP ?Emergency Medicine ? ?  ?Margette Fast, MD ?05/01/21 [phone] ? ?

## 2021-05-01 NOTE — Care Management Obs Status (Signed)
MEDICARE OBSERVATION STATUS NOTIFICATION ? ? ?Patient Details  ?Name: [name] ?MRN: [id number] ?Date of Birth: 07/17/1931 ? ? ?Medicare Observation Status Notification Given:  Yes ?Permission to sign given by patient, notice attached to discharge instructions for print out ? ? ?Verdell Carmine, RN ?05/01/2021, 10:20 AM ?

## 2021-05-01 NOTE — Assessment & Plan Note (Signed)
Mild.  Likely due to CKD and diuretic use. ?-Continue to monitor ?

## 2021-05-01 NOTE — Progress Notes (Signed)
Patient seen after midnight ? ?88 year old white female from home C/O SOB 48 hours SPO2 EMS 85% ?Inoperable catheter AoS ?Cervical cancer ?CKD stage IIIb ?CABG + CAD + HTN + HLD + HF PEF ?Hypothyroid ?Multifactorial dementia ? ?Last admission 12/2020 = DOE 2/2 cardiogenic causes-diuresed discharged with palliative care follow-up-is followed by [facility] [place] ? ?Her son gives her h/o additionally-he tells me that she is somewhat mobile but over the past week she has been less so-he usually "catches it" but was not able to catch at this time in terms of her becoming more short of breath-she apparently could not move off of the couch or ambulate and he had to carry her to the restroom as she was short of breath-[facility] [place] does come out weekly with the nurse and social worker to monitor ? ?ED work-up here = BNP >4500 troponin 33 COVID influenza negative Rx Lasix 20 IV ? ?Coreg discontinued ?NAGMA noted ?Continuing GDMT ? ?O/e ?BP 103/75   Pulse 82   Temp 97.8 ?F (36.6 ?C) (Temporal)   Resp (!) 24   Ht 5' (1.524 m)   Wt 40.1 kg   SpO2 97%   BMI 17.27 kg/m?  ?She is very conversant pleasant and does not appear demented at all-she is able to tell me the date time year place person tells me that her son of 56 years has lived with her for the past 3 years ?She still drives at times although does not like to ?She feels pretty reasonable the main complaint she had this morning was that she was having some "abdominal discomfort nausea" she does not report any chest pain ? ?She is ambulated with the nurse to the rest room without any difficulty and does not appear to have any other issues ? ?monitor her overnight-her BNP is over 4500 and she has nonoperable AoS-discussed with son as above we will observe her overnight increase her diuretics and await [facility] [place] input-I think she can discharge as early as tomorrow with good hospice follow-up and support ? ?No charge ? ?Joann Griffes,  MD ?[facility] ?10:03 AM ? ?

## 2021-05-01 NOTE — Assessment & Plan Note (Signed)
Stable.  ?-Lasix as above ?-Continue Coreg ?

## 2021-05-01 NOTE — Consult Note (Addendum)
? ?                                                                                ?Consultation Note ?Date: 05/01/2021  ? ?Patient Name: [name]  ?DOB: 1931-11-03  MRN: [id number]  Age / Sex: 88 y.o., female  ?PCP: Lajean Manes, MD ?Referring Physician: Nita Sells, MD ? ?Reason for Consultation: Establishing goals of care ? ?HPI/Patient Profile: 88 y.o. female  with past medical history of cervical cancer, CKD stage IIIb, CAD status post CABG, severe aortic stenosis (not a candidate for intervention per cardiology), chronic diastolic CHF, hyperlipidemia, GERD, hypertension, hypothyroidism, dementia admitted on 05/01/2021 with 3 days of worsening shortness of breath.  ? ?Patient was admitted in December 2022 for dyspnea secondary to decompensated CHF and severe aortic stenosis for which she is not a candidate for intervention. PMT has been consulted to assist with goals of care conversation. ? ?Clinical Assessment and Goals of Care: ? ?I have reviewed medical records including EPIC notes, labs and imaging, received report from RN, assessed the patient and then called patient's son [name] to discuss diagnosis prognosis, [place], EOL wishes, disposition and options. ? ?I introduced Palliative Medicine as specialized medical care for people living with serious illness. It focuses on providing relief from the symptoms and stress of a serious illness. The goal is to improve quality of life for both the patient and the family. ? ?We discussed a brief life review of the patient and then focused on their current illness.  ? ? ?Discussion: ?Patient feels about the same as she has been overall - does not remember if she has been weaker or if her son has been carrying her. She would prefer not to return to the hospital and stay home. Patient's son was contacted by phone, who shares patient has been so weak lately that he has had to carry her. He reports that [facility] has been following since  hospitalization in December and he was told 3 months ago she was not quite ready for hospice or the additional support available through hospice. He is open to discussing further at a later time, as he is on the way to work currently. ? ? ?The difference between aggressive medical intervention and comfort care was considered in light of the patient's goals of care. Hospice and Palliative Care services outpatient were explained and offered.  ? ?Discussed the importance of continued conversation with family and the medical providers regarding overall plan of care and treatment options, ensuring decisions are within the context of the patient?s values and GOCs.  ? ?Questions and concerns were addressed.  Hard Choices booklet left for review. The family was encouraged to call with questions or concerns.  PMT will continue to support holistically.  ? ?NEXT OF KIN is patient's son [name]. [name] is HCPOA as well. ?  ? ?SUMMARY OF RECOMMENDATIONS   ?-DNR ?-Continue current care ?-PMT will continue to follow for ongoing goc discussions ? ?Prognosis:  ?< 6 months hospice appropriate given progression of CHF to include symptoms at rest, multiple comorbidities and severe functional decline ? ?Discharge Planning: To Be Determined  ? ?  ? ?Primary Diagnoses: ?  Present on Admission: ? Acute on chronic diastolic (congestive) heart failure (HCC) ? Essential hypertension, benign ? ? ?I have reviewed the medical record, interviewed the patient and family, and examined the patient. The following aspects are pertinent. ? ?Past Medical History:  ?Diagnosis Date  ? Anemia   ? Aortic stenosis   ? mild AS by 02/18/11  [name]  ? Arthritis   ? Bradycardia   ? Asymtomatic resolved  ? Cancer [facility])   ? ceervical  ? Chronic renal disease, stage III ([place])   ? Coronary artery disease   ? CABG 2003  ? Dyslipidemia   ? goal LSL less then 70 but statin intolerant  ? GERD (gastroesophageal reflux disease)   ? Hypertension   ? Hypothyroidism   ? Iron  deficiency anemia 07/18/2010  ? Mild AI (aortic insufficiency)   ? Mild aortic stenosis   ? OP (osteoporosis) 10/18/2010  ? fosamax  ? Statin intolerance   ? ?Social History  ? ?Socioeconomic History  ? Marital status: Widowed  ?  Spouse name: Not on file  ? Number of children: Not on file  ? Years of education: Not on file  ? Highest education level: Not on file  ?Occupational History  ? Not on file  ?Tobacco Use  ? Smoking status: Never  ? Smokeless tobacco: Never  ?Vaping Use  ? Vaping Use: Never used  ?Substance and Sexual Activity  ? Alcohol use: No  ? Drug use: No  ? Sexual activity: Not on file  ?Other Topics Concern  ? Not on file  ?Social History Narrative  ? Not on file  ? ?Social Determinants of Health  ? ?Financial Resource Strain: Not on file  ?Food Insecurity: Not on file  ?Transportation Needs: Not on file  ?Physical Activity: Not on file  ?Stress: Not on file  ?Social Connections: Not on file  ? ?Family History  ?Problem Relation Age of Onset  ? Heart Problems Mother   ? Asthma Father   ? ?Scheduled Meds: ? aspirin  81 mg Oral Daily  ? carvedilol  6.25 mg Oral BID WC  ? furosemide  20 mg Intravenous BID  ? heparin  5,000 Units Subcutaneous Q8H  ? levothyroxine  75 mcg Oral QHS  ? ?Continuous Infusions: ?PRN Meds:. ?Medications Prior to Admission:  ?Prior to Admission medications   ?Medication Sig Start Date End Date Taking? Authorizing Provider  ?acetaminophen (TYLENOL) 500 MG tablet Take 1,000 mg by mouth every 6 (six) hours as needed for mild pain.   Yes [provider]  ?amLODipine (NORVASC) 5 MG tablet Take 0.5 tablets (2.5 mg total) by mouth daily. ?Patient taking differently: Take 5 mg by mouth daily. 12/23/20  Yes Patrecia Pour, MD  ?aspirin 81 MG chewable tablet Chew 81 mg by mouth daily.   Yes [provider]  ?carvedilol (COREG) 6.25 MG tablet Take 1 tablet (6.25 mg total) by mouth 2 (two) times daily with a meal. 03/25/20  Yes Hongalgi, Lenis Dickinson, MD  ?Cholecalciferol (VITAMIN  D-3) 25 MCG (1000 UT) CAPS Take 1,000 Units by mouth daily.   Yes [provider]  ?ferrous sulfate 325 (65 FE) MG tablet Take 325 mg by mouth at bedtime.   Yes [provider]  ?furosemide (LASIX) 20 MG tablet Take 20 mg by mouth daily as needed for fluid.   Yes [provider]  ?levothyroxine (SYNTHROID, LEVOTHROID) 75 MCG tablet Take 75 mcg by mouth at bedtime.   Yes [provider]  ?  Multiple Vitamin (MULTIVITAMIN WITH MINERALS) TABS tablet Take 1 tablet by mouth daily.   Yes [provider]  ?vitamin B-12 (CYANOCOBALAMIN) 250 MCG tablet Take 250 mcg by mouth daily.   Yes [provider]  ? ?Allergies  ?Allergen Reactions  ? Aleve [Naproxen Sodium] Hives  ? Aleve [Naproxen]   ?  Other reaction(s): Unknown  ? Crestor [Rosuvastatin]   ?  Other reaction(s): lethargy  ? Excedrin Extra Strength [Aspirin-Acetaminophen-Caffeine] Nausea And Vomiting  ? Meloxicam   ?  Other reaction(s): felt poorly  ? Statins Other (See Comments)  ?  No energy, very weak  ? Vytorin [Ezetimibe-Simvastatin] Other (See Comments)  ?  Fatigue ?  ? ?Review of Systems  ?All other systems reviewed and are negative. ? ?Physical Exam ?Vitals and nursing note reviewed.  ?Constitutional:   ?   General: She is not in acute distress. ?   Appearance: She is ill-appearing.  ?   Interventions: Nasal cannula in place.  ?Cardiovascular:  ?   Rate and Rhythm: Normal rate.  ?Pulmonary:  ?   Effort: Pulmonary effort is normal.  ?Neurological:  ?   Mental Status: She is alert.  ? ? ?Vital Signs: BP (!) 109/58   Pulse 67   Temp 97.8 ?F (36.6 ?C) (Temporal)   Resp 20   Ht 5' (1.524 m)   Wt 40.1 kg   SpO2 93%   BMI 17.27 kg/m?  ?Pain Scale: 0-10 ?  ?Pain Score: 0-No pain ? ? ?SpO2: SpO2: 93 % ?O2 Device:SpO2: 93 % ?O2 Flow Rate: .O2 Flow Rate (L/min): 2 L/min ? ?IO: Intake/output summary: No intake or output data in the 24 hours ending 05/01/21 1410 ? ?LBM:   ?Baseline Weight: Weight: 40.1 kg ?Most  recent weight: Weight: 40.1 kg     ?Palliative Assessment/Data: ? ? ? ?Total time: ?I spent 55 minutes in the care of the patient today in the above activities and documenting the encounter. ? ? ?Dorthy Cooler, PA-C ?P

## 2021-05-01 NOTE — H&P (Signed)
?History and Physical  ? ? ?[name]:[id number] DOB: 19-Jul-1931 DOA: 05/01/2021 ? ?PCP: Lajean Manes, MD ? ?Patient coming from: Home ? ?Chief Complaint: Shortness of breath ? ?HPI: [name] is a 88 y.o. female with medical history significant of cervical cancer, CKD stage IIIb, CAD status post CABG, severe aortic stenosis (not a candidate for intervention per cardiology), chronic diastolic CHF, hyperlipidemia, GERD, hypertension, hypothyroidism, dementia.  Admitted in December 2022 for dyspnea secondary to decompensated CHF and severe aortic stenosis for which she is not a candidate for intervention.  She was diuresed with Lasix and discharged with palliative care follow-up.  She presents to the ED today complaining of shortness of breath.  SPO2 85% on EMS arrival.  In the ED, not hypoxic at rest but sats dropped with ambulation and became dyspneic.  Labs notable for bicarb 19, anion gap 10.  BUN 27, creatinine 1.6.  BNP >4500.  High-sensitivity troponin 33.  Chest x-ray showing mild bibasilar atelectasis and very small bilateral pleural effusions.  COVID and influenza PCR negative.  Patient was given IV Lasix 20 mg. ? ?Patient reports chronic shortness of breath which has been worse for the past 3 days.  She feels short of breath with exertion and even at rest.  Denies fevers, cough, or shortness of breath.  She is taking Lasix 20 mg daily as needed for fluid overload.  Reports orthopnea and bilateral lower extremity edema.  Patient states she is seen by a cardiologist and her PCP.  She does not recall seeing palliative care on an outpatient basis. ? ?Review of Systems:  ?Review of Systems  ?All other systems reviewed and are negative. ? ?Past Medical History:  ?Diagnosis Date  ? Anemia   ? Aortic stenosis   ? mild AS by 02/18/11  [name]  ? Arthritis   ? Bradycardia   ? Asymtomatic resolved  ? Cancer [facility])   ? ceervical  ? Chronic renal disease, stage III ([place])   ? Coronary artery disease   ?  CABG 2003  ? Dyslipidemia   ? goal LSL less then 70 but statin intolerant  ? GERD (gastroesophageal reflux disease)   ? Hypertension   ? Hypothyroidism   ? Iron deficiency anemia 07/18/2010  ? Mild AI (aortic insufficiency)   ? Mild aortic stenosis   ? OP (osteoporosis) 10/18/2010  ? fosamax  ? Statin intolerance   ? ? ?Past Surgical History:  ?Procedure Laterality Date  ? ABDOMINAL HYSTERECTOMY    ? CARDIAC CATHETERIZATION    ? 2004  ? cataracts    ? COLONOSCOPY  09/2010  ? normal EGD abd COLONOSCOPY  ? CORONARY ARTERY BYPASS GRAFT  2004  ? with LIMA to LAD, SVG to diagonal, SVG to OM, SOVG to PDA normal LVF  ? HIP ARTHROPLASTY Left 03/23/2020  ? Procedure: ARTHROPLASTY BIPOLAR HIP (HEMIARTHROPLASTY) anterior approach;  Surgeon: Rod Can, MD;  Location: [facility];  Service: Orthopedics;  Laterality: Left;  ? LUMBAR LAMINECTOMY  09/23/2011  ? Procedure: MICRODISCECTOMY LUMBAR LAMINECTOMY;  Surgeon: Marybelle Killings, MD;  Location: [facility];  Service: Orthopedics;  Laterality: N/A;  L4-5 Decompression  ? ? ? reports that she has never smoked. She has never used smokeless tobacco. She reports that she does not drink alcohol and does not use drugs. ? ?Allergies  ?Allergen Reactions  ? Aleve [Naproxen Sodium] Hives  ? Aleve [Naproxen]   ?  Other reaction(s): Unknown  ? Crestor [Rosuvastatin]   ?  Other reaction(s):  lethargy  ? Excedrin Extra Strength [Aspirin-Acetaminophen-Caffeine] Nausea And Vomiting  ? Meloxicam   ?  Other reaction(s): felt poorly  ? Statins Other (See Comments)  ?  No energy, very weak  ? Vytorin [Ezetimibe-Simvastatin] Other (See Comments)  ?  Fatigue ?  ? ? ?Family History  ?Problem Relation Age of Onset  ? Heart Problems Mother   ? Asthma Father   ? ? ?Prior to Admission medications   ?Medication Sig Start Date End Date Taking? Authorizing Provider  ?acetaminophen (TYLENOL) 500 MG tablet Take 1,000 mg by mouth every 6 (six) hours as needed for mild pain.   Yes [provider]  ?amLODipine  (NORVASC) 5 MG tablet Take 0.5 tablets (2.5 mg total) by mouth daily. ?Patient taking differently: Take 5 mg by mouth daily. 12/23/20  Yes Patrecia Pour, MD  ?aspirin 81 MG chewable tablet Chew 81 mg by mouth daily.   Yes [provider]  ?carvedilol (COREG) 6.25 MG tablet Take 1 tablet (6.25 mg total) by mouth 2 (two) times daily with a meal. 03/25/20  Yes Hongalgi, Lenis Dickinson, MD  ?Cholecalciferol (VITAMIN D-3) 25 MCG (1000 UT) CAPS Take 1,000 Units by mouth daily.   Yes [provider]  ?ferrous sulfate 325 (65 FE) MG tablet Take 325 mg by mouth at bedtime.   Yes [provider]  ?furosemide (LASIX) 20 MG tablet Take 20 mg by mouth daily as needed for fluid.   Yes [provider]  ?levothyroxine (SYNTHROID, LEVOTHROID) 75 MCG tablet Take 75 mcg by mouth at bedtime.   Yes [provider]  ?Multiple Vitamin (MULTIVITAMIN WITH MINERALS) TABS tablet Take 1 tablet by mouth daily.   Yes [provider]  ?vitamin B-12 (CYANOCOBALAMIN) 250 MCG tablet Take 250 mcg by mouth daily.   Yes [provider]  ? ? ?Physical Exam: ?Vitals:  ? 05/01/21 0154 05/01/21 0215 05/01/21 0325 05/01/21 0432  ?BP:  104/83 117/76 (!) 118/59  ?Pulse:  79 71 80  ?Resp:  (!) 22 (!) 27 16  ?Temp:      ?TempSrc:      ?SpO2:  100% 99% 98%  ?Weight: 40.1 kg     ?Height: 5' (1.524 m)     ? ? ?Physical Exam ?Vitals reviewed.  ?Constitutional:   ?   General: She is not in acute distress. ?HENT:  ?   Head: Normocephalic and atraumatic.  ?Eyes:  ?   Extraocular Movements: Extraocular movements intact.  ?   Conjunctiva/sclera: Conjunctivae normal.  ?Neck:  ?   Comments: JVD present ?Cardiovascular:  ?   Rate and Rhythm: Normal rate and regular rhythm.  ?   Pulses: Normal pulses.  ?Pulmonary:  ?   Effort: Pulmonary effort is normal. No respiratory distress.  ?   Breath sounds: No wheezing or rales.  ?Abdominal:  ?   General: Bowel sounds are normal. There is no distension.  ?   Palpations: Abdomen is  soft.  ?   Tenderness: There is no abdominal tenderness.  ?Musculoskeletal:  ?   Cervical back: Normal range of motion.  ?   Right lower leg: Edema present.  ?   Left lower leg: Edema present.  ?   Comments: 3+ pedal edema bilaterally  ?Skin: ?   General: Skin is warm and dry.  ?Neurological:  ?   General: No focal deficit present.  ?   Mental Status: She is alert and oriented to person, place, and time.  ?  ? ?Labs  on Admission: I have personally reviewed following labs and imaging studies ? ?CBC: ?Recent Labs  ?Lab 05/01/21 ?0155  ?WBC 7.9  ?HGB 12.0  ?HCT 36.1  ?MCV 96.0  ?PLT 186  ? ?Basic Metabolic Panel: ?Recent Labs  ?Lab 05/01/21 ?0155  ?NA 140  ?K 4.8  ?CL 111  ?CO2 19*  ?GLUCOSE 124*  ?BUN 27*  ?CREATININE 1.61*  ?CALCIUM 9.6  ? ?GFR: ?Estimated Creatinine Clearance: 15.6 mL/min (A) (by C-G formula based on SCr of 1.61 mg/dL (H)). ?Liver Function Tests: ?No results for input(s): AST, ALT, ALKPHOS, BILITOT, PROT, ALBUMIN in the last 168 hours. ?No results for input(s): LIPASE, AMYLASE in the last 168 hours. ?No results for input(s): AMMONIA in the last 168 hours. ?Coagulation Profile: ?No results for input(s): INR, PROTIME in the last 168 hours. ?Cardiac Enzymes: ?No results for input(s): CKTOTAL, CKMB, CKMBINDEX, TROPONINI in the last 168 hours. ?BNP (last 3 results) ?No results for input(s): PROBNP in the last 8760 hours. ?HbA1C: ?No results for input(s): HGBA1C in the last 72 hours. ?CBG: ?No results for input(s): GLUCAP in the last 168 hours. ?Lipid Profile: ?No results for input(s): CHOL, HDL, LDLCALC, TRIG, CHOLHDL, LDLDIRECT in the last 72 hours. ?Thyroid Function Tests: ?No results for input(s): TSH, T4TOTAL, FREET4, T3FREE, THYROIDAB in the last 72 hours. ?Anemia Panel: ?No results for input(s): VITAMINB12, FOLATE, FERRITIN, TIBC, IRON, RETICCTPCT in the last 72 hours. ?Urine analysis: ?No results found for: COLORURINE, APPEARANCEUR, [place], [place], [place], [place], [place], KETONESUR,  PROTEINUR, [place], NITRITE, LEUKOCYTESUR ? ?Radiological Exams on Admission: I have personally reviewed images ?DG Chest 2 View ? ?Result Date: 05/01/2021 ?CLINICAL DATA:  Shortness of breath. EXAM: CHEST - 2

## 2021-05-01 NOTE — Progress Notes (Signed)
Patient arrived to the unit at 1535 with sister. Pt is alert and oriented x 4. She ate dinner and is resting in bed at this time. Low blood pressure so pt did not want to take lasix or blood pressure medication. ?

## 2021-05-01 NOTE — Assessment & Plan Note (Signed)
Continue Synthroid °

## 2021-05-01 NOTE — Assessment & Plan Note (Signed)
Severe aortic stenosis ?Patient presenting with complaints of worsening dyspnea, orthopnea, and bilateral lower extremity edema.  Appears volume overloaded on exam.  BNP >4500.  Chest x-ray showing very small bilateral pleural effusions and no pulmonary edema.  Currently not hypoxic at rest but her sats drop with ambulation and she becomes dyspneic.  Echo done November 2021 showing LVEF 55 to 02%, grade 2 diastolic dysfunction, mild to moderate mitral regurgitation, and severe aortic stenosis.  Followed by cardiology and not a candidate for intervention for her severe aortic stenosis.  Currently taking Lasix 20 mg daily as needed at home. ?-Cardiac monitoring ?-Continue diuresis with IV Lasix 20 mg twice daily ?-Continue Coreg ?-Monitor intake and output ?-Daily weights ?-Low-sodium diet with fluid restriction ?-Continuous pulse ox, supplemental oxygen as needed to keep oxygen saturation above 92% ?-Will not repeat echocardiogram as it will not change management. ?-Very poor prognosis, palliative care consulted. ?

## 2021-05-01 NOTE — Assessment & Plan Note (Signed)
Elevated troponin ?Mild troponin elevation likely due to demand ischemia. EKG without acute ischemic changes and patient is not endorsing chest pain. ?-Continue to trend troponin ?-Continue aspirin and beta-blocker ?

## 2021-05-01 NOTE — Assessment & Plan Note (Addendum)
CKD stage IIIb-IV ?Stable.  Creatinine currently 1.6, baseline creatinine 1.3-1.7. ?-Continue to monitor renal function ?

## 2021-05-01 NOTE — Discharge Instructions (Signed)
Medicare Outpatient Observation Notice ?  ?Patient name:  [name] Patient number:  [id number]  ?                                                                                                                                                                     ?You?re a hospital outpatient receiving observation services. You are not an inpatient because: ? ?  ?Shortness of Breath ?  ?You require hospital care for evaluation and/or treatment.  It is expected you will need hospital care for less than a total of two days.  ?                                                                                                                                                                     ?  ?Being an outpatient may affect what you pay in a hospital: ?  ?When you?re a hospital outpatient, your observation stay is covered under Medicare Part B. ?  ?For Part B services, you generally pay: ?  ?A copayment for each outpatient hospital service you get. Part B copayments may vary by type of service. ?  ?20% of the Medicare-approved amount for most doctor services, after the Part B deductible. ?  ?Observation services may affect coverage and payment of your care after you leave the hospital: ? ?  ? ?If you need skilled nursing facility (SNF) care after you leave the hospital, Medicare Part A will only cover SNF care if you?ve had a 3-day minimum, medically necessary, inpatient hospital stay for a related illness or injury. An inpatient hospital stay begins the day the hospital admits you as an inpatient based on a doctor?s order and doesn?t include the day you?re discharged. ?  ?If you have Medicaid, a Medicare Advantage plan or other health plan, Medicaid or the plan may have different rules for SNF coverage after you leave the hospital. Check with Medicaid or your plan. ?  ?NOTE: Medicare Part A generally doesn?t cover outpatient hospital  services, like an observation stay. However, Part A will generally cover  medically necessary inpatient services if the hospital admits you as an inpatient based on a doctor?s order. In most cases, you?ll pay a one-time deductible for all of your inpatient hospital services for the first 60 days you?re in a hospital. ?                                                                                                                                                                     ?If you have any questions about your observation services, ask the hospital staff member giving you this notice or the doctor providing your hospital care. You can also ask to speak with someone from the hospital?s utilization or discharge planning department. ?  ?You can also call 1-800-MEDICARE (1-(669) 383-2021).  [employer] users should call 3022520465. ?  ?Form CMS [id number]   Expiration 01/16/2021 OMB APPROVAL [id number]  ?  ?  ? ?  ? ?Your costs for medications: ? ?  ? ?Generally, prescription and over-the-counter drugs, including ?self-administered drugs,? you get in a hospital outpatient setting (like an emergency department) aren?t covered by Part B. ?Self- administered drugs? are drugs you?d normally take on your own. For safety reasons, many hospitals don?t allow you to take medications brought from home. If you have a Medicare prescription drug plan (Part D), your plan may help you pay for these drugs. You?ll likely need to pay out-of- pocket for these drugs and submit a claim to your drug plan for a refund. Contact your drug plan for more information. ?  ?                                                                                                                                                                     ?If you?re enrolled in a Medicare Advantage plan (like an HMO or PPO) or other Medicare health plan (Part C), your costs and coverage may be different. Check with your plan to find out about  coverage for outpatient observation services. ? ?  ?If you?re a Chief of Staff  through your state Medicaid program, you can?t be billed for Part A or Part B deductibles, coinsurance, and copayments. ? ?                                                                                                                                                                    ?Additional Information (Optional): ?  ?  ?  ?  ?  ?                                                                                                                                                                     ?Please sign below to show you received and understand this notice. ? ?  ? ?                                    Date: 05/01/21 / Time:10:19 AM ?  ?CMS does not discriminate in its programs and activities. To request this publication in alternative format, please call: 1-800-MEDICARE or email:[email or web address]. ?  ?Form CMS [id number]   Expiration 01/16/2021 OMB APPROVAL [id number]  ?  ? ? ?Patient ? ? ?Add ?No image attached ?Trace ?Slow ?Corrupt ?Edit Data ?Change Template ?Print ?On  ? ?

## 2021-05-02 DIAGNOSIS — Z7189 Other specified counseling: Secondary | ICD-10-CM | POA: Diagnosis not present

## 2021-05-02 DIAGNOSIS — R627 Adult failure to thrive: Secondary | ICD-10-CM | POA: Diagnosis not present

## 2021-05-02 DIAGNOSIS — R778 Other specified abnormalities of plasma proteins: Secondary | ICD-10-CM | POA: Diagnosis not present

## 2021-05-02 DIAGNOSIS — I2510 Atherosclerotic heart disease of native coronary artery without angina pectoris: Secondary | ICD-10-CM | POA: Diagnosis not present

## 2021-05-02 DIAGNOSIS — J9601 Acute respiratory failure with hypoxia: Secondary | ICD-10-CM | POA: Diagnosis not present

## 2021-05-02 DIAGNOSIS — I5033 Acute on chronic diastolic (congestive) heart failure: Secondary | ICD-10-CM | POA: Diagnosis not present

## 2021-05-02 DIAGNOSIS — N1832 Chronic kidney disease, stage 3b: Secondary | ICD-10-CM | POA: Diagnosis not present

## 2021-05-02 DIAGNOSIS — I130 Hypertensive heart and chronic kidney disease with heart failure and stage 1 through stage 4 chronic kidney disease, or unspecified chronic kidney disease: Secondary | ICD-10-CM | POA: Diagnosis not present

## 2021-05-02 DIAGNOSIS — E039 Hypothyroidism, unspecified: Secondary | ICD-10-CM | POA: Diagnosis not present

## 2021-05-02 DIAGNOSIS — Z951 Presence of aortocoronary bypass graft: Secondary | ICD-10-CM | POA: Diagnosis not present

## 2021-05-02 DIAGNOSIS — Z7982 Long term (current) use of aspirin: Secondary | ICD-10-CM | POA: Diagnosis not present

## 2021-05-02 DIAGNOSIS — Z20822 Contact with and (suspected) exposure to covid-19: Secondary | ICD-10-CM | POA: Diagnosis not present

## 2021-05-02 DIAGNOSIS — F03A0 Unspecified dementia, mild, without behavioral disturbance, psychotic disturbance, mood disturbance, and anxiety: Secondary | ICD-10-CM | POA: Diagnosis not present

## 2021-05-02 LAB — RENAL FUNCTION PANEL
Albumin: 3.3 g/dL — ABNORMAL LOW (ref 3.5–5.0)
Anion gap: 9 (ref 5–15)
BUN: 40 mg/dL — ABNORMAL HIGH (ref 8–23)
CO2: 22 mmol/L (ref 22–32)
Calcium: 8.9 mg/dL (ref 8.9–10.3)
Chloride: 107 mmol/L (ref 98–111)
Creatinine, Ser: 1.97 mg/dL — ABNORMAL HIGH (ref 0.44–1.00)
GFR, Estimated: 24 mL/min — ABNORMAL LOW (ref >60)
Glucose, Bld: 110 mg/dL — ABNORMAL HIGH (ref 70–99)
Phosphorus: 3.6 mg/dL (ref 2.5–4.6)
Potassium: 3.8 mmol/L (ref 3.5–5.1)
Sodium: 138 mmol/L (ref 135–145)

## 2021-05-02 MED ORDER — FUROSEMIDE 40 MG PO TABS: 40.0000 mg | ORAL_TABLET | Freq: Every day | ORAL | Status: DC

## 2021-05-02 MED ORDER — FUROSEMIDE 40 MG PO TABS
60.0000 mg | ORAL_TABLET | Freq: Two times a day (BID) | ORAL | Status: DC
  Administered 2021-05-02 – 2021-05-03 (×2): 60 mg via ORAL
  Filled 2021-05-02 (×2): qty 1

## 2021-05-02 MED ORDER — ORAL CARE MOUTH RINSE
15.0000 mL | Freq: Two times a day (BID) | OROMUCOSAL | Status: DC
  Administered 2021-05-03: 15 mL via OROMUCOSAL

## 2021-05-02 NOTE — Plan of Care (Signed)
?  Problem: Education: ?Goal: Ability to demonstrate management of disease process will improve ?Outcome: Progressing ?  ?Problem: Cardiac: ?Goal: Ability to achieve and maintain adequate cardiopulmonary perfusion will improve ?Outcome: Progressing ?  ?

## 2021-05-02 NOTE — Progress Notes (Signed)
RT assessed pt. Sats and RR within normal range. No respiratory distress noted. Pt had recently moved from Bed to bedside commode. Pt needs time to recover from activity. RT spoke to RN about assessment. RN will notify RT if needed.  ?

## 2021-05-02 NOTE — Progress Notes (Signed)
Patient complaining of SHOB, sats 100% on 2.5L O2 via nasal cannula (O2 placed), Respirtaory called to reassess. ?

## 2021-05-02 NOTE — Progress Notes (Signed)
? ?                                                                                                                                                     ?                                                   ?Daily Progress Note  ? ?Patient Name: [name]       Date: 05/02/2021 ?DOB: 03-Mar-1931  Age: 88 y.o. MRN#: [id number] ?Attending Physician: Nita Sells, MD ?Primary Care Physician: Lajean Manes, MD ?Admit Date: 05/01/2021 ? ?Reason for Consultation/Follow-up: Establishing goals of care ? ?Subjective: ?Medical records reviewed including progress notes, labs, imaging. Patient assessed at the bedsides. Reports feeling well and has no complaints. She is agreeable to this PA calling her son.   ? ?I then called patient's son [name] to continue goals of care conversation. We discussed the difference between palliative care and hospice. He shares with me that he understands patient will soon be at end of life, however he does not want to expedite her death. Provided reassurance and education that hospice supports patients at end of life without hastening the dying process. I shared that patient is likely eligible for hospice support given clinical frailty score and sudden decline to a bedbound state. He acknowledges that is has been a few years since patient's care team has expressed her time is limited. He tells me that it is very difficult to physically take her to PCP and cardiology appointments but he is uneasy about deferring to a hospice physician rather than her current doctors. We dicussed hospice expertise in end of life disease processes including CHF and valvular disorders. He understands this and would be open to it if she continues to deteriorate, wishing to continue with palliative support at this time.   ? ?Questions and concerns addressed. PMT will continue to support holistically.  ? ?Length of Stay: 0 ? ?Current Medications: ?Scheduled Meds:  ? aspirin  81 mg Oral Daily  ? carvedilol  6.25  mg Oral BID WC  ? furosemide  20 mg Intravenous BID  ? heparin  5,000 Units Subcutaneous Q8H  ? levothyroxine  75 mcg Oral QHS  ? ? ?Physical Exam ?Vitals and nursing note reviewed.  ?Constitutional:   ?   General: She is not in acute distress. ?   Appearance: She is cachectic.  ?   Interventions: Nasal cannula in place.  ?Cardiovascular:  ?   Rate and Rhythm: Normal rate.  ?Pulmonary:  ?   Effort: Pulmonary effort is normal.  ?Skin: ?   General: Skin is warm and dry.  ?Neurological:  ?  Mental Status: She is alert.  ?Psychiatric:     ?   Mood and Affect: Mood normal.  ?         ? ?Vital Signs: BP 109/86 (BP Location: Left Arm)   Pulse 77   Temp 97.8 ?F (36.6 ?C) (Oral)   Resp (!) 21   Ht 5' (1.524 m)   Wt 40.3 kg   SpO2 99%   BMI 17.36 kg/m?  ?SpO2: SpO2: 99 % ?O2 Device: O2 Device: Room Air ?O2 Flow Rate: O2 Flow Rate (L/min): 2 L/min ? ?Intake/output summary:  ?Intake/Output Summary (Last 24 hours) at 05/02/2021 1057 ?Last data filed at 05/02/2021 1000 ?Gross per 24 hour  ?Intake 480 ml  ?Output 200 ml  ?Net 280 ml  ? ?LBM: Last BM Date : 05/02/21 ?Baseline Weight: Weight: 40.1 kg ?Most recent weight: Weight: 40.3 kg ? ?     ?Palliative Assessment/Data: 30% at best ? ? ? ? ? ?Patient Active Problem List  ? Diagnosis Date Noted  ? Normal anion gap metabolic acidosis 38/17/7116  ? Hypothyroidism 05/01/2021  ? Acute respiratory failure with hypoxia ([place])   ? CHF (congestive heart failure) ([place]) 12/21/2020  ? Protein-calorie malnutrition, severe 12/21/2020  ? Dementia without behavioral disturbance ([place]) 12/20/2020  ? DNR (do not resuscitate) 12/20/2020  ? Acute on chronic diastolic (congestive) heart failure ([place]) 06/25/2020  ? Acute CHF (congestive heart failure) ([place]) 06/25/2020  ? Fall   ? Left displaced femoral neck fracture ([place]) 03/22/2020  ? CKD (chronic kidney disease) 03/22/2020  ? CAD (coronary artery disease) 04/23/2013  ? Essential hypertension, benign 04/23/2013  ? Pure hypercholesterolemia  04/23/2013  ? Aortic stenosis   ? Spinal stenosis, lumbar 09/23/2011  ? ? ?Palliative Care Assessment & Plan  ? ?Patient Profile: ?88 y.o. female  with past medical history of cervical cancer, CKD stage IIIb, CAD status post CABG, severe aortic stenosis (not a candidate for intervention per cardiology), chronic diastolic CHF, hyperlipidemia, GERD, hypertension, hypothyroidism, dementia admitted on 05/01/2021 with 3 days of worsening shortness of breath.  ?  ?Patient was admitted in December 2022 for dyspnea secondary to decompensated CHF and severe aortic stenosis for which she is not a candidate for intervention. PMT has been consulted to assist with goals of care conversation. ?  ? ?Assessment: ?Goals of care conversation ?Acute CHF ?Severe aortic stenosis  ? ?Recommendations/Plan: ?DNR ?Continue supportive care ?Patient's son wishes to continue palliative care support and defer hospice enrollment at this time ?Psychosocial and emotional support provided ?PMT remains available for acute needs ? ? ?Prognosis: ? < 6 months ? ?Discharge Planning: ?Home with Palliative Services ? ?Care plan was discussed with patient, patient's son, Dr. Verlon Au, [facility] RN ? ? ?MDM: High ? ? ?Dorthy Cooler, PA-C ?Palliative Medicine Team ?Team phone # [phone] ? ?Thank you for allowing the Palliative Medicine Team to assist in the care of this patient. Please utilize secure chat with additional questions, if there is no response within 30 minutes please call the above phone number. ? ?Palliative Medicine Team providers are available by phone from 7am to 7pm daily and can be reached through the team cell phone.  ?Should this patient require assistance outside of these hours, please call the patient's attending physician.  ? ?

## 2021-05-02 NOTE — Progress Notes (Signed)
After patient tried to get up to use the bathroom again, she was labored in breathing and says it's hard for hard to catch her breath. I instructed patient to stay in bed and I placed a purewick to help with getting up multiple times to go to the bathroom. Provider notified. ?

## 2021-05-02 NOTE — Progress Notes (Addendum)
Please note, pt is not currently enrolled in the Hospice Program at [facility].  She is currently under our [facility].  If she is Hospice appropriate, a referral needs to made for Hospice services.  Feel free to call [phone] or [phone] with any questions regarding this patient.  ?Lin Landsman, RN BSN CHPN ?[facility] ?[facility].  ? ?Addendum:  I have spoke with pt son [name] this afternoon.  He is not ready for patient to be admitted to Hospice services.  He acknowledges his mother's disease is progressing but feels she will have to 'give up' a lot to be on Hospice. Gentle education provided to him about Hospice services.  He states that 'when the time comes', he will be OK for Hospice services, but not now.   ?

## 2021-05-02 NOTE — Progress Notes (Signed)
?PROGRESS NOTE ? ? ?[name]  PFX:[id number] DOB: 09-17-1931 DOA: 05/01/2021 ?PCP: Lajean Manes, MD  ?Brief Narrative:  ? ?88 year old white female from home C/O SOB 48 hours SPO2 EMS 85% ?Inoperable catheter AoS ?Cervical cancer ?CKD stage IIIb ?CABG + CAD + HTN + HLD + HF PEF ?Hypothyroid ?Multifactorial dementia ? ?Last admission 12/2020 = DOE 2/2 cardiogenic causes-diuresed discharged with palliative care follow-up-is followed by [facility] [place] ?  ?Her son gives her h/o additionally-he tells me that she is somewhat mobile but over the past week she has been less so-he usually "catches it" but was not able to catch at this time in terms of her becoming more short of breath-she apparently could not move off of the couch or ambulate and he had to carry her to the restroom as she was short of breath-[facility] [place] does come out weekly with the nurse and social worker to monitor ? ?ED work-up here = BNP >4500 troponin 33 COVID influenza negative Rx Lasix 20 IV ? ?Coreg discontinued ?NAGMA noted ?Continuing GDMT ? ?Hospital-Problem based course ? ?Decompenated HF , 2/2 AoS-severe not operative candidate ?underlying CAD status post PCI ?With goal being comfort related we will continue diuresis to comfort-I have transitioned her from IV to p.o. Lasix 60 twice daily ?I had a long extensive discussion with the patient's son-he voices mistrust in the medical system-he understands however that his mom is clearly worsening and he saw this happen over the course of the past year and in fact last year felt that she was "about to go" ?Hospice is yet to come by-I spoke to them yesterday evening and they will be by later this evening ?I do think that the patient can go home tomorrow if she remains relatively hemodynamically stable with equipment such as DME bed as well as portable oxygen and I have ordered these ?I appreciate palliative care expertise and discussion with the family ?Adult failure to  thrive-BMI 17, cervical cancer ?Nonoperative candidate-comfort feeds as tolerated by he ?Hypothyroid ?For now continue Synthroid 75 ? ? ?DVT prophylaxis: Heparin ?Code Status: DNR-comfort trajectory eventually ?Family Communication: Discussed with son in detail 4/16 on phone-[name]  [phone] ?Disposition:  ?Status is: Observation ?The patient will require care spanning > 2 midnights and should be moved to inpatient because:  ? ?Not ready for discharge ?  ?Consultants:  ?Palliative care ? ?Procedures: No ? ?Antimicrobials: No ? ? ?Subjective: ? ?Patient is short of breath while laying down but is not on oxygen ?She is not eating a whole lot ?She has no chest pain ?I reviewed EKG from admission and compared it with prior 1 in December and it appears that she does have some T wave changes across V5 and V6 but those have been present previously-I do not think she had an NSTEMI ? ?Objective: ?Vitals:  ? 05/01/21 1914 05/02/21 0016 05/02/21 0400 05/02/21 0824  ?BP: 98/64 93/79 105/65 109/86  ?Pulse: 67 72 66 77  ?Resp: 20 17  (!) 21  ?Temp: 97.6 ?F (36.4 ?C) 97.7 ?F (36.5 ?C) 98 ?F (36.7 ?C) 97.8 ?F (36.6 ?C)  ?TempSrc: Oral Oral Oral Oral  ?SpO2: 100% 99% 99% 99%  ?Weight:   40.3 kg   ?Height:      ? ? ?Intake/Output Summary (Last 24 hours) at 05/02/2021 1204 ?Last data filed at 05/02/2021 1000 ?Gross per 24 hour  ?Intake 480 ml  ?Output 200 ml  ?Net 280 ml  ? ?Filed Weights  ? 05/01/21  0154 05/01/21 1532 05/02/21 0400  ?Weight: 40.1 kg 40.3 kg 40.3 kg  ? ? ?Examination: ? ?Cachectic white female bitemporal wasting supraclavicular wasting Z1-I4 with holosystolic murmur CABG scar ?Abdomen soft no rebound no guarding ?No increased work of breathing ?Chest is clear ?Cannot appreciate JVD ? ?Data Reviewed: personally reviewed  ? ?CBC ?   ?Component Value Date/Time  ? WBC 7.9 05/01/2021 0155  ? RBC 3.76 (L) 05/01/2021 0155  ? HGB 12.0 05/01/2021 0155  ? HCT 36.1 05/01/2021 0155  ? PLT 186 05/01/2021 0155  ? MCV 96.0  05/01/2021 0155  ? MCH 31.9 05/01/2021 0155  ? MCHC 33.2 05/01/2021 0155  ? RDW 14.1 05/01/2021 0155  ? LYMPHSABS 1.3 12/20/2020 1301  ? MONOABS 0.6 12/20/2020 1301  ? EOSABS 0.1 12/20/2020 1301  ? BASOSABS 0.1 12/20/2020 1301  ? ? ?  Latest Ref Rng & Units 05/02/2021  ?  4:23 AM 05/01/2021  ?  1:55 AM 12/23/2020  ?  4:01 AM  ?CMP  ?Glucose 70 - 99 mg/dL 110   124   90    ?BUN 8 - 23 mg/dL 40   27   41    ?Creatinine 0.44 - 1.00 mg/dL 1.97   1.61   1.32    ?Sodium 135 - 145 mmol/L 138   140   134    ?Potassium 3.5 - 5.1 mmol/L 3.8   4.8   4.6    ?Chloride 98 - 111 mmol/L 107   111   101    ?CO2 22 - 32 mmol/L '22   19   23    '$ ?Calcium 8.9 - 10.3 mg/dL 8.9   9.6   9.0    ? ? ? ?Radiology Studies: ?DG Chest 2 View ? ?Result Date: 05/01/2021 ?CLINICAL DATA:  Shortness of breath. EXAM: CHEST - 2 VIEW COMPARISON:  December 20, 2020 FINDINGS: Multiple sternal wires and vascular clips are present. Mild atelectasis is seen within the bilateral lung bases. Very small bilateral pleural effusions are noted. No pneumothorax is identified. The cardiac silhouette is moderately enlarged and unchanged in size. There is moderate severity calcification of the aortic arch. Degenerative changes seen throughout the thoracic spine. IMPRESSION: 1. Evidence of prior median sternotomy/CABG. 2. Mild bibasilar atelectasis. 3. Very small bilateral pleural effusions. Electronically Signed   By: Virgina Norfolk M.D.   On: 05/01/2021 02:56   ? ? ?Scheduled Meds: ? aspirin  81 mg Oral Daily  ? carvedilol  6.25 mg Oral BID WC  ? furosemide  60 mg Oral BID  ? heparin  5,000 Units Subcutaneous Q8H  ? levothyroxine  75 mcg Oral QHS  ? ?Continuous Infusions: ? ? LOS: 0 days  ? ?Time spent: 37 ? ?Nita Sells, MD ?[facility] ?To contact the attending provider between 7A-7P or the covering provider during after hours 7P-7A, please log into the web site [email or web address] and access using universal [place] password for that web site. If you do  not have the password, please call the hospital operator. ? ?05/02/2021, 12:04 PM  ? ? ?

## 2021-05-03 DIAGNOSIS — E039 Hypothyroidism, unspecified: Secondary | ICD-10-CM | POA: Diagnosis not present

## 2021-05-03 DIAGNOSIS — E872 Acidosis, unspecified: Secondary | ICD-10-CM | POA: Diagnosis not present

## 2021-05-03 DIAGNOSIS — I5033 Acute on chronic diastolic (congestive) heart failure: Secondary | ICD-10-CM | POA: Diagnosis not present

## 2021-05-03 DIAGNOSIS — E43 Unspecified severe protein-calorie malnutrition: Secondary | ICD-10-CM | POA: Diagnosis not present

## 2021-05-03 DIAGNOSIS — Z7189 Other specified counseling: Secondary | ICD-10-CM | POA: Diagnosis not present

## 2021-05-03 LAB — COMPREHENSIVE METABOLIC PANEL
ALT: 12 U/L (ref 0–44)
AST: 19 U/L (ref 15–41)
Albumin: 3.3 g/dL — ABNORMAL LOW (ref 3.5–5.0)
Alkaline Phosphatase: 31 U/L — ABNORMAL LOW (ref 38–126)
Anion gap: 8 (ref 5–15)
BUN: 53 mg/dL — ABNORMAL HIGH (ref 8–23)
CO2: 21 mmol/L — ABNORMAL LOW (ref 22–32)
Calcium: 9 mg/dL (ref 8.9–10.3)
Chloride: 109 mmol/L (ref 98–111)
Creatinine, Ser: 2.15 mg/dL — ABNORMAL HIGH (ref 0.44–1.00)
GFR, Estimated: 22 mL/min — ABNORMAL LOW (ref >60)
Glucose, Bld: 106 mg/dL — ABNORMAL HIGH (ref 70–99)
Potassium: 3.5 mmol/L (ref 3.5–5.1)
Sodium: 138 mmol/L (ref 135–145)
Total Bilirubin: 1.1 mg/dL (ref 0.3–1.2)
Total Protein: 6 g/dL — ABNORMAL LOW (ref 6.5–8.1)

## 2021-05-03 MED ORDER — FUROSEMIDE 40 MG PO TABS: 40.0000 mg | ORAL_TABLET | Freq: Two times a day (BID) | ORAL | Status: DC

## 2021-05-03 MED ORDER — FUROSEMIDE 40 MG PO TABS: 40.0000 mg | ORAL_TABLET | Freq: Two times a day (BID) | ORAL | 3 refills | Status: DC

## 2021-05-03 MED ORDER — CARVEDILOL 6.25 MG PO TABS: 3.1250 mg | ORAL_TABLET | Freq: Two times a day (BID) | ORAL | 0 refills | Status: DC

## 2021-05-03 NOTE — TOC Transition Note (Addendum)
Transition of Care (TOC) - CM/SW Discharge Note ? ? ?Patient Details  ?Name: [name] ?MRN: [id number] ?Date of Birth: Oct 14, 1931 ? ?Transition of Care (TOC) CM/SW Contact:  ?Zenon Mayo, RN ?Phone Number: ?05/03/2021, 11:02 AM ? ? ?Clinical Narrative:    ?Patient is for dc today, NCM offered choice, she has no preference for [facility], SW.  NCM made referral to [facility] with [facility] Awaiting  to hear back.  She will need home oxygen as well, she is ok with [employer] supplying the oxygen.  NCM made referral to [facility].  NCM also notified Cheri with [facility] that patient will be dc today , she is active with them for outpatient palliative services.  MD to correct oxygen order. ? ? ?Final next level of care: [place] ?Barriers to Discharge: No Barriers Identified ? ? ?Patient Goals and CMS Choice ?Patient states their goals for this hospitalization and ongoing recovery are:: return home with son ?CMS Medicare.gov Compare Post Acute Care list provided to:: Patient ?Choice offered to / list presented to : Patient ? ?Discharge Placement ?  ?           ?  ?  ?  ?  ? ?Discharge Plan and Services ?  ?Discharge Planning Services: CM Consult ?Post Acute Care Choice: Home Health          ?DME Arranged: Oxygen ?DME Agency: [employer] ?Date DME Agency Contacted: 05/03/21 ?Time DME Agency Contacted: 1101 ?Representative spoke with at DME Agency: Thedore Mins ?HH Arranged: Therapist, sports, Social Work ?[place] Agency: [facility] ?Date HH Agency Contacted: 05/03/21 ?Time [place]: 1101 ?Representative spoke with at [place]: Anderson Malta ? ?Social Determinants of Health (SDOH) Interventions ?  ? ? ?Readmission Risk Interventions ?   ? View : No data to display.  ?  ?  ?  ? ? ? ? ? ?

## 2021-05-03 NOTE — Plan of Care (Signed)
?  Problem: Education: ?Goal: Ability to demonstrate management of disease process will improve ?Outcome: Adequate for Discharge ?Goal: Ability to verbalize understanding of medication therapies will improve ?Outcome: Adequate for Discharge ?Goal: Individualized Educational Video(s) ?Outcome: Adequate for Discharge ?  ?

## 2021-05-03 NOTE — Progress Notes (Signed)
Heart Failure Navigator Progress Note ? ?Assessed for Heart & Vascular TOC clinic readiness.  ?Patient does not meet criteria due to patient comfort care, hospice. .  ? ? ? ?Earnestine Leys, BSN, RN ?Heart Failure Nurse Navigator ?Secure Chat Only   ?

## 2021-05-03 NOTE — Progress Notes (Signed)
SATURATION QUALIFICATIONS: (This note is used to comply with regulatory documentation for home oxygen) ? ?Patient Saturations on Room Air at Rest = 100% ? ?Patient Saturations on Room Air while Ambulating = 86% ? ?Patient Saturations on 2 Liters of oxygen while Ambulating = 98% ? ?Please briefly explain why patient needs home oxygen:  ?Patient ambulated 10 feet in room and had to sit down. Stated she was weak and could not go further. Patient had increased work of breathing while ambulating. O2 sats decreased to 86% on RA. 2L  applied to reach O2 sat of 98%. ?

## 2021-05-03 NOTE — Discharge Summary (Signed)
Physician Discharge Summary  ?[name] RXV:[id number] DOB: 11/22/1931 DOA: 05/01/2021 ? ?PCP: Lajean Manes, MD ? ?Admit date: 05/01/2021 ?Discharge date: 05/03/2021 ? ?Time spent: 27 minutes ? ?Recommendations for Outpatient Follow-up:  ?Will need [facility] [place] to reassess the patient, draw labs and report them to Dr. Felipa Eth in about a week as she is on a higher dose of Lasix ?Known dosage adjustment of Coreg downwards ?Overall poor prognosis-patient understands the same-son not ready to call in hospice but palliative will serve as a bridge until then ? ?Discharge Diagnoses:  ?MAIN problem for hospitalization  ? ?Decompensated acute heart failure secondary to inoperable AOS ? ?Please see below for itemized issues addressed in HOpsital- ?refer to other progress notes for clarity if needed ? ?Discharge Condition: Guarded ? ?Diet recommendation: Heart healthy ? ?Filed Weights  ? 05/01/21 1532 05/02/21 0400 05/03/21 0223  ?Weight: 40.3 kg 40.3 kg 40.1 kg  ? ? ?History of present illness:  ?88 year old white female from home C/O SOB 48 hours SPO2 EMS 85% ?Inoperable catheter AoS ?Cervical cancer ?CKD stage IIIb ?CABG + CAD + HTN + HLD + HF PEF ?Hypothyroid ?Multifactorial dementia which is mild ? ?Last admission 12/2020 = DOE 2/2 cardiogenic causes-diuresed discharged with palliative care follow-up-is followed by [facility] [place] ?  ?Her son gives her h/o additionally-he tells me that she is somewhat mobile but over the past week she has been less so-he usually "catches it" but was not able to catch at this time in terms of her becoming more short of breath-she apparently could not move off of the couch or ambulate and he had to carry her to the restroom as she was short of breath-[facility] [place] does come out weekly with the nurse and social worker to monitor ? ?ED work-up here = BNP >4500 troponin 33 COVID influenza negative Rx Lasix 20 IV ? ?Coreg discontinued ?NAGMA noted ?Continuing  GDMT ? ?Hospital Course:  ?Decompenated HF , 2/2 AoS-severe not operative candidate ?underlying CAD status post PCI ?I had a long extensive discussion with the patient's son-he voices mistrust in the medical system-he understands however that his mom is clearly worsening and he saw this happen over the course of the past year and in fact last year felt that she was "about to go" ?Hospice is yet to come by-I spoke to them yesterday evening and they will be by later this evening ?Patient was placed on discharge on 40 mg of Lasix p.o. twice daily and will need labs in about a week as her creatinine probably will rise-at that time they can discuss with either hospice physician or primary care physician next steps ?Adult failure to thrive-BMI 17, cervical cancer ?Nonoperative candidate-comfort feeds as tolerated by he ?Hypothyroid ?For now continue Synthroid 75 ?  ? ?Discharge Exam: ?Vitals:  ? 05/03/21 0325 05/03/21 0724  ?BP: 103/69 103/68  ?Pulse: 77 72  ?Resp: (!) 22 (!) 24  ?Temp: 98.7 ?F (37.1 ?C) 97.6 ?F (36.4 ?C)  ?SpO2: 100% 100%  ? ? ?Subj on day of d/c ?  ?Awake coherent less short of breath ?Able to lie more flat but still a little winded overnight ? ?General Exam on discharge ? ?EOMI NCAT no focal deficit no rales no rhonchi no wheeze ?Sinus tachycardia regular rate rhythm ?Abdomen soft no rebound no guarding ?Neurologically intact no focal deficit ? ? ?Discharge Instructions ? ? ?Discharge Instructions   ? ? Diet - low sodium heart healthy   Complete by:  As directed ?  ? Discharge instructions   Complete by: As directed ?  ? Make sure you take the Lasix at the higher dose that you had been placed on--- I would recommend that you continue to use this dose for at least a week and then make sure you have labs done in the outpatient setting ?We will ask home health/[facility] [place] to draw labs and report them to your primary care physician ?Please follow-up in the outpatient setting with [facility] the  [facility]-they should look in on you and further help you plan thanks ?We have cut back your dose of Coreg as per orders  ? Increase activity slowly   Complete by: As directed ?  ? ?  ? ?Allergies as of 05/03/2021   ? ?   Reactions  ? Aleve [naproxen Sodium] Hives  ? Aleve [naproxen]   ? Other reaction(s): Unknown  ? Crestor [rosuvastatin]   ? Other reaction(s): lethargy  ? Excedrin Extra Strength [aspirin-acetaminophen-caffeine] Nausea And Vomiting  ? Meloxicam   ? Other reaction(s): felt poorly  ? Statins Other (See Comments)  ? No energy, very weak  ? Vytorin [ezetimibe-simvastatin] Other (See Comments)  ? Fatigue  ? ?  ? ?  ?Medication List  ?  ? ?STOP taking these medications   ? ?amLODipine 5 MG tablet ?Commonly known as: NORVASC ?  ? ?  ? ?TAKE these medications   ? ?acetaminophen 500 MG tablet ?Commonly known as: TYLENOL ?Take 1,000 mg by mouth every 6 (six) hours as needed for mild pain. ?  ?aspirin 81 MG chewable tablet ?Chew 81 mg by mouth daily. ?  ?carvedilol 6.25 MG tablet ?Commonly known as: COREG ?Take 0.5 tablets (3.125 mg total) by mouth 2 (two) times daily with a meal. ?What changed: how much to take ?  ?ferrous sulfate 325 (65 FE) MG tablet ?Take 325 mg by mouth at bedtime. ?  ?furosemide 40 MG tablet ?Commonly known as: LASIX ?Take 1 tablet (40 mg total) by mouth 2 (two) times daily. ?What changed:  ?medication strength ?how much to take ?when to take this ?reasons to take this ?  ?levothyroxine 75 MCG tablet ?Commonly known as: SYNTHROID ?Take 75 mcg by mouth at bedtime. ?  ?multivitamin with minerals Tabs tablet ?Take 1 tablet by mouth daily. ?  ?vitamin B-12 250 MCG tablet ?Commonly known as: CYANOCOBALAMIN ?Take 250 mcg by mouth daily. ?  ?Vitamin D-3 25 MCG (1000 UT) Caps ?Take 1,000 Units by mouth daily. ?  ? ?  ? ?Allergies  ?Allergen Reactions  ? Aleve [Naproxen Sodium] Hives  ? Aleve [Naproxen]   ?  Other reaction(s): Unknown  ? Crestor [Rosuvastatin]   ?  Other reaction(s): lethargy  ?  Excedrin Extra Strength [Aspirin-Acetaminophen-Caffeine] Nausea And Vomiting  ? Meloxicam   ?  Other reaction(s): felt poorly  ? Statins Other (See Comments)  ?  No energy, very weak  ? Vytorin [Ezetimibe-Simvastatin] Other (See Comments)  ?  Fatigue ?  ? ? ? ? ?The results of significant diagnostics from this hospitalization (including imaging, microbiology, ancillary and laboratory) are listed below for reference.   ? ?Significant Diagnostic Studies: ?DG Chest 2 View ? ?Result Date: 05/01/2021 ?CLINICAL DATA:  Shortness of breath. EXAM: CHEST - 2 VIEW COMPARISON:  December 20, 2020 FINDINGS: Multiple sternal wires and vascular clips are present. Mild atelectasis is seen within the bilateral lung bases. Very small bilateral pleural effusions are noted. No pneumothorax is identified. The cardiac silhouette  is moderately enlarged and unchanged in size. There is moderate severity calcification of the aortic arch. Degenerative changes seen throughout the thoracic spine. IMPRESSION: 1. Evidence of prior median sternotomy/CABG. 2. Mild bibasilar atelectasis. 3. Very small bilateral pleural effusions. Electronically Signed   By: Virgina Norfolk M.D.   On: 05/01/2021 02:56   ? ?Microbiology: ?Recent Results (from the past 240 hour(s))  ?Resp Panel by RT-PCR (Flu A&B, Covid) Nasopharyngeal Swab     Status: None  ? Collection Time: 05/01/21  2:25 AM  ? Specimen: Nasopharyngeal Swab; Nasopharyngeal(NP) swabs in vial transport medium  ?Result Value Ref Range Status  ? SARS Coronavirus 2 by RT PCR NEGATIVE NEGATIVE Final  ?  Comment: (NOTE) ?SARS-CoV-2 target nucleic acids are NOT DETECTED. ? ?The SARS-CoV-2 RNA is generally detectable in upper respiratory ?specimens during the acute phase of infection. The lowest ?concentration of SARS-CoV-2 viral copies this assay can detect is ?138 copies/mL. A negative result does not preclude SARS-Cov-2 ?infection and should not be used as the sole basis for treatment or ?other patient  management decisions. A negative result may occur with  ?improper specimen collection/handling, submission of specimen other ?than nasopharyngeal swab, presence of viral mutation(s) within the ?areas targ

## 2021-05-04 DIAGNOSIS — E872 Acidosis, unspecified: Secondary | ICD-10-CM | POA: Diagnosis not present

## 2021-05-04 DIAGNOSIS — E43 Unspecified severe protein-calorie malnutrition: Secondary | ICD-10-CM | POA: Diagnosis not present

## 2021-05-04 DIAGNOSIS — E039 Hypothyroidism, unspecified: Secondary | ICD-10-CM | POA: Diagnosis not present

## 2021-05-04 DIAGNOSIS — I5033 Acute on chronic diastolic (congestive) heart failure: Secondary | ICD-10-CM | POA: Diagnosis not present

## 2021-05-04 DIAGNOSIS — Z7189 Other specified counseling: Secondary | ICD-10-CM | POA: Diagnosis not present

## 2021-05-06 DIAGNOSIS — N1832 Chronic kidney disease, stage 3b: Secondary | ICD-10-CM | POA: Diagnosis not present

## 2021-05-06 DIAGNOSIS — I5020 Unspecified systolic (congestive) heart failure: Secondary | ICD-10-CM | POA: Diagnosis not present

## 2021-05-06 DIAGNOSIS — E039 Hypothyroidism, unspecified: Secondary | ICD-10-CM | POA: Diagnosis not present

## 2021-05-06 DIAGNOSIS — Z955 Presence of coronary angioplasty implant and graft: Secondary | ICD-10-CM | POA: Diagnosis not present

## 2021-05-06 DIAGNOSIS — K219 Gastro-esophageal reflux disease without esophagitis: Secondary | ICD-10-CM | POA: Diagnosis not present

## 2021-05-06 DIAGNOSIS — I130 Hypertensive heart and chronic kidney disease with heart failure and stage 1 through stage 4 chronic kidney disease, or unspecified chronic kidney disease: Secondary | ICD-10-CM | POA: Diagnosis not present

## 2021-05-06 DIAGNOSIS — E785 Hyperlipidemia, unspecified: Secondary | ICD-10-CM | POA: Diagnosis not present

## 2021-05-06 DIAGNOSIS — I2510 Atherosclerotic heart disease of native coronary artery without angina pectoris: Secondary | ICD-10-CM | POA: Diagnosis not present

## 2021-05-11 DIAGNOSIS — E039 Hypothyroidism, unspecified: Secondary | ICD-10-CM | POA: Diagnosis not present

## 2021-05-11 DIAGNOSIS — K219 Gastro-esophageal reflux disease without esophagitis: Secondary | ICD-10-CM | POA: Diagnosis not present

## 2021-05-11 DIAGNOSIS — I5020 Unspecified systolic (congestive) heart failure: Secondary | ICD-10-CM | POA: Diagnosis not present

## 2021-05-11 DIAGNOSIS — E785 Hyperlipidemia, unspecified: Secondary | ICD-10-CM | POA: Diagnosis not present

## 2021-05-11 DIAGNOSIS — N1832 Chronic kidney disease, stage 3b: Secondary | ICD-10-CM | POA: Diagnosis not present

## 2021-05-11 DIAGNOSIS — I2510 Atherosclerotic heart disease of native coronary artery without angina pectoris: Secondary | ICD-10-CM | POA: Diagnosis not present

## 2021-05-11 DIAGNOSIS — Z955 Presence of coronary angioplasty implant and graft: Secondary | ICD-10-CM | POA: Diagnosis not present

## 2021-05-11 DIAGNOSIS — I130 Hypertensive heart and chronic kidney disease with heart failure and stage 1 through stage 4 chronic kidney disease, or unspecified chronic kidney disease: Secondary | ICD-10-CM | POA: Diagnosis not present

## 2021-05-11 NOTE — Progress Notes (Deleted)
Evaluation Performed:  Follow-up visit  Date:  05/11/2021   ID:  [name], DOB 10/25/1931, MRN [id number]  Provider Location: Office  PCP:  Lajean Manes, MD  Cardiologist:  Jenkins Rouge, MD   Electrophysiologist:  None   Chief Complaint:  CAD/AS  History of Present Illness:    [name] is an 88 y.o. female with a history of  ASCAD s/p CABG, HTN, normal LVF, dyslipidemia and mild AS     CABG 2004 with LIMA to LAD SVG to Diagonal SVG OM and SVG PDA   No palpitations syncope or chest pain   I took care of her 2nd husband [name] Sister [name] is a patient of mine as well   Last echo 11/28/19 EF normal mean gradient 2 peak 69 mmHg DVI o.13 AVA 0.51   She has advanced dementia and is not a TAVR candidate She has 3 boys one [name] lives with her and one lives close with her sister [name] They need to have family conference to consider assisted living for her   Admitted with CHF 06/27/20 BNP 4500 R/O Rx with iv lasix  Was not sent home on diuretic   Been fine since d/c not needing Lasix  She is a DNR confirmed with son with her today    Past Medical History:  Diagnosis Date   Anemia    Aortic stenosis    mild AS by 02/18/11  [name])   Arthritis    Bradycardia    Asymtomatic resolved   Cancer ([place])    ceervical   Chronic renal disease, stage III ([place])    Coronary artery disease    CABG 2003   Dyslipidemia    goal LSL less then 70 but statin intolerant   GERD (gastroesophageal reflux disease)    Hypertension    Hypothyroidism    Iron deficiency anemia 07/18/2010   Mild AI (aortic insufficiency)    Mild aortic stenosis    OP (osteoporosis) 10/18/2010   fosamax   Statin intolerance    Past Surgical History:  Procedure Laterality Date   ABDOMINAL HYSTERECTOMY     CARDIAC CATHETERIZATION     2004   cataracts     COLONOSCOPY  09/2010   normal EGD abd COLONOSCOPY   CORONARY ARTERY BYPASS GRAFT  2004   with LIMA to LAD, SVG to diagonal,  SVG to OM, SOVG to PDA normal LVF   HIP ARTHROPLASTY Left 03/23/2020   Procedure: ARTHROPLASTY BIPOLAR HIP (HEMIARTHROPLASTY) anterior approach;  Surgeon: Rod Can, MD;  Location: [facility];  Service: Orthopedics;  Laterality: Left;   LUMBAR LAMINECTOMY  09/23/2011   Procedure: MICRODISCECTOMY LUMBAR LAMINECTOMY;  Surgeon: Marybelle Killings, MD;  Location: [facility];  Service: Orthopedics;  Laterality: N/A;  L4-5 Decompression     No outpatient medications have been marked as taking for the 05/20/21 encounter (Appointment) with Joann Hector, MD.     Allergies:   Aleve [naproxen sodium], Aleve [naproxen], Crestor [rosuvastatin], Excedrin extra strength [aspirin-acetaminophen-caffeine], Meloxicam, Statins, and Vytorin [ezetimibe-simvastatin]   Social History   Tobacco Use   Smoking status: Never   Smokeless tobacco: Never  Vaping Use   Vaping Use: Never used  Substance Use Topics   Alcohol use: No   Drug use: No     Family Hx: The patient's family history includes Asthma in her father; Heart Problems in her mother.  ROS:   Please see the history of present illness.     All  other systems reviewed and are negative.   Prior CV studies:   The following studies were reviewed today: Echo 01/30/17  Echo 10/04/18  Echo 04/30/19   Labs/Other Tests and Data Reviewed:    EKG:   04/07/16 SR rate 58 normal SR read as flutter but artifact rate 70 normal   Recent Labs: 06/26/2020: TSH 4.457 05/01/2021: B Natriuretic Peptide >4,500.0; Hemoglobin 12.0; Platelets 186 05/03/2021: ALT 12; BUN 53; Creatinine, Ser 2.15; Potassium 3.5; Sodium 138   Recent Lipid Panel Lab Results  Component Value Date/Time   CHOL 208 (H) 03/24/2014 10:08 AM   TRIG 129.0 03/24/2014 10:08 AM   HDL 44.80 03/24/2014 10:08 AM   CHOLHDL 5 03/24/2014 10:08 AM   LDLCALC 137 (H) 03/24/2014 10:08 AM    Wt Readings from Last 3 Encounters:  05/03/21 88 lb 6.4 oz (40.1 kg)  12/23/20 88 lb 6.5 oz (40.1 kg)  08/14/20 94 lb  (42.6 kg)     Objective:    Vital Signs:  There were no vitals taken for this visit.  Affect appropriate Healthy:  appears stated age 6: normal Neck supple with no adenopathy JVP normal left  bruits no thyromegaly Lungs clear with no wheezing and good diaphragmatic motion Heart:  S1/S2 AS  murmur, no rub, gallop or click PMI normal Abdomen: benighn, BS positve, no tenderness, no AAA no bruit.  No HSM or HJR Distal pulses intact with no bruits No edema Neuro non-focal Skin warm and dry Right TKR    ASSESSMENT & PLAN:    AS:  Severe mean gradient  53 mmHg by TTE 11/28/19  given age and advanced dementia not a candidate for TAVR will prescribe Lasix 20 mg PO PRN for dyspnea, weight gain or edema  CAD/CABG:  2004 no chest pain observe given age and lack of symptoms HLD:  On zetia labs with primary Thyroid:  On replacement labs with primary   COVID-19 Education: The signs and symptoms of COVID-19 were discussed with the patient and how to seek care for testing (follow up with PCP or arrange E-visit).  The importance of social distancing was discussed today.  Time:   Today, I have spent 30 minutes with the patient     Medication Adjustments/Labs and Tests Ordered: Current medicines are reviewed at length with the patient today.  Concerns regarding medicines are outlined above.  Tests Ordered:  None   Medication Changes:  Lasix 20 mg PO PRN   Disposition:  Follow up in a year   Signed, Jenkins Rouge, MD  05/11/2021 12:12 PM    [place]

## 2021-05-12 DIAGNOSIS — K219 Gastro-esophageal reflux disease without esophagitis: Secondary | ICD-10-CM | POA: Diagnosis not present

## 2021-05-12 DIAGNOSIS — I5020 Unspecified systolic (congestive) heart failure: Secondary | ICD-10-CM | POA: Diagnosis not present

## 2021-05-12 DIAGNOSIS — E039 Hypothyroidism, unspecified: Secondary | ICD-10-CM | POA: Diagnosis not present

## 2021-05-12 DIAGNOSIS — E785 Hyperlipidemia, unspecified: Secondary | ICD-10-CM | POA: Diagnosis not present

## 2021-05-12 DIAGNOSIS — Z955 Presence of coronary angioplasty implant and graft: Secondary | ICD-10-CM | POA: Diagnosis not present

## 2021-05-12 DIAGNOSIS — I2510 Atherosclerotic heart disease of native coronary artery without angina pectoris: Secondary | ICD-10-CM | POA: Diagnosis not present

## 2021-05-12 DIAGNOSIS — N1832 Chronic kidney disease, stage 3b: Secondary | ICD-10-CM | POA: Diagnosis not present

## 2021-05-12 DIAGNOSIS — I130 Hypertensive heart and chronic kidney disease with heart failure and stage 1 through stage 4 chronic kidney disease, or unspecified chronic kidney disease: Secondary | ICD-10-CM | POA: Diagnosis not present

## 2021-05-14 DIAGNOSIS — K219 Gastro-esophageal reflux disease without esophagitis: Secondary | ICD-10-CM | POA: Diagnosis not present

## 2021-05-14 DIAGNOSIS — E039 Hypothyroidism, unspecified: Secondary | ICD-10-CM | POA: Diagnosis not present

## 2021-05-14 DIAGNOSIS — I2510 Atherosclerotic heart disease of native coronary artery without angina pectoris: Secondary | ICD-10-CM | POA: Diagnosis not present

## 2021-05-14 DIAGNOSIS — N1832 Chronic kidney disease, stage 3b: Secondary | ICD-10-CM | POA: Diagnosis not present

## 2021-05-14 DIAGNOSIS — Z955 Presence of coronary angioplasty implant and graft: Secondary | ICD-10-CM | POA: Diagnosis not present

## 2021-05-14 DIAGNOSIS — I130 Hypertensive heart and chronic kidney disease with heart failure and stage 1 through stage 4 chronic kidney disease, or unspecified chronic kidney disease: Secondary | ICD-10-CM | POA: Diagnosis not present

## 2021-05-14 DIAGNOSIS — E785 Hyperlipidemia, unspecified: Secondary | ICD-10-CM | POA: Diagnosis not present

## 2021-05-14 DIAGNOSIS — I5020 Unspecified systolic (congestive) heart failure: Secondary | ICD-10-CM | POA: Diagnosis not present

## 2021-05-16 DIAGNOSIS — I110 Hypertensive heart disease with heart failure: Secondary | ICD-10-CM | POA: Diagnosis not present

## 2021-05-20 ENCOUNTER — Ambulatory Visit: Payer: Medicare Other | Admitting: Cardiovascular Disease

## 2021-05-26 ENCOUNTER — Telehealth: Payer: Self-pay | Admitting: *Deleted

## 2021-05-26 NOTE — Telephone Encounter (Signed)
? ?  Name: [name]  ?DOB: Jun 27, 1931  ?MRN: [id number] ? ?Primary Cardiologist: Jenkins Rouge, MD ? ?Chart reviewed as part of pre-operative protocol coverage. Because of [name]'s past medical history and time since last visit, she will require a follow-up in-office visit in order to better assess preoperative cardiovascular risk. ? ?Pre-op covering staff: ?- Please schedule appointment and call patient to inform them. If patient already had an upcoming appointment within acceptable timeframe, please add "pre-op clearance" to the appointment notes so provider is aware. ?- Please contact requesting surgeon's office via preferred method (i.e, phone, fax) to inform them of need for appointment prior to surgery. ? ? ?Almyra Deforest, [place]  ?05/26/2021, 6:21 PM  ? ?

## 2021-05-26 NOTE — Telephone Encounter (Signed)
? ?  Pre-operative Risk Assessment  ?  ?Patient Name: [name]  ?DOB: 1931-12-02 ?MRN: [id number]  ? ? ? ?Request for Surgical Clearance   ? ?Procedure:  Dental Extraction - Amount of Teeth to be Pulled:  9 TEETH TO BE EXTRACTED (PER CLEARANCE FORM TOOTH #'s 6, 8, 9, 10, 11, 17, 21, 27,28) ? ?Date of Surgery:  Clearance TBD                              ?   ?Surgeon: DR. Clent Demark ?Surgeon's Group or Practice Name:  [facility] ?Phone number:  [phone] ?Fax number:  [phone] ?  ?Type of Clearance Requested:   ?- Medical PT IS ON ASA  ?  ?Type of Anesthesia:  Local  ?  ?Additional requests/questions:   ? ?Signed, ?Julaine Hua   ?05/26/2021, 12:32 PM  ? ?

## 2021-05-27 NOTE — Telephone Encounter (Signed)
Left message for the pt to call back to schedule an IN OFFICE appt with Dr. Johnsie Cancel or APP for pre op clearance.  ?

## 2021-05-28 ENCOUNTER — Encounter: Payer: Self-pay | Admitting: *Deleted

## 2021-05-28 DIAGNOSIS — E785 Hyperlipidemia, unspecified: Secondary | ICD-10-CM | POA: Diagnosis not present

## 2021-05-28 DIAGNOSIS — I5020 Unspecified systolic (congestive) heart failure: Secondary | ICD-10-CM | POA: Diagnosis not present

## 2021-05-28 DIAGNOSIS — N1832 Chronic kidney disease, stage 3b: Secondary | ICD-10-CM | POA: Diagnosis not present

## 2021-05-28 DIAGNOSIS — K219 Gastro-esophageal reflux disease without esophagitis: Secondary | ICD-10-CM | POA: Diagnosis not present

## 2021-05-28 DIAGNOSIS — I130 Hypertensive heart and chronic kidney disease with heart failure and stage 1 through stage 4 chronic kidney disease, or unspecified chronic kidney disease: Secondary | ICD-10-CM | POA: Diagnosis not present

## 2021-05-28 DIAGNOSIS — Z955 Presence of coronary angioplasty implant and graft: Secondary | ICD-10-CM | POA: Diagnosis not present

## 2021-05-28 DIAGNOSIS — E039 Hypothyroidism, unspecified: Secondary | ICD-10-CM | POA: Diagnosis not present

## 2021-05-28 DIAGNOSIS — I2510 Atherosclerotic heart disease of native coronary artery without angina pectoris: Secondary | ICD-10-CM | POA: Diagnosis not present

## 2021-05-28 NOTE — Telephone Encounter (Signed)
Left message x 3 to call for appt. I will send out letter to pt today to call the office. I will update the requesting office. Will also remove from the pre op call back. Will re-address once pt calls back for appt in the office.  ?

## 2021-05-28 NOTE — Telephone Encounter (Signed)
Left message x 2 for pt to call the office for IN OFFICE appt. I will send FYI to requesting office that the pt needs an appt and she has not called back yet.  ?

## 2021-06-02 DIAGNOSIS — E872 Acidosis, unspecified: Secondary | ICD-10-CM | POA: Diagnosis not present

## 2021-06-02 DIAGNOSIS — Z7189 Other specified counseling: Secondary | ICD-10-CM | POA: Diagnosis not present

## 2021-06-02 DIAGNOSIS — E039 Hypothyroidism, unspecified: Secondary | ICD-10-CM | POA: Diagnosis not present

## 2021-06-02 DIAGNOSIS — E43 Unspecified severe protein-calorie malnutrition: Secondary | ICD-10-CM | POA: Diagnosis not present

## 2021-06-02 DIAGNOSIS — I5033 Acute on chronic diastolic (congestive) heart failure: Secondary | ICD-10-CM | POA: Diagnosis not present

## 2021-06-03 DIAGNOSIS — E785 Hyperlipidemia, unspecified: Secondary | ICD-10-CM | POA: Diagnosis not present

## 2021-06-03 DIAGNOSIS — I5033 Acute on chronic diastolic (congestive) heart failure: Secondary | ICD-10-CM | POA: Diagnosis not present

## 2021-06-03 DIAGNOSIS — Z7189 Other specified counseling: Secondary | ICD-10-CM | POA: Diagnosis not present

## 2021-06-03 DIAGNOSIS — N1832 Chronic kidney disease, stage 3b: Secondary | ICD-10-CM | POA: Diagnosis not present

## 2021-06-03 DIAGNOSIS — Z955 Presence of coronary angioplasty implant and graft: Secondary | ICD-10-CM | POA: Diagnosis not present

## 2021-06-03 DIAGNOSIS — K219 Gastro-esophageal reflux disease without esophagitis: Secondary | ICD-10-CM | POA: Diagnosis not present

## 2021-06-03 DIAGNOSIS — I130 Hypertensive heart and chronic kidney disease with heart failure and stage 1 through stage 4 chronic kidney disease, or unspecified chronic kidney disease: Secondary | ICD-10-CM | POA: Diagnosis not present

## 2021-06-03 DIAGNOSIS — I2510 Atherosclerotic heart disease of native coronary artery without angina pectoris: Secondary | ICD-10-CM | POA: Diagnosis not present

## 2021-06-03 DIAGNOSIS — I5020 Unspecified systolic (congestive) heart failure: Secondary | ICD-10-CM | POA: Diagnosis not present

## 2021-06-03 DIAGNOSIS — E872 Acidosis, unspecified: Secondary | ICD-10-CM | POA: Diagnosis not present

## 2021-06-03 DIAGNOSIS — E43 Unspecified severe protein-calorie malnutrition: Secondary | ICD-10-CM | POA: Diagnosis not present

## 2021-06-03 DIAGNOSIS — E039 Hypothyroidism, unspecified: Secondary | ICD-10-CM | POA: Diagnosis not present

## 2021-06-10 DIAGNOSIS — E785 Hyperlipidemia, unspecified: Secondary | ICD-10-CM | POA: Diagnosis not present

## 2021-06-10 DIAGNOSIS — E039 Hypothyroidism, unspecified: Secondary | ICD-10-CM | POA: Diagnosis not present

## 2021-06-10 DIAGNOSIS — K219 Gastro-esophageal reflux disease without esophagitis: Secondary | ICD-10-CM | POA: Diagnosis not present

## 2021-06-10 DIAGNOSIS — I130 Hypertensive heart and chronic kidney disease with heart failure and stage 1 through stage 4 chronic kidney disease, or unspecified chronic kidney disease: Secondary | ICD-10-CM | POA: Diagnosis not present

## 2021-06-10 DIAGNOSIS — N1832 Chronic kidney disease, stage 3b: Secondary | ICD-10-CM | POA: Diagnosis not present

## 2021-06-10 DIAGNOSIS — I5020 Unspecified systolic (congestive) heart failure: Secondary | ICD-10-CM | POA: Diagnosis not present

## 2021-06-10 DIAGNOSIS — I2510 Atherosclerotic heart disease of native coronary artery without angina pectoris: Secondary | ICD-10-CM | POA: Diagnosis not present

## 2021-06-10 DIAGNOSIS — Z955 Presence of coronary angioplasty implant and graft: Secondary | ICD-10-CM | POA: Diagnosis not present

## 2021-06-16 DIAGNOSIS — K219 Gastro-esophageal reflux disease without esophagitis: Secondary | ICD-10-CM | POA: Diagnosis not present

## 2021-06-16 DIAGNOSIS — N1832 Chronic kidney disease, stage 3b: Secondary | ICD-10-CM | POA: Diagnosis not present

## 2021-06-16 DIAGNOSIS — E785 Hyperlipidemia, unspecified: Secondary | ICD-10-CM | POA: Diagnosis not present

## 2021-06-16 DIAGNOSIS — I2510 Atherosclerotic heart disease of native coronary artery without angina pectoris: Secondary | ICD-10-CM | POA: Diagnosis not present

## 2021-06-16 DIAGNOSIS — I5020 Unspecified systolic (congestive) heart failure: Secondary | ICD-10-CM | POA: Diagnosis not present

## 2021-06-16 DIAGNOSIS — I5032 Chronic diastolic (congestive) heart failure: Secondary | ICD-10-CM | POA: Diagnosis not present

## 2021-06-16 DIAGNOSIS — I110 Hypertensive heart disease with heart failure: Secondary | ICD-10-CM | POA: Diagnosis not present

## 2021-06-16 DIAGNOSIS — I130 Hypertensive heart and chronic kidney disease with heart failure and stage 1 through stage 4 chronic kidney disease, or unspecified chronic kidney disease: Secondary | ICD-10-CM | POA: Diagnosis not present

## 2021-06-16 DIAGNOSIS — Z955 Presence of coronary angioplasty implant and graft: Secondary | ICD-10-CM | POA: Diagnosis not present

## 2021-06-16 DIAGNOSIS — E039 Hypothyroidism, unspecified: Secondary | ICD-10-CM | POA: Diagnosis not present

## 2021-06-22 DIAGNOSIS — Z79899 Other long term (current) drug therapy: Secondary | ICD-10-CM | POA: Diagnosis not present

## 2021-06-28 ENCOUNTER — Telehealth: Payer: Self-pay | Admitting: Cardiovascular Disease

## 2021-06-28 NOTE — Telephone Encounter (Signed)
Pt's son returning call regarding Pre - Op appt.Please advise

## 2021-06-30 NOTE — Telephone Encounter (Signed)
See clearance notes  

## 2021-06-30 NOTE — Telephone Encounter (Signed)
Left message for pt or her son to call back to schedule an in office appt. I do believe I left message for telephone appt though that was an error on my part. Pt will need IN OFFICE APPT for pre op clearance.

## 2021-07-01 NOTE — Telephone Encounter (Signed)
Left message again today that we were trying to reach back with the pt to make an appt IN OFFICE for pre op clearance. I will update the requesting office we still have not been able to reach the pt. I will remove from the pre op call back pool.

## 2021-07-03 DIAGNOSIS — E039 Hypothyroidism, unspecified: Secondary | ICD-10-CM | POA: Diagnosis not present

## 2021-07-03 DIAGNOSIS — E872 Acidosis, unspecified: Secondary | ICD-10-CM | POA: Diagnosis not present

## 2021-07-03 DIAGNOSIS — Z7189 Other specified counseling: Secondary | ICD-10-CM | POA: Diagnosis not present

## 2021-07-03 DIAGNOSIS — E43 Unspecified severe protein-calorie malnutrition: Secondary | ICD-10-CM | POA: Diagnosis not present

## 2021-07-03 DIAGNOSIS — I5033 Acute on chronic diastolic (congestive) heart failure: Secondary | ICD-10-CM | POA: Diagnosis not present

## 2021-07-04 DIAGNOSIS — E039 Hypothyroidism, unspecified: Secondary | ICD-10-CM | POA: Diagnosis not present

## 2021-07-04 DIAGNOSIS — E872 Acidosis, unspecified: Secondary | ICD-10-CM | POA: Diagnosis not present

## 2021-07-04 DIAGNOSIS — I5033 Acute on chronic diastolic (congestive) heart failure: Secondary | ICD-10-CM | POA: Diagnosis not present

## 2021-07-04 DIAGNOSIS — E43 Unspecified severe protein-calorie malnutrition: Secondary | ICD-10-CM | POA: Diagnosis not present

## 2021-07-04 DIAGNOSIS — Z7189 Other specified counseling: Secondary | ICD-10-CM | POA: Diagnosis not present

## 2021-07-12 ENCOUNTER — Encounter: Payer: Self-pay | Admitting: Nurse Practitioner

## 2021-07-12 ENCOUNTER — Ambulatory Visit: Payer: Medicare Other | Admitting: Nurse Practitioner

## 2021-07-12 VITALS — BP 100/64 | HR 69 | Ht 60.0 in | Wt 95.8 lb

## 2021-07-12 DIAGNOSIS — I350 Nonrheumatic aortic (valve) stenosis: Secondary | ICD-10-CM | POA: Diagnosis not present

## 2021-07-12 DIAGNOSIS — Z01818 Encounter for other preprocedural examination: Secondary | ICD-10-CM | POA: Diagnosis not present

## 2021-07-12 MED ORDER — CARVEDILOL 3.125 MG PO TABS: 3.1250 mg | ORAL_TABLET | Freq: Two times a day (BID) | ORAL | 3 refills | Status: DC

## 2021-07-15 NOTE — Telephone Encounter (Signed)
Pt's son is requesting updated information be faxed to the surgeon so they can set a schedule.    Fax # for surgeon [phone]

## 2021-07-15 NOTE — Telephone Encounter (Signed)
Returned call, pt needed clearance sent over to surgeon's office.  I sent Christen Bame, NP's note from office visit.

## 2021-07-16 DIAGNOSIS — I110 Hypertensive heart disease with heart failure: Secondary | ICD-10-CM | POA: Diagnosis not present

## 2021-07-16 DIAGNOSIS — I5032 Chronic diastolic (congestive) heart failure: Secondary | ICD-10-CM | POA: Diagnosis not present

## 2021-08-02 DIAGNOSIS — I5033 Acute on chronic diastolic (congestive) heart failure: Secondary | ICD-10-CM | POA: Diagnosis not present

## 2021-08-02 DIAGNOSIS — E039 Hypothyroidism, unspecified: Secondary | ICD-10-CM | POA: Diagnosis not present

## 2021-08-02 DIAGNOSIS — E43 Unspecified severe protein-calorie malnutrition: Secondary | ICD-10-CM | POA: Diagnosis not present

## 2021-08-02 DIAGNOSIS — Z7189 Other specified counseling: Secondary | ICD-10-CM | POA: Diagnosis not present

## 2021-08-02 DIAGNOSIS — E872 Acidosis, unspecified: Secondary | ICD-10-CM | POA: Diagnosis not present

## 2021-08-03 DIAGNOSIS — I5033 Acute on chronic diastolic (congestive) heart failure: Secondary | ICD-10-CM | POA: Diagnosis not present

## 2021-08-03 DIAGNOSIS — Z7189 Other specified counseling: Secondary | ICD-10-CM | POA: Diagnosis not present

## 2021-08-03 DIAGNOSIS — E43 Unspecified severe protein-calorie malnutrition: Secondary | ICD-10-CM | POA: Diagnosis not present

## 2021-08-03 DIAGNOSIS — E872 Acidosis, unspecified: Secondary | ICD-10-CM | POA: Diagnosis not present

## 2021-08-03 DIAGNOSIS — E039 Hypothyroidism, unspecified: Secondary | ICD-10-CM | POA: Diagnosis not present

## 2021-08-16 ENCOUNTER — Telehealth: Payer: Self-pay | Admitting: Cardiovascular Disease

## 2021-08-16 DIAGNOSIS — I5032 Chronic diastolic (congestive) heart failure: Secondary | ICD-10-CM | POA: Diagnosis not present

## 2021-08-16 DIAGNOSIS — I110 Hypertensive heart disease with heart failure: Secondary | ICD-10-CM | POA: Diagnosis not present

## 2021-08-16 NOTE — Telephone Encounter (Signed)
  Pt c/o Shortness Of Breath: STAT if SOB developed within the last 24 hours or pt is noticeably SOB on the phone  1. Are you currently SOB (can you hear that pt is SOB on the phone)? no  2. How long have you been experiencing SOB? few days   3. Are you SOB when sitting or when up moving around? Moving around  4. Are you currently experiencing any other symptoms?  Vicente Males said pt is more SOB with exertion, however, pt don't need to use oxygen. Pt's son also giving pt 40 mg of lasix every morning

## 2021-08-16 NOTE — Telephone Encounter (Signed)
[name], palliative care nurse, back about her message. She stated patient is having to take lasix 40 mg daily when she has swelling and SOB with activity. She stated patient's son has been giving her a reduced dose lasix 20 mg daily since she has been home from the hospital. Patient has been prescribed Lasix 40 mg BID since discharge. [name] thinks patient needs to be seen sooner than a year. Will make her an appointment with Dr. Johnsie Cancel to follow up in October to see how she is doing at that time. Called son about patient and dosage on lasix. Son stated patient was doing fine with lasix 20 mg daily, that 40 mg was too much and her BP was low too. Son stated he gives her 40 mg in the AM when she is has swelling and SOB with activity. Son is also monitoring patient's BP. Encourage him to call our office if patient is having to take lasix 40 mg more often and symptoms are not improving, and BP is low. Patient's son verbalized understanding.

## 2021-08-17 NOTE — Telephone Encounter (Signed)
Left message for patients son to call back.

## 2021-09-02 DIAGNOSIS — E872 Acidosis, unspecified: Secondary | ICD-10-CM | POA: Diagnosis not present

## 2021-09-02 DIAGNOSIS — Z7189 Other specified counseling: Secondary | ICD-10-CM | POA: Diagnosis not present

## 2021-09-02 DIAGNOSIS — E43 Unspecified severe protein-calorie malnutrition: Secondary | ICD-10-CM | POA: Diagnosis not present

## 2021-09-02 DIAGNOSIS — E039 Hypothyroidism, unspecified: Secondary | ICD-10-CM | POA: Diagnosis not present

## 2021-09-02 DIAGNOSIS — I5033 Acute on chronic diastolic (congestive) heart failure: Secondary | ICD-10-CM | POA: Diagnosis not present

## 2021-09-03 DIAGNOSIS — E43 Unspecified severe protein-calorie malnutrition: Secondary | ICD-10-CM | POA: Diagnosis not present

## 2021-09-03 DIAGNOSIS — E872 Acidosis, unspecified: Secondary | ICD-10-CM | POA: Diagnosis not present

## 2021-09-03 DIAGNOSIS — E039 Hypothyroidism, unspecified: Secondary | ICD-10-CM | POA: Diagnosis not present

## 2021-09-03 DIAGNOSIS — I5033 Acute on chronic diastolic (congestive) heart failure: Secondary | ICD-10-CM | POA: Diagnosis not present

## 2021-09-03 DIAGNOSIS — Z7189 Other specified counseling: Secondary | ICD-10-CM | POA: Diagnosis not present

## 2021-09-09 ENCOUNTER — Telehealth: Payer: Self-pay | Admitting: Cardiovascular Disease

## 2021-09-09 NOTE — Telephone Encounter (Signed)
   Pt c/o swelling: STAT is pt has developed SOB within 24 hours  If swelling, where is the swelling located? 1+ left foot   How much weight have you gained and in what time span?   Have you gained 3 pounds in a day or 5 pounds in a week?   Do you have a log of your daily weights (if so, list)? 98.7 yesterday, today 101.4  Are you currently taking a fluid pill?   Are you currently SOB? Yes, occasionally   Have you traveled recently? No    Per [name], Pt is having weight gain. She was 101.4 today, 98.7 yesterday and 96.6 end July. Pt's son said pt id eating better, he kept track her weight everyday. Also, he kept track of the lasix given to the pt. On 07/31 til today 8/24, pt is given extra 20 mg lasix every day. During that time frame he gave an extra 40 mg everyday for 10 days. But her baseline lasix is 20 mg 2x a day. She said, nurse can call [phone] that's her main office, its a hospice clinic but pt is on palliative care. She said whoever answer the call can take a message and give it to her

## 2021-09-09 NOTE — Telephone Encounter (Signed)
Left message for nurse to call back. Will try to get patient in with APP early next week.

## 2021-09-15 ENCOUNTER — Ambulatory Visit: Payer: Medicare Other | Admitting: Physician Assistant

## 2021-09-15 ENCOUNTER — Encounter: Payer: Self-pay | Admitting: Nurse Practitioner

## 2021-09-15 ENCOUNTER — Ambulatory Visit: Payer: Medicare Other | Attending: Physician Assistant | Admitting: Nurse Practitioner

## 2021-09-15 VITALS — BP 106/60 | HR 59 | Ht 60.0 in | Wt 99.6 lb

## 2021-09-15 DIAGNOSIS — Z79899 Other long term (current) drug therapy: Secondary | ICD-10-CM | POA: Diagnosis not present

## 2021-09-15 DIAGNOSIS — I2510 Atherosclerotic heart disease of native coronary artery without angina pectoris: Secondary | ICD-10-CM | POA: Diagnosis not present

## 2021-09-15 DIAGNOSIS — E785 Hyperlipidemia, unspecified: Secondary | ICD-10-CM

## 2021-09-15 DIAGNOSIS — Z951 Presence of aortocoronary bypass graft: Secondary | ICD-10-CM

## 2021-09-15 DIAGNOSIS — I350 Nonrheumatic aortic (valve) stenosis: Secondary | ICD-10-CM

## 2021-09-15 DIAGNOSIS — I5032 Chronic diastolic (congestive) heart failure: Secondary | ICD-10-CM | POA: Diagnosis not present

## 2021-09-15 NOTE — Progress Notes (Signed)
Cardiology Office Note:    Date:  09/15/2021   ID:  BRANNON DECAIRE, DOB 1931-01-19, MRN [id number]  PCP:  Lajean Manes, MD   [facility] Providers Cardiologist:  Jenkins Rouge, MD     Referring MD: Lajean Manes, MD   Chief Complaint: weight gain  History of Present Illness:    [name] is a very pleasant 88 y.o. female with a hx of ASCAD s/p CABG, hypertension, dyslipidemia, severe AS, and advanced dementia.   CABG 2004 with LIMA to LAD SVG to diagonal SVG to OM and SVG to PDA.   Most recent echo 11/28/2019 EF normal, severe AS with mean gradient 53, peak 69 mmHg DVI 0.13 AVA 0.51. Reported by Dr. Johnsie Cancel that she has advanced dementia and is not a TAVR candidate.  She lives with her son who reports she is a DNR.  She was last seen in our office on 08/14/2020 by Dr. Johnsie Cancel.  He prescribed Lasix 20 mg daily as needed.  She was advised to follow-up in 1 year.  We received a request for clearance for 9 teeth to be extracted and for patient to hold aspirin.  She was scheduled for today's appointment.  Her last office visit was with me on 07/12/21. She was accompanied by her son. Reported feeling well with no specific concerns. Asked why patient's physicians no longer visit them in the hospital.  Son was told by a provider during last hospitalization that he should get her in hospice to get on high-dose Lasix so she could pass away soon. It does not look like cardiology was consulted during her April 2023 admission. Some of her medications were changed. Since hospitalization she was taking Lasix 20 mg twice daily, with extra 20 mg with weight gain or leg swelling.  Son reported extra Lasix a few times a months.  Left leg swells more than the right leg. She denied chest pain, shortness of breath, lower extremity edema, fatigue, palpitations, melena, hematuria, hemoptysis, diaphoresis, weakness, presyncope, syncope, orthopnea, and PND. Feels cold all the time.   We received a call  from [place] stating patient's weight has increased almost 3 lbs. She was given extra Lasix on multiple days in the days preceding the call. Appointment was scheduled.   Today, she is here with her son, is in a wheelchair for this visit. She reports she is doing well and does not have specific concerns. Weight has been fluctuating at home per son. Weight was 103.3 lb on 8/29 and 104.1 pm 8/28. States "she is eating like a pig." He has been giving her Lasix 40 mg twice daily. Has mild LE edema, left > right. She is ambulatory at home with a cane at times, at other times without assistance. Makes her own food, washes dishes, performs ADLs. She reports shortness of breath on occasion. She denies chest pain, orthopnea, PND, presyncope, syncope, palpitations.   Past Medical History:  Diagnosis Date   Anemia    Aortic stenosis    mild AS by 02/18/11  [name]   Arthritis    Bradycardia    Asymtomatic resolved   Cancer ([place])    ceervical   Chronic renal disease, stage III ([place])    Coronary artery disease    CABG 2003   Dyslipidemia    goal LSL less then 70 but statin intolerant   GERD (gastroesophageal reflux disease)    Hypertension    Hypothyroidism    Iron deficiency anemia 07/18/2010   Mild AI (  aortic insufficiency)    Mild aortic stenosis    OP (osteoporosis) 10/18/2010   fosamax   Statin intolerance     Past Surgical History:  Procedure Laterality Date   ABDOMINAL HYSTERECTOMY     CARDIAC CATHETERIZATION     2004   cataracts     COLONOSCOPY  09/2010   normal EGD abd COLONOSCOPY   CORONARY ARTERY BYPASS GRAFT  2004   with LIMA to LAD, SVG to diagonal, SVG to OM, SOVG to PDA normal LVF   HIP ARTHROPLASTY Left 03/23/2020   Procedure: ARTHROPLASTY BIPOLAR HIP (HEMIARTHROPLASTY) anterior approach;  Surgeon: Rod Can, MD;  Location: [facility];  Service: Orthopedics;  Laterality: Left;   LUMBAR LAMINECTOMY  09/23/2011   Procedure: MICRODISCECTOMY LUMBAR LAMINECTOMY;  Surgeon: Marybelle Killings, MD;  Location: [facility];  Service: Orthopedics;  Laterality: N/A;  L4-5 Decompression    Current Medications: Current Meds  Medication Sig   acetaminophen (TYLENOL) 500 MG tablet Take 1,000 mg by mouth every 6 (six) hours as needed for mild pain.   aspirin 81 MG chewable tablet Chew 81 mg by mouth daily.   carvedilol (COREG) 3.125 MG tablet Take 1 tablet (3.125 mg total) by mouth 2 (two) times daily.   Cholecalciferol (VITAMIN D-3) 25 MCG (1000 UT) CAPS Take 1,000 Units by mouth daily.   ferrous sulfate 325 (65 FE) MG tablet Take 325 mg by mouth at bedtime.   furosemide (LASIX) 40 MG tablet Take 1 tablet (40 mg total) by mouth 2 (two) times daily.   levothyroxine (SYNTHROID, LEVOTHROID) 75 MCG tablet Take 75 mcg by mouth at bedtime.   Multiple Vitamin (MULTIVITAMIN WITH MINERALS) TABS tablet Take 1 tablet by mouth daily.   vitamin B-12 (CYANOCOBALAMIN) 250 MCG tablet Take 250 mcg by mouth daily.     Allergies:   Aleve [naproxen sodium], Aleve [naproxen], Crestor [rosuvastatin], Excedrin extra strength [aspirin-acetaminophen-caffeine], Meloxicam, Statins, and Vytorin [ezetimibe-simvastatin]   Social History   Socioeconomic History   Marital status: Widowed    Spouse name: Not on file   Number of children: Not on file   Years of education: Not on file   Highest education level: Not on file  Occupational History   Not on file  Tobacco Use   Smoking status: Never   Smokeless tobacco: Never  Vaping Use   Vaping Use: Never used  Substance and Sexual Activity   Alcohol use: No   Drug use: No   Sexual activity: Not on file  Other Topics Concern   Not on file  Social History Narrative   Not on file   Social Determinants of Health   Financial Resource Strain: Not on file  Food Insecurity: Not on file  Transportation Needs: Not on file  Physical Activity: Not on file  Stress: Not on file  Social Connections: Not on file     Family History: The patient's family history  includes Asthma in her father; Heart Problems in her mother.  ROS:   Please see the history of present illness.  All other systems reviewed and are negative.  Labs/Other Studies Reviewed:    The following studies were reviewed today:  Echo 11/28/2019  1. The aortic valve is abnormal. There is severe calcifcation of the  aortic valve. Aortic valve regurgitation is mild. Severe aortic valve  stenosis. Aortic valve area, by VTI measures 0.40 cm. Aortic valve mean  gradient measures 53.0 mmHg. Aortic valve   Vmax measures 4.16 m/s.   2. Left ventricular  ejection fraction, by estimation, is 55 to 60%. The  left ventricle has normal function. The left ventricle has no regional  wall motion abnormalities. Left ventricular diastolic parameters are  consistent with Grade II diastolic  dysfunction (pseudonormalization). Elevated left ventricular end-diastolic  pressure. The average left ventricular global longitudinal strain is -20.1  %. The global longitudinal strain is normal.   3. Right ventricular systolic function is normal. The right ventricular  size is normal. There is mildly elevated pulmonary artery systolic  pressure. The estimated right ventricular systolic pressure is 93.2 mmHg.   4. Left atrial size was mildly dilated.   5. Right atrial size was mildly dilated.   6. The mitral valve is grossly normal. Mild to moderate mitral valve  regurgitation. No evidence of mitral stenosis.   7. The inferior vena cava is dilated in size with >50% respiratory  variability, suggesting right atrial pressure of 8 mmHg.   Comparison(s): A prior study was performed on 04/30/19. Prior images  reviewed side by side. Increase in systolic mean gradient through aortic  valve, no other significant changes.   Recent Labs: 05/01/2021: B Natriuretic Peptide >4,500.0; Hemoglobin 12.0; Platelets 186 05/03/2021: ALT 12; BUN 53; Creatinine, Ser 2.15; Potassium 3.5; Sodium 138  Recent Lipid Panel     Component Value Date/Time   CHOL 208 (H) 03/24/2014 1008   TRIG 129.0 03/24/2014 1008   HDL 44.80 03/24/2014 1008   CHOLHDL 5 03/24/2014 1008   VLDL 25.8 03/24/2014 1008   LDLCALC 137 (H) 03/24/2014 1008     Risk Assessment/Calculations:      Physical Exam:    VS:  BP 106/60   Pulse (!) 59   Ht 5' (1.524 m)   Wt 99 lb 9.6 oz (45.2 kg)   SpO2 97%   BMI 19.45 kg/m     Wt Readings from Last 3 Encounters:  09/15/21 99 lb 9.6 oz (45.2 kg)  07/12/21 95 lb 12.8 oz (43.5 kg)  05/03/21 88 lb 6.4 oz (40.1 kg)     GEN:  Frail elderly female in no acute distress HEENT: Normal NECK: No JVD; No carotid bruits CARDIAC: RRR. 3/6 systolic murmur RUSB. No rubs, gallops RESPIRATORY:  Clear to auscultation without rales, wheezing or rhonchi  ABDOMEN: Soft, non-tender, non-distended MUSCULOSKELETAL:  Mild bilateral LE edema, L > R; No deformity. 2+ pedal pulses, equal bilaterally SKIN: Warm and dry NEUROLOGIC:  Alert and oriented x 3 PSYCHIATRIC:  Normal affect   EKG:  EKG is not ordered today  Diagnoses:    1. Medication management   2. Severe aortic stenosis   3. Hx of CABG   4. Coronary artery disease involving native coronary artery of native heart without angina pectoris   5. Hyperlipidemia LDL goal <70   6. Chronic heart failure with preserved ejection fraction (HFpEF) (HCC)     Assessment and Plan:      Weight gain/Leg swelling/Chronic HFpEF: Normal LVEF 55-60%, G2DD on echo 11/2019. Weight is fluctuating but she reports increased appetite. No evidence of volume overload on exam. Son has been maintaining diuretic therapy of Lasix 40 mg twice daily.  We will get bmet today to determine if diuretic dosage needs to be adjusted.  Advised to continue daily weights. Avoid high sodium foods. Notify us if symptoms of dyspnea or other concerns worsen prior to appointment with Dr. Johnsie Cancel.   Severe aortic stenosis: Severe AS with normal EF by most recent echo 11/2019.  Not a  candidate for TAVR per Dr.  Nishan. Has mild DOE. No chest pain, presyncope, syncope, orthopnea or PND. Continue Lasix. Follow-up with Dr. Johnsie Cancel in 2 months.   CAD s/p CABG without angina: History of CABG 2004 with no repeat coronary intervention. She denies chest pain, dyspnea, or other symptoms concerning for angina.  Has mild DOE that she feels is stable. No indication for further ischemic evaluation at this time. No bleeding concerns. Continue aspirin, carvedilol.  Hyperlipidemia LDL goal < 70: LDL has not been checked in several years.  She has stopped her statin which I agree is reasonable with other co-morbidities.   Disposition: Keep October appointment with Dr. Johnsie Cancel    Medication Adjustments/Labs and Tests Ordered: Current medicines are reviewed at length with the patient today.  Concerns regarding medicines are outlined above.  Orders Placed This Encounter  Procedures   Basic Metabolic Panel (BMET)   No orders of the defined types were placed in this encounter.   Patient Instructions  Medication Instructions:   Your physician recommends that you continue on your current medications as directed. Please refer to the Current Medication list given to you today.   *If you need a refill on your cardiac medications before your next appointment, please call your pharmacy*   Lab Work:  TODAY!!!! BMET  If you have labs (blood work) drawn today and your tests are completely normal, you will receive your results only by: [place] (if you have MyChart) OR A paper copy in the mail If you have any lab test that is abnormal or we need to change your treatment, we will call you to review the results.   Testing/Procedures:  None ordered.   Follow-Up: At [facility], you and your health needs are our priority.  As part of our continuing mission to provide you with exceptional heart care, we have created designated Provider Care Teams.  These Care Teams include  your primary Cardiologist (physician) and Advanced Practice Providers (APPs -  Physician Assistants and Nurse Practitioners) who all work together to provide you with the care you need, when you need it.  We recommend signing up for the patient portal called "MyChart".  Sign up information is provided on this After Visit Summary.  MyChart is used to connect with patients for Virtual Visits (Telemedicine).  Patients are able to view lab/test results, encounter notes, upcoming appointments, etc.  Non-urgent messages can be sent to your provider as well.   To learn more about what you can do with MyChart, go to [email or web address].    Your next appointment:   2 month(s)  The format for your next appointment:   In Person  Provider:   Jenkins Rouge, MD     Important Information About Sugar         Signed, Emmaline Life, NP  09/15/2021 11:03 AM    [place]

## 2021-09-15 NOTE — Patient Instructions (Signed)
Medication Instructions:   Your physician recommends that you continue on your current medications as directed. Please refer to the Current Medication list given to you today.   *If you need a refill on your cardiac medications before your next appointment, please call your pharmacy*   Lab Work:  TODAY!!!! BMET  If you have labs (blood work) drawn today and your tests are completely normal, you will receive your results only by: [place] (if you have MyChart) OR A paper copy in the mail If you have any lab test that is abnormal or we need to change your treatment, we will call you to review the results.   Testing/Procedures:  None ordered.   Follow-Up: At [facility], you and your health needs are our priority.  As part of our continuing mission to provide you with exceptional heart care, we have created designated Provider Care Teams.  These Care Teams include your primary Cardiologist (physician) and Advanced Practice Providers (APPs -  Physician Assistants and Nurse Practitioners) who all work together to provide you with the care you need, when you need it.  We recommend signing up for the patient portal called "MyChart".  Sign up information is provided on this After Visit Summary.  MyChart is used to connect with patients for Virtual Visits (Telemedicine).  Patients are able to view lab/test results, encounter notes, upcoming appointments, etc.  Non-urgent messages can be sent to your provider as well.   To learn more about what you can do with MyChart, go to [email or web address].    Your next appointment:   2 month(s)  The format for your next appointment:   In Person  Provider:   Jenkins Rouge, MD     Important Information About Sugar

## 2021-09-16 DIAGNOSIS — I110 Hypertensive heart disease with heart failure: Secondary | ICD-10-CM | POA: Diagnosis not present

## 2021-09-16 DIAGNOSIS — I5032 Chronic diastolic (congestive) heart failure: Secondary | ICD-10-CM | POA: Diagnosis not present

## 2021-09-18 LAB — BASIC METABOLIC PANEL
BUN/Creatinine Ratio: 23 (ref 12–28)
BUN: 40 mg/dL — ABNORMAL HIGH (ref 8–27)
CO2: 22 mmol/L (ref 20–29)
Calcium: 10.1 mg/dL (ref 8.7–10.3)
Chloride: 102 mmol/L (ref 96–106)
Creatinine, Ser: 1.72 mg/dL — ABNORMAL HIGH (ref 0.57–1.00)
Glucose: 98 mg/dL (ref 70–99)
Potassium: 4.1 mmol/L (ref 3.5–5.2)
Sodium: 145 mmol/L — ABNORMAL HIGH (ref 134–144)
eGFR: 28 mL/min/{1.73_m2} — ABNORMAL LOW (ref >59)

## 2021-10-03 DIAGNOSIS — Z7189 Other specified counseling: Secondary | ICD-10-CM | POA: Diagnosis not present

## 2021-10-03 DIAGNOSIS — E872 Acidosis, unspecified: Secondary | ICD-10-CM | POA: Diagnosis not present

## 2021-10-03 DIAGNOSIS — E039 Hypothyroidism, unspecified: Secondary | ICD-10-CM | POA: Diagnosis not present

## 2021-10-03 DIAGNOSIS — I5033 Acute on chronic diastolic (congestive) heart failure: Secondary | ICD-10-CM | POA: Diagnosis not present

## 2021-10-03 DIAGNOSIS — E43 Unspecified severe protein-calorie malnutrition: Secondary | ICD-10-CM | POA: Diagnosis not present

## 2021-10-04 DIAGNOSIS — I5033 Acute on chronic diastolic (congestive) heart failure: Secondary | ICD-10-CM | POA: Diagnosis not present

## 2021-10-04 DIAGNOSIS — E43 Unspecified severe protein-calorie malnutrition: Secondary | ICD-10-CM | POA: Diagnosis not present

## 2021-10-04 DIAGNOSIS — E872 Acidosis, unspecified: Secondary | ICD-10-CM | POA: Diagnosis not present

## 2021-10-04 DIAGNOSIS — Z7189 Other specified counseling: Secondary | ICD-10-CM | POA: Diagnosis not present

## 2021-10-04 DIAGNOSIS — E039 Hypothyroidism, unspecified: Secondary | ICD-10-CM | POA: Diagnosis not present

## 2021-10-16 DIAGNOSIS — I4819 Other persistent atrial fibrillation: Secondary | ICD-10-CM | POA: Diagnosis not present

## 2021-10-19 IMAGING — CR DG HIP (WITH OR WITHOUT PELVIS) 2-3V*L*
3 series · 3 of 3 positions shown · non-contrast
Comparison: None.

CLINICAL DATA: Left hip pain after fall

EXAM:
DG HIP (WITH OR WITHOUT PELVIS) 2-3V LEFT

[x pelvis]
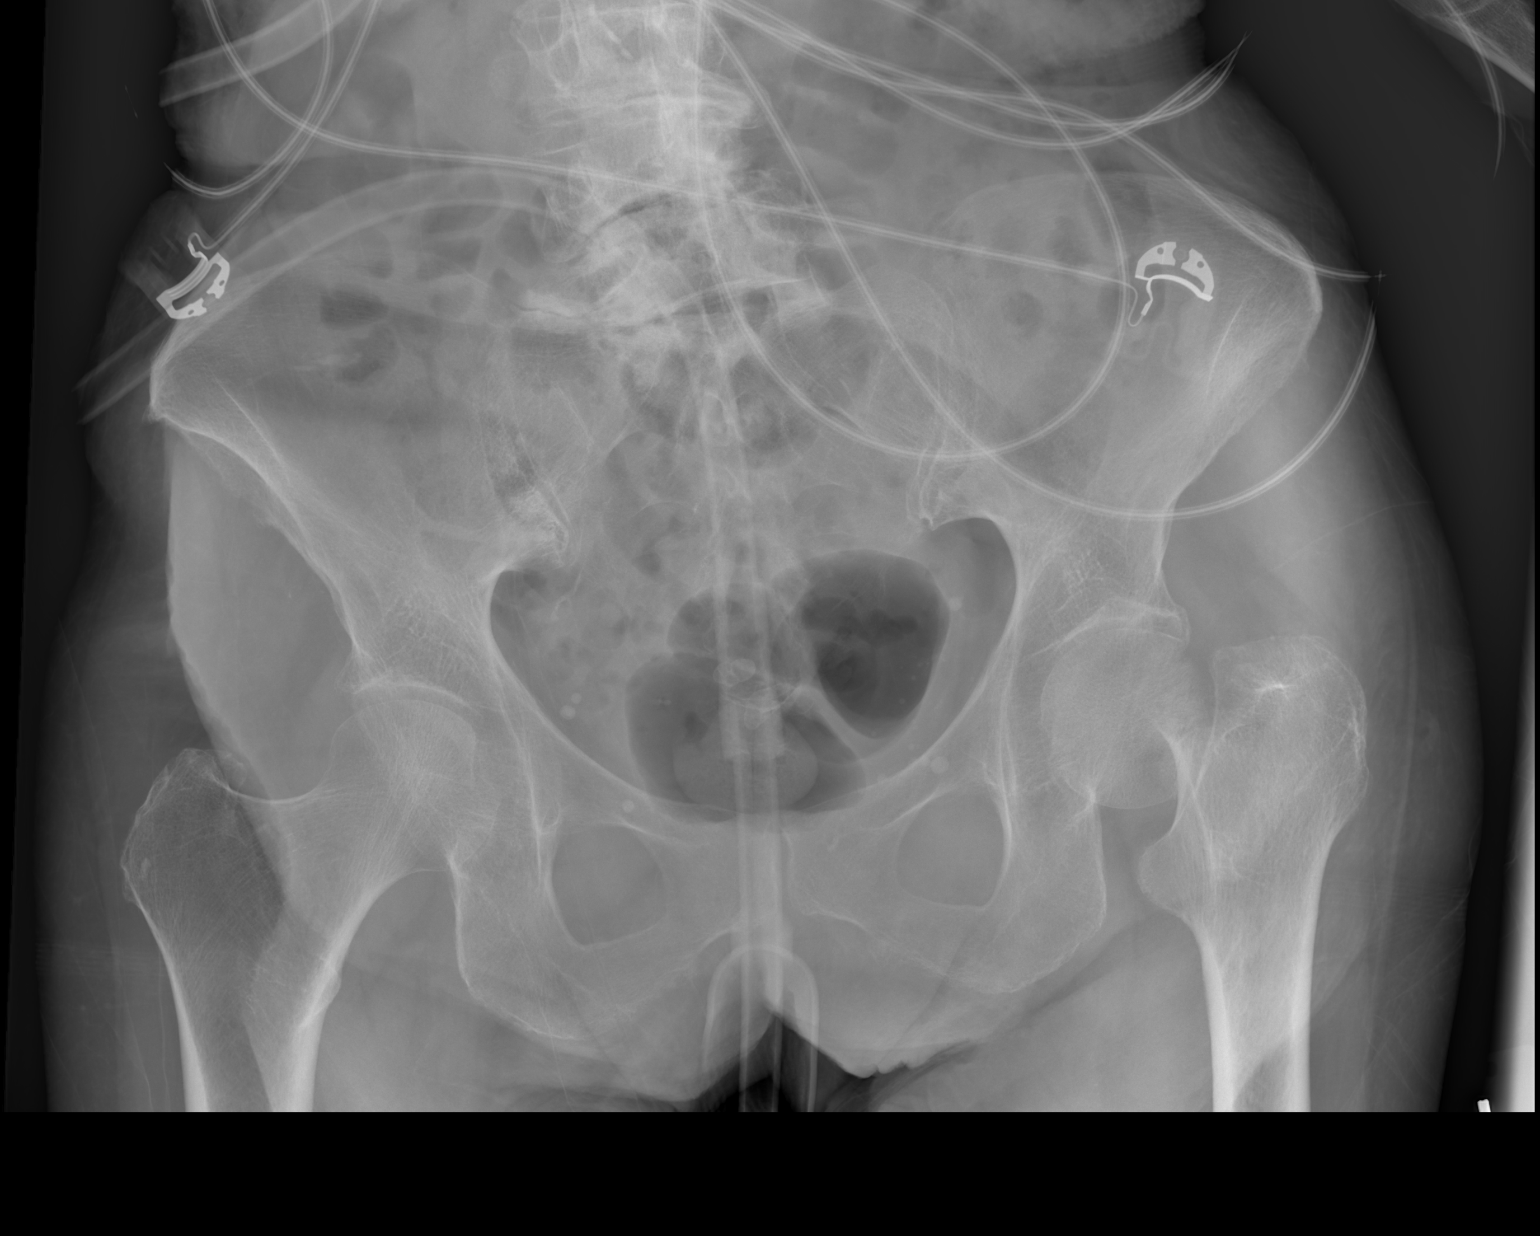

[x hip lat left]
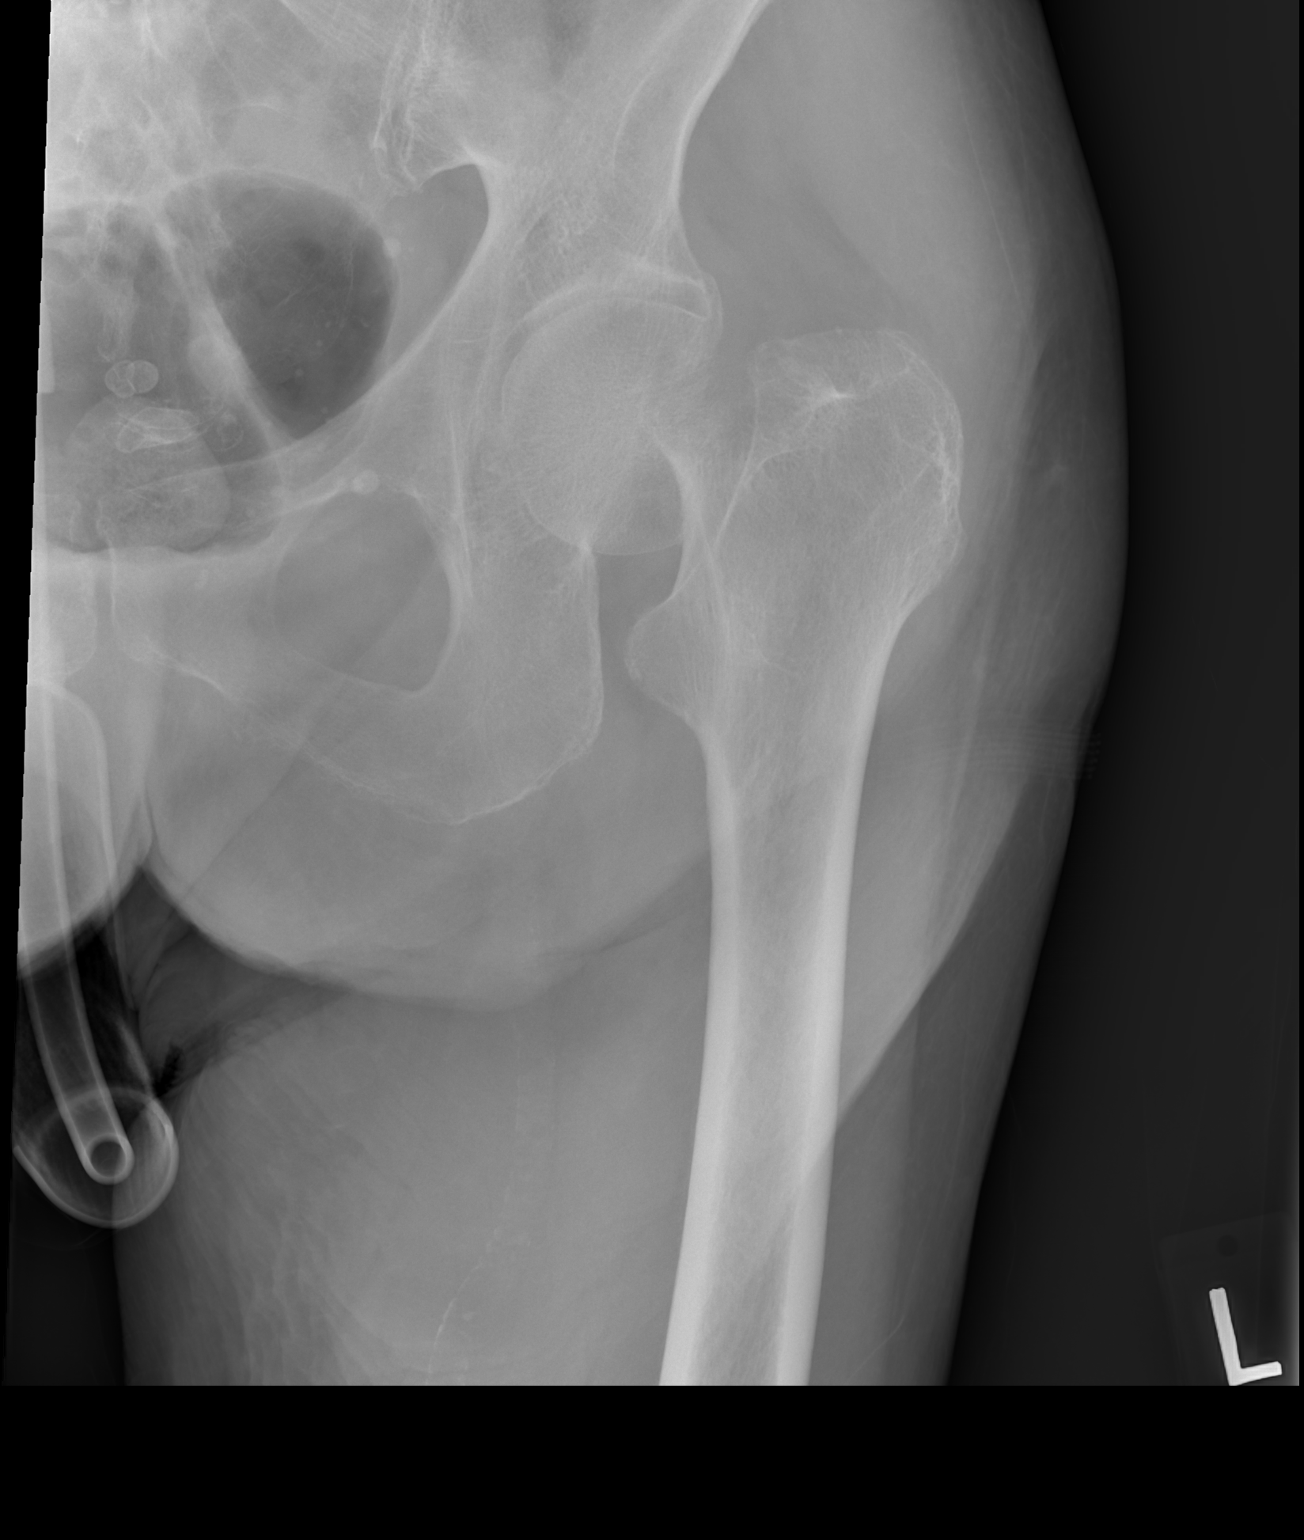

[w hip lat left]
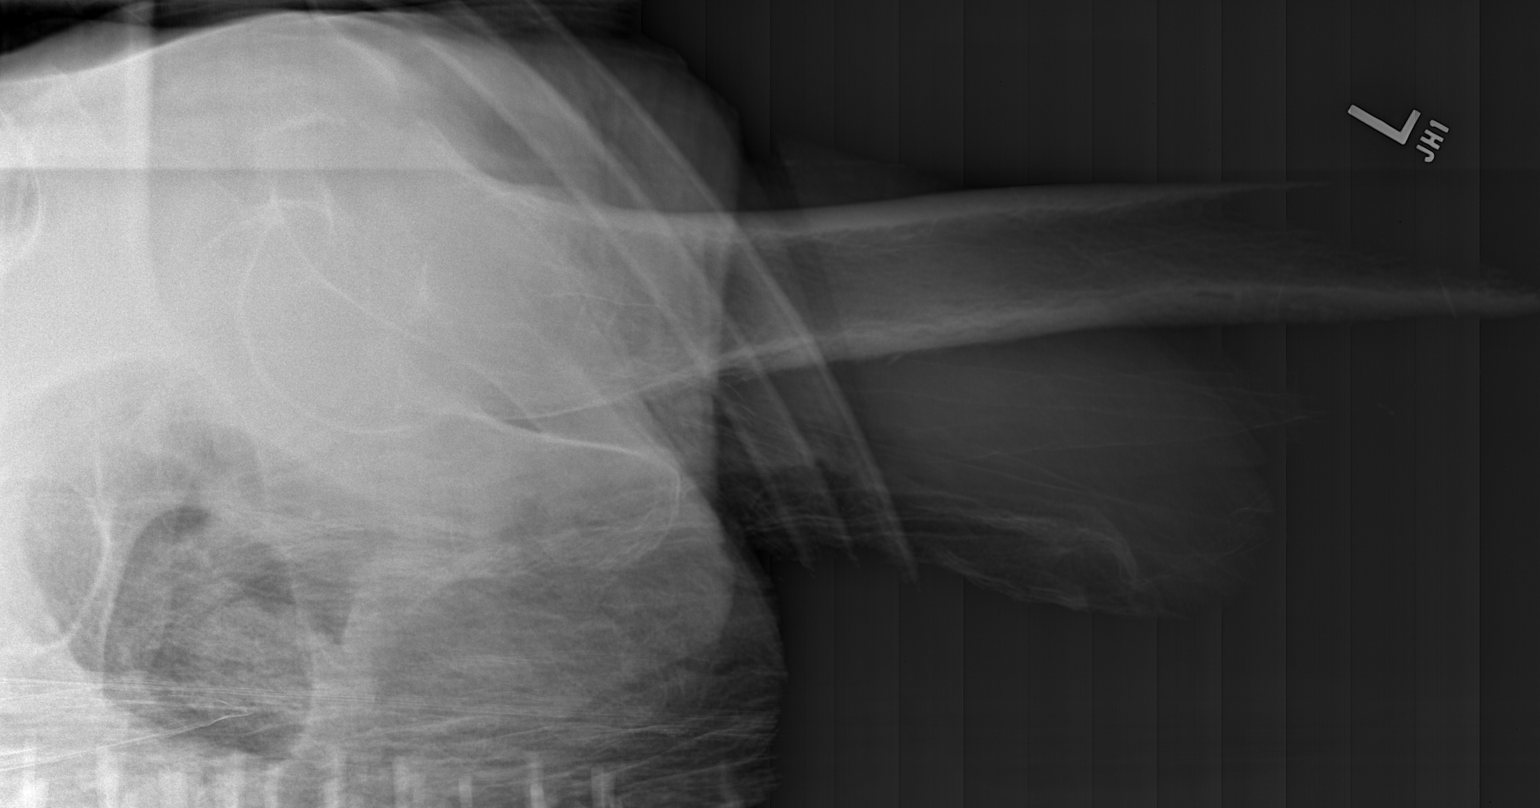

[3 of 3 positions shown; findings below may reference images not displayed]

FINDINGS: Displaced left femoral neck fracture with varus angulation.
Generalized osteopenia. No visible pelvic ring fracture. Advanced
lumbar spine degeneration with dextroscoliosis where covered.
IMPRESSION: Displaced left femoral neck fracture.

## 2021-10-19 IMAGING — CR DG CHEST 1V
1 series · 1 of 1 positions shown · non-contrast
Comparison: 09/13/2011

CLINICAL DATA: Left hip pain after fall

EXAM:
CHEST  1 VIEW

[x chest ap]
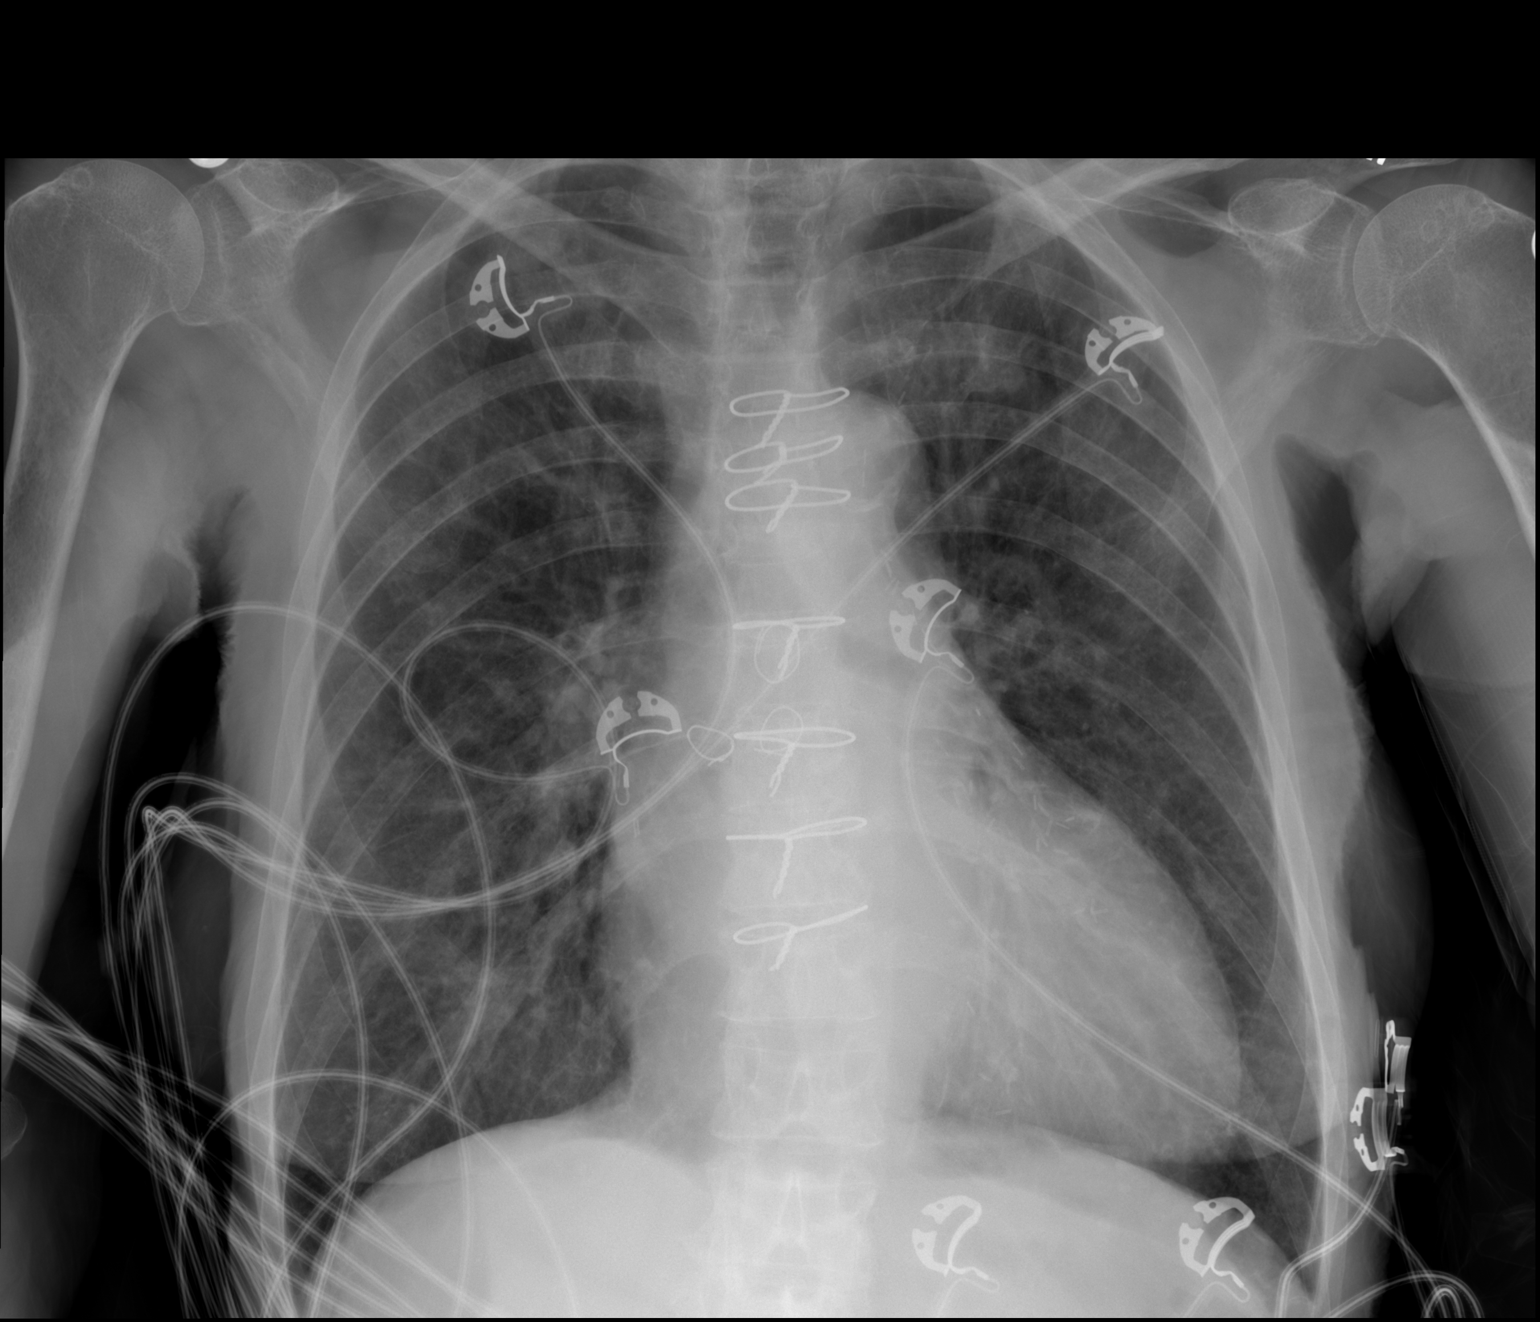

[1 of 1 positions shown; findings below may reference images not displayed]

FINDINGS: Cardiomegaly.  Prior CABG.

Prominent interstitial markings without Kerley lines, effusion, or
air bronchogram.

No visible fracture.
IMPRESSION: Cardiomegaly and borderline vascular congestion.

## 2021-10-20 IMAGING — RF DG HIP (WITH PELVIS) OPERATIVE*L*
1 series · 4 of 4 positions shown · non-contrast
Comparison: None.

CLINICAL DATA: Hip replacement

EXAM:
OPERATIVE LEFT HIP (WITH PELVIS IF PERFORMED) 1 VIEW
TECHNIQUE: Fluoroscopic spot image(s) were submitted for interpretation
post-operatively.

[Series 1: unknown protocol · 0.20mm/px · 4 of 4 slices shown]
[im 1/4]
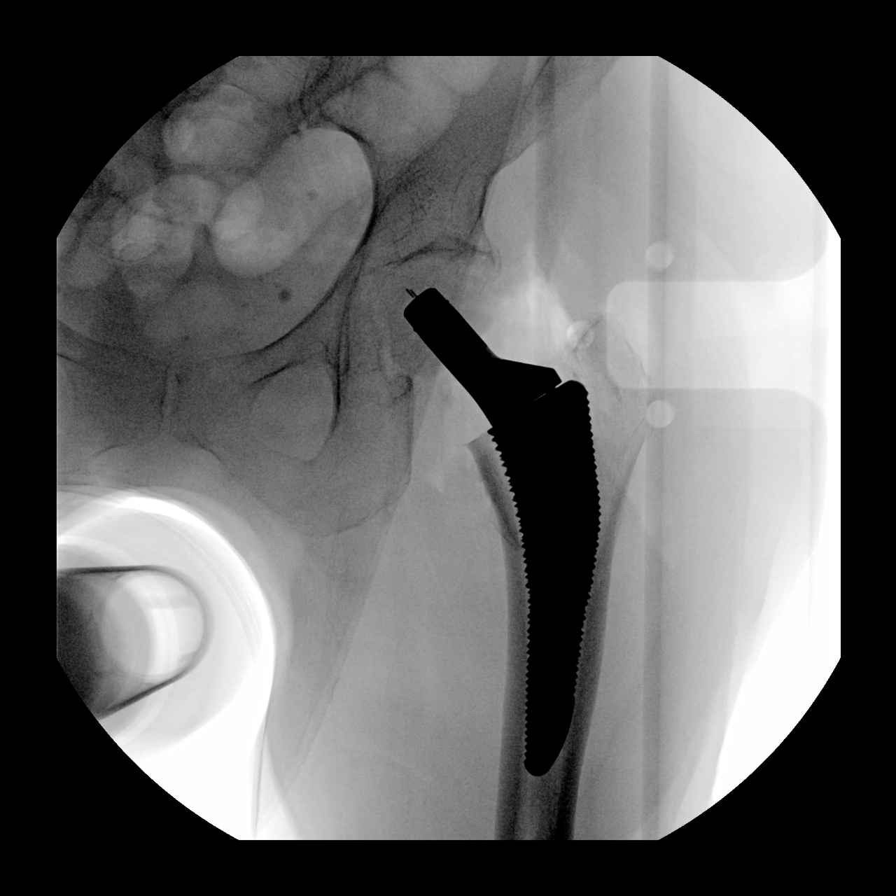
[im 2/4]
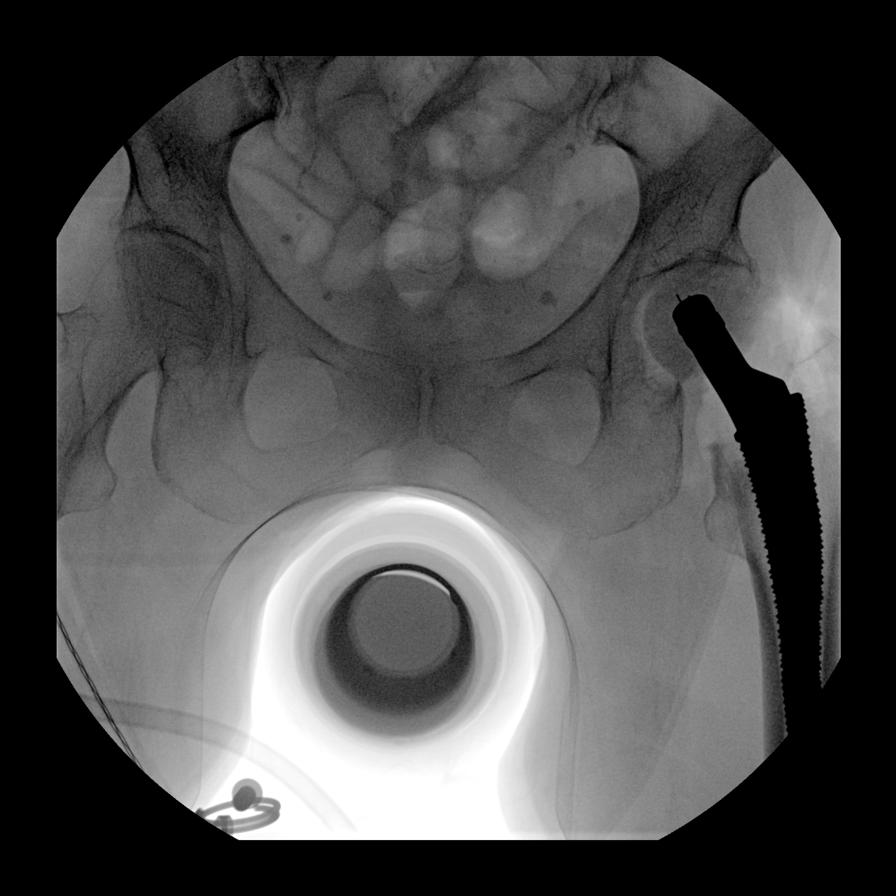
[im 3/4]
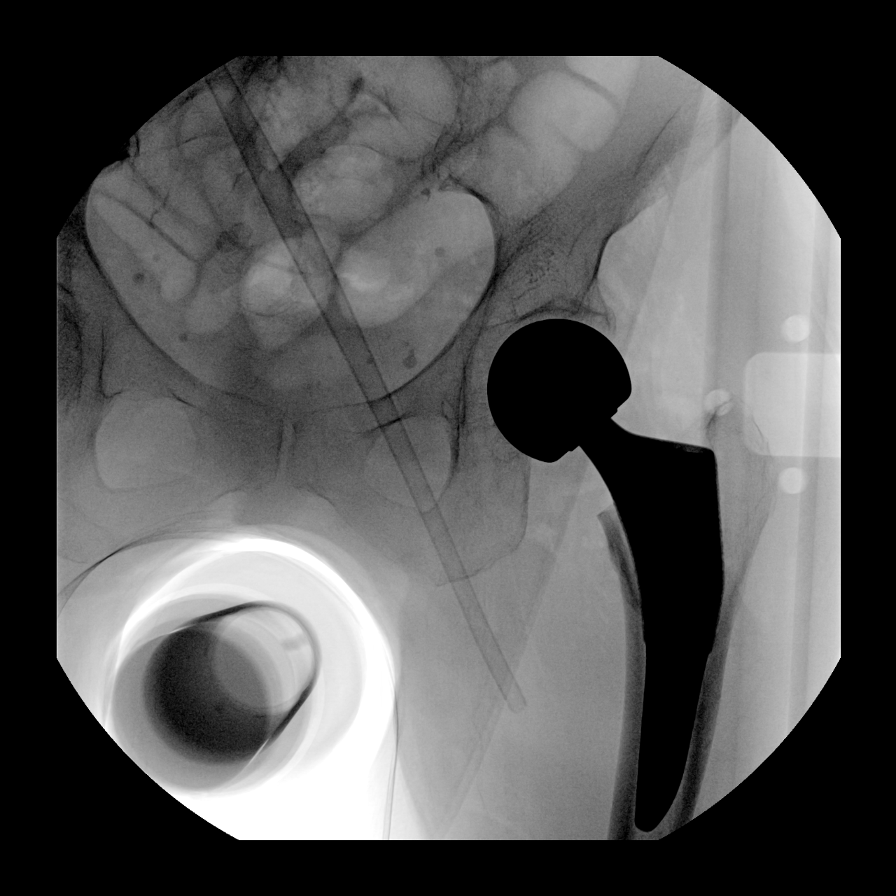
[im 4/4]
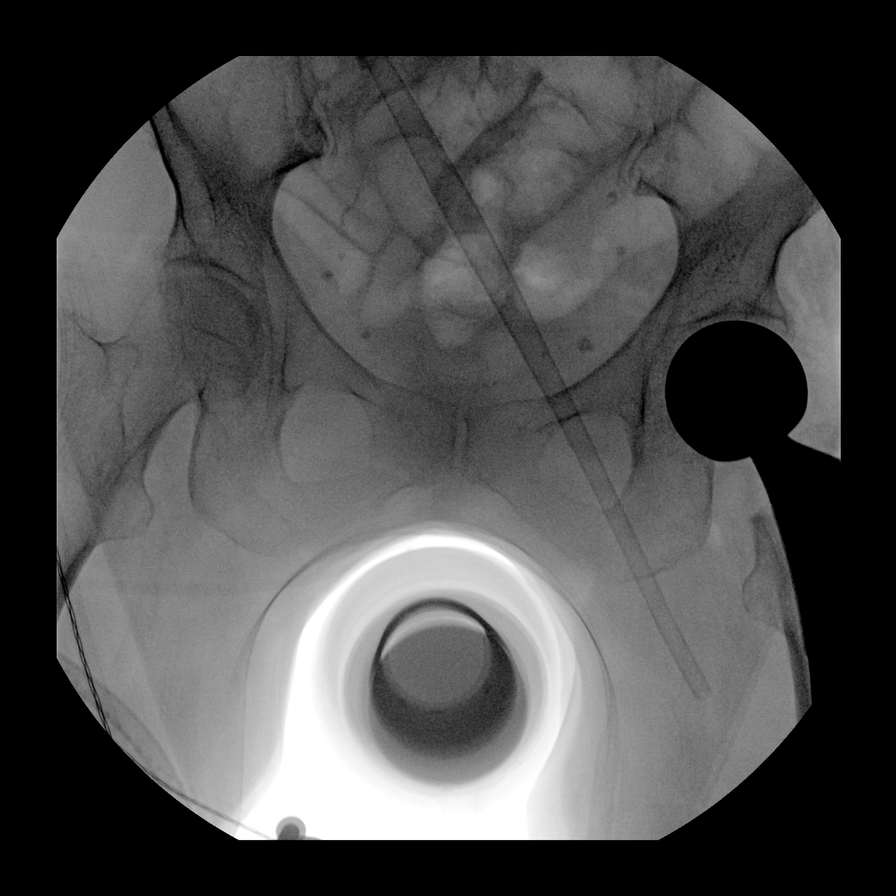

[4 of 4 positions shown; findings below may reference images not displayed]

FINDINGS: Multiple frontal images of the left hip are identified demonstrating
total left hip arthroplasty. No evidence of complication on these
images.
IMPRESSION: Fluoroscopic guidance for left total hip arthroplasty.

## 2021-11-02 DIAGNOSIS — E039 Hypothyroidism, unspecified: Secondary | ICD-10-CM | POA: Diagnosis not present

## 2021-11-02 DIAGNOSIS — I5033 Acute on chronic diastolic (congestive) heart failure: Secondary | ICD-10-CM | POA: Diagnosis not present

## 2021-11-02 DIAGNOSIS — E872 Acidosis, unspecified: Secondary | ICD-10-CM | POA: Diagnosis not present

## 2021-11-02 DIAGNOSIS — E43 Unspecified severe protein-calorie malnutrition: Secondary | ICD-10-CM | POA: Diagnosis not present

## 2021-11-02 DIAGNOSIS — Z7189 Other specified counseling: Secondary | ICD-10-CM | POA: Diagnosis not present

## 2021-11-03 DIAGNOSIS — E039 Hypothyroidism, unspecified: Secondary | ICD-10-CM | POA: Diagnosis not present

## 2021-11-03 DIAGNOSIS — E872 Acidosis, unspecified: Secondary | ICD-10-CM | POA: Diagnosis not present

## 2021-11-03 DIAGNOSIS — Z7189 Other specified counseling: Secondary | ICD-10-CM | POA: Diagnosis not present

## 2021-11-03 DIAGNOSIS — I5033 Acute on chronic diastolic (congestive) heart failure: Secondary | ICD-10-CM | POA: Diagnosis not present

## 2021-11-03 DIAGNOSIS — E43 Unspecified severe protein-calorie malnutrition: Secondary | ICD-10-CM | POA: Diagnosis not present

## 2021-11-04 NOTE — Progress Notes (Signed)
Evaluation Performed:  Follow-up visit  Date:  11/09/2021   ID:  [name], DOB October 26, 1931, MRN [id number]  Provider Location: Office  PCP:  Lajean Manes, MD  Cardiologist:  Jenkins Rouge, MD   Electrophysiologist:  None   Chief Complaint:  CAD/AS  History of Present Illness:    [name] is an 88 y.o. female with a history of  ASCAD s/p CABG, HTN, normal LVF, dyslipidemia and mild AS     CABG 2004 with LIMA to LAD SVG to Diagonal SVG OM and SVG PDA   No palpitations syncope or chest pain   I took care of her 2nd husband [name] Sister [name] is a patient of mine as well   Last echo 11/28/19 EF normal mean gradient 52 peak 69 mmHg DVI o.13 AVA 0.51   She has advanced dementia and is not a TAVR candidate She has 3 boys one [name] lives with her and one lives close with her sister [name]   We get called for her LE edema left >right and she takes lasix 40 mg bid at this time Able to walk with cane and do some ADL's Weight usually around 104 lbs   Admitted with CHF 06/27/20 BNP 4500 R/O Rx with iv lasix  And has been ok on oral dosing   She is a DNR confirmed with son with her today    Past Medical History:  Diagnosis Date   Anemia    Aortic stenosis    mild AS by 02/18/11  [name]   Arthritis    Bradycardia    Asymtomatic resolved   Cancer ([place])    ceervical   Chronic renal disease, stage III ([place])    Coronary artery disease    CABG 2003   Dyslipidemia    goal LSL less then 70 but statin intolerant   GERD (gastroesophageal reflux disease)    Hypertension    Hypothyroidism    Iron deficiency anemia 07/18/2010   Mild AI (aortic insufficiency)    Mild aortic stenosis    OP (osteoporosis) 10/18/2010   fosamax   Statin intolerance    Past Surgical History:  Procedure Laterality Date   ABDOMINAL HYSTERECTOMY     CARDIAC CATHETERIZATION     2004   cataracts     COLONOSCOPY  09/2010   normal EGD abd COLONOSCOPY   CORONARY ARTERY BYPASS  GRAFT  2004   with LIMA to LAD, SVG to diagonal, SVG to OM, SOVG to PDA normal LVF   HIP ARTHROPLASTY Left 03/23/2020   Procedure: ARTHROPLASTY BIPOLAR HIP (HEMIARTHROPLASTY) anterior approach;  Surgeon: Rod Can, MD;  Location: [facility];  Service: Orthopedics;  Laterality: Left;   LUMBAR LAMINECTOMY  09/23/2011   Procedure: MICRODISCECTOMY LUMBAR LAMINECTOMY;  Surgeon: Marybelle Killings, MD;  Location: [facility];  Service: Orthopedics;  Laterality: N/A;  L4-5 Decompression     Current Meds  Medication Sig   acetaminophen (TYLENOL) 500 MG tablet Take 1,000 mg by mouth every 6 (six) hours as needed for mild pain.   aspirin 81 MG chewable tablet Chew 81 mg by mouth daily.   carvedilol (COREG) 3.125 MG tablet Take 1 tablet (3.125 mg total) by mouth 2 (two) times daily.   Cholecalciferol (VITAMIN D-3) 25 MCG (1000 UT) CAPS Take 1,000 Units by mouth daily.   ferrous sulfate 325 (65 FE) MG tablet Take 325 mg by mouth at bedtime.   furosemide (LASIX) 40 MG tablet Take 1 tablet (40 mg  total) by mouth 2 (two) times daily.   levothyroxine (SYNTHROID, LEVOTHROID) 75 MCG tablet Take 75 mcg by mouth at bedtime.   Multiple Vitamin (MULTIVITAMIN WITH MINERALS) TABS tablet Take 1 tablet by mouth daily.   vitamin B-12 (CYANOCOBALAMIN) 250 MCG tablet Take 250 mcg by mouth daily.     Allergies:   Aleve [naproxen sodium], Aleve [naproxen], Crestor [rosuvastatin], Excedrin extra strength [aspirin-acetaminophen-caffeine], Meloxicam, Statins, and Vytorin [ezetimibe-simvastatin]   Social History   Tobacco Use   Smoking status: Never   Smokeless tobacco: Never  Vaping Use   Vaping Use: Never used  Substance Use Topics   Alcohol use: No   Drug use: No     Family Hx: The patient's family history includes Asthma in her father; Heart Problems in her mother.  ROS:   Please see the history of present illness.     All other systems reviewed and are negative.   Prior CV studies:   The following studies were  reviewed today: Echo 01/30/17  Echo 10/04/18  Echo 04/30/19   Labs/Other Tests and Data Reviewed:    EKG:   04/07/16 SR rate 58 normal SR read as flutter but artifact rate 70 normal   Recent Labs: 05/01/2021: B Natriuretic Peptide >4,500.0; Hemoglobin 12.0; Platelets 186 05/03/2021: ALT 12 09/15/2021: BUN 40; Creatinine, Ser 1.72; Potassium 4.1; Sodium 145   Recent Lipid Panel Lab Results  Component Value Date/Time   CHOL 208 (H) 03/24/2014 10:08 AM   TRIG 129.0 03/24/2014 10:08 AM   HDL 44.80 03/24/2014 10:08 AM   CHOLHDL 5 03/24/2014 10:08 AM   LDLCALC 137 (H) 03/24/2014 10:08 AM    Wt Readings from Last 3 Encounters:  11/09/21 95 lb (43.1 kg)  09/15/21 99 lb 9.6 oz (45.2 kg)  07/12/21 95 lb 12.8 oz (43.5 kg)     Objective:    Vital Signs:  BP (!) 106/58   Pulse 62   Ht 5' (1.524 m)   Wt 95 lb (43.1 kg)   SpO2 99%   BMI 18.55 kg/m   Affect appropriate Healthy:  appears stated age HEENT: normal Neck supple with no adenopathy JVP normal left  bruits no thyromegaly Lungs clear with no wheezing and good diaphragmatic motion Heart:  S1/S2 AS  murmur, no rub, gallop or click PMI normal Abdomen: benighn, BS positve, no tenderness, no AAA no bruit.  No HSM or HJR Distal pulses intact with no bruits No edema Neuro non-focal Skin warm and dry Right TKR    ASSESSMENT & PLAN:    AS:  Severe mean gradient  53 mmHg by TTE 11/28/19  given age and advanced dementia not a candidate for TAVR will prescribe Lasix 20 mg PO PRN for dyspnea, weight gain or edema  CAD/CABG:  2004 no chest pain observe given age and lack of symptoms HLD:  On zetia labs with primary Thyroid:  On replacement labs with primary  Edema:  more LE venous disease ? Diastolic dysfunction from AS continue Lasix   COVID-19 Education: The signs and symptoms of COVID-19 were discussed with the patient and how to seek care for testing (follow up with PCP or arrange E-visit).  The importance of social distancing  was discussed today.  Time:   Today, I have spent 30 minutes with the patient     Medication Adjustments/Labs and Tests Ordered: Current medicines are reviewed at length with the patient today.  Concerns regarding medicines are outlined above.  Tests Ordered:  None   Medication Changes:  None  Disposition:  Follow up in a year   Signed, Jenkins Rouge, MD  11/09/2021 9:08 AM    [place]

## 2021-11-09 ENCOUNTER — Encounter: Payer: Self-pay | Admitting: Cardiovascular Disease

## 2021-11-09 ENCOUNTER — Ambulatory Visit: Payer: Medicare Other | Attending: Cardiovascular Disease | Admitting: Cardiovascular Disease

## 2021-11-09 VITALS — BP 106/58 | HR 62 | Ht 60.0 in | Wt 95.0 lb

## 2021-11-09 DIAGNOSIS — I350 Nonrheumatic aortic (valve) stenosis: Secondary | ICD-10-CM | POA: Diagnosis not present

## 2021-11-09 NOTE — Patient Instructions (Signed)
Medication Instructions:  Your physician recommends that you continue on your current medications as directed. Please refer to the Current Medication list given to you today.  *If you need a refill on your cardiac medications before your next appointment, please call your pharmacy*  Lab Work: If you have labs (blood work) drawn today and your tests are completely normal, you will receive your results only by: [place] (if you have MyChart) OR A paper copy in the mail If you have any lab test that is abnormal or we need to change your treatment, we will call you to review the results.  Follow-Up: At [facility], you and your health needs are our priority.  As part of our continuing mission to provide you with exceptional heart care, we have created designated Provider Care Teams.  These Care Teams include your primary Cardiologist (physician) and Advanced Practice Providers (APPs -  Physician Assistants and Nurse Practitioners) who all work together to provide you with the care you need, when you need it.  We recommend signing up for the patient portal called "MyChart".  Sign up information is provided on this After Visit Summary.  MyChart is used to connect with patients for Virtual Visits (Telemedicine).  Patients are able to view lab/test results, encounter notes, upcoming appointments, etc.  Non-urgent messages can be sent to your provider as well.   To learn more about what you can do with MyChart, go to [email or web address].    Your next appointment:   As needed  The format for your next appointment:   In Person  Provider:   Jenkins Rouge, MD     Important Information About Sugar

## 2021-11-16 DIAGNOSIS — I110 Hypertensive heart disease with heart failure: Secondary | ICD-10-CM | POA: Diagnosis not present

## 2021-12-03 DIAGNOSIS — I5033 Acute on chronic diastolic (congestive) heart failure: Secondary | ICD-10-CM | POA: Diagnosis not present

## 2021-12-03 DIAGNOSIS — E039 Hypothyroidism, unspecified: Secondary | ICD-10-CM | POA: Diagnosis not present

## 2021-12-03 DIAGNOSIS — Z7189 Other specified counseling: Secondary | ICD-10-CM | POA: Diagnosis not present

## 2021-12-03 DIAGNOSIS — E872 Acidosis, unspecified: Secondary | ICD-10-CM | POA: Diagnosis not present

## 2021-12-03 DIAGNOSIS — E43 Unspecified severe protein-calorie malnutrition: Secondary | ICD-10-CM | POA: Diagnosis not present

## 2021-12-04 DIAGNOSIS — I5033 Acute on chronic diastolic (congestive) heart failure: Secondary | ICD-10-CM | POA: Diagnosis not present

## 2021-12-04 DIAGNOSIS — E43 Unspecified severe protein-calorie malnutrition: Secondary | ICD-10-CM | POA: Diagnosis not present

## 2021-12-04 DIAGNOSIS — E039 Hypothyroidism, unspecified: Secondary | ICD-10-CM | POA: Diagnosis not present

## 2021-12-04 DIAGNOSIS — Z7189 Other specified counseling: Secondary | ICD-10-CM | POA: Diagnosis not present

## 2021-12-04 DIAGNOSIS — E872 Acidosis, unspecified: Secondary | ICD-10-CM | POA: Diagnosis not present

## 2021-12-16 DIAGNOSIS — I4819 Other persistent atrial fibrillation: Secondary | ICD-10-CM | POA: Diagnosis not present

## 2021-12-21 ENCOUNTER — Telehealth: Payer: Self-pay

## 2021-12-21 NOTE — Telephone Encounter (Addendum)
   Patient Name: [name]  DOB: 1931-08-22 MRN: [id number]  Primary Cardiologist: Jenkins Rouge, MD  Chart reviewed as part of pre-operative protocol coverage. Complex PMH noted with prior CABG 2004, severe AS not a candidate for TAVR, mild-moderate MR, advanced dementia, DNR, bradycardia, dyslipidemia, CKD, GERD, HTN, thyroid disease, anemia, chronic HFpEF. Last OV 10/2021 appeared relatively stable. Given complex PMH and dementia, reached out to primary cardiologist for review. Per discussion with Dr. Johnsie Cancel, patient is at increased risk at baseline, but OK to proceed without further CV testing and can hold aspirin if needed for procedure.  She does not require pre-op antibiotics for SBE prophylaxis from cardiac standpoint.  Will route this bundled recommendation to requesting provider via Epic fax function ASAP. Please call with questions.    Charlie Pitter, PA-C 12/21/2021, 4:08 PM

## 2021-12-21 NOTE — Telephone Encounter (Signed)
Type of Anesthesia: Local.  They wants to know if patient needs to be premedicated.   They leave at 4:30 today and would like to have an answer.

## 2021-12-21 NOTE — Telephone Encounter (Signed)
Caller stated they will need to know today if the patient is cleared for the surgery tomorrow (12/6).

## 2021-12-21 NOTE — Telephone Encounter (Addendum)
   Patient Name: [name]  DOB: [birth year] MRN: [id number]  Primary Cardiologist: Jenkins Rouge, MD  Chart revisited as part of pre-operative protocol coverage. Received secure chat msg that DDS calling back to confirm recomendations. I called the number requested and was able to speak with the requesting caller. Unfortunately they did not receive our fax sent earlier. I relayed the information outlined in our documentation to [facility] that patient is at increased risk at baseline, but OK to proceed per Dr. Johnsie Cancel without further CV testing and can hold aspirin if needed for procedure, and no need for SBE prophylaxis. She states that it would be up to [place] as to whether we need to hold aspirin. I told her usually the dental office is the one who notifies [place] whether they need to hold aspirin or not based on the type of procedure (I.e. urgency), but for 9 dental extractions, the usual recommendation is to hold aspirin for 7 days prior to procedure. However, procedure date is listed for tomorrow and we just received urgent notification to our preop team this afternoon, therefore we will defer to the oral surgeon to review and decide if procedure needs to be rescheduled. Will fax this second update as well to the fax number listed on the website, [phone]. Please let us know if we can be of any further assistance. Thank you for reaching out.  Charlie Pitter, PA-C 12/21/2021, 4:44 PM

## 2021-12-21 NOTE — Telephone Encounter (Signed)
..     Pre-operative Risk Assessment    Patient Name: [name]  DOB: 19-Aug-1931 MRN: [id number]     Request for Surgical Clearance    Procedure:  Dental Extraction - Amount of Teeth to be Pulled:  9  Date of Surgery:  Clearance 12/21/21                             dr Ysidro Evert tegga    Surgeon:  dr Ysidro Evert tegga Surgeon's Group or Practice Name:  [facility] Phone number:  [phone] Fax number:  [phone]   Type of Clearance Requested:   - Medical  - Pharmacy:  Hold Aspirin     Type of Anesthesia:  Not Indicated   Additional requests/questions:    [name]   12/21/2021, 2:50 PM

## 2021-12-21 NOTE — Telephone Encounter (Signed)
Clearance can be cancel, one already in

## 2021-12-22 DIAGNOSIS — K029 Dental caries, unspecified: Secondary | ICD-10-CM | POA: Diagnosis not present

## 2021-12-22 DIAGNOSIS — K056 Periodontal disease, unspecified: Secondary | ICD-10-CM | POA: Diagnosis not present

## 2022-01-02 DIAGNOSIS — I5033 Acute on chronic diastolic (congestive) heart failure: Secondary | ICD-10-CM | POA: Diagnosis not present

## 2022-01-02 DIAGNOSIS — E872 Acidosis, unspecified: Secondary | ICD-10-CM | POA: Diagnosis not present

## 2022-01-02 DIAGNOSIS — E43 Unspecified severe protein-calorie malnutrition: Secondary | ICD-10-CM | POA: Diagnosis not present

## 2022-01-02 DIAGNOSIS — Z7189 Other specified counseling: Secondary | ICD-10-CM | POA: Diagnosis not present

## 2022-01-02 DIAGNOSIS — E039 Hypothyroidism, unspecified: Secondary | ICD-10-CM | POA: Diagnosis not present

## 2022-01-03 DIAGNOSIS — E43 Unspecified severe protein-calorie malnutrition: Secondary | ICD-10-CM | POA: Diagnosis not present

## 2022-01-03 DIAGNOSIS — E872 Acidosis, unspecified: Secondary | ICD-10-CM | POA: Diagnosis not present

## 2022-01-03 DIAGNOSIS — Z7189 Other specified counseling: Secondary | ICD-10-CM | POA: Diagnosis not present

## 2022-01-03 DIAGNOSIS — I5033 Acute on chronic diastolic (congestive) heart failure: Secondary | ICD-10-CM | POA: Diagnosis not present

## 2022-01-03 DIAGNOSIS — E039 Hypothyroidism, unspecified: Secondary | ICD-10-CM | POA: Diagnosis not present

## 2022-01-21 DIAGNOSIS — I129 Hypertensive chronic kidney disease with stage 1 through stage 4 chronic kidney disease, or unspecified chronic kidney disease: Secondary | ICD-10-CM | POA: Diagnosis not present

## 2022-01-21 DIAGNOSIS — R69 Illness, unspecified: Secondary | ICD-10-CM | POA: Diagnosis not present

## 2022-01-21 DIAGNOSIS — M810 Age-related osteoporosis without current pathological fracture: Secondary | ICD-10-CM | POA: Diagnosis not present

## 2022-01-21 DIAGNOSIS — I350 Nonrheumatic aortic (valve) stenosis: Secondary | ICD-10-CM | POA: Diagnosis not present

## 2022-01-21 DIAGNOSIS — E441 Mild protein-calorie malnutrition: Secondary | ICD-10-CM | POA: Diagnosis not present

## 2022-01-21 DIAGNOSIS — I5030 Unspecified diastolic (congestive) heart failure: Secondary | ICD-10-CM | POA: Diagnosis not present

## 2022-01-21 DIAGNOSIS — E559 Vitamin D deficiency, unspecified: Secondary | ICD-10-CM | POA: Diagnosis not present

## 2022-01-21 DIAGNOSIS — N1832 Chronic kidney disease, stage 3b: Secondary | ICD-10-CM | POA: Diagnosis not present

## 2022-01-21 DIAGNOSIS — G301 Alzheimer's disease with late onset: Secondary | ICD-10-CM | POA: Diagnosis not present

## 2022-01-21 DIAGNOSIS — E039 Hypothyroidism, unspecified: Secondary | ICD-10-CM | POA: Diagnosis not present

## 2022-02-02 DIAGNOSIS — Z7189 Other specified counseling: Secondary | ICD-10-CM | POA: Diagnosis not present

## 2022-02-02 DIAGNOSIS — E872 Acidosis, unspecified: Secondary | ICD-10-CM | POA: Diagnosis not present

## 2022-02-02 DIAGNOSIS — E039 Hypothyroidism, unspecified: Secondary | ICD-10-CM | POA: Diagnosis not present

## 2022-02-02 DIAGNOSIS — E43 Unspecified severe protein-calorie malnutrition: Secondary | ICD-10-CM | POA: Diagnosis not present

## 2022-02-02 DIAGNOSIS — I5033 Acute on chronic diastolic (congestive) heart failure: Secondary | ICD-10-CM | POA: Diagnosis not present

## 2022-02-03 DIAGNOSIS — E872 Acidosis, unspecified: Secondary | ICD-10-CM | POA: Diagnosis not present

## 2022-02-03 DIAGNOSIS — I5033 Acute on chronic diastolic (congestive) heart failure: Secondary | ICD-10-CM | POA: Diagnosis not present

## 2022-02-03 DIAGNOSIS — E039 Hypothyroidism, unspecified: Secondary | ICD-10-CM | POA: Diagnosis not present

## 2022-02-03 DIAGNOSIS — E43 Unspecified severe protein-calorie malnutrition: Secondary | ICD-10-CM | POA: Diagnosis not present

## 2022-02-03 DIAGNOSIS — Z7189 Other specified counseling: Secondary | ICD-10-CM | POA: Diagnosis not present

## 2022-02-15 ENCOUNTER — Emergency Department (HOSPITAL_COMMUNITY): Payer: Medicare HMO

## 2022-02-15 ENCOUNTER — Emergency Department (HOSPITAL_COMMUNITY)
Admission: EM | Admit: 2022-02-15 | Discharge: 2022-02-15 | Disposition: A | Discharge status: Home / Self Care | Payer: Medicare HMO | Attending: Emergency Medicine | Admitting: Emergency Medicine

## 2022-02-15 ENCOUNTER — Other Ambulatory Visit: Payer: Self-pay

## 2022-02-15 DIAGNOSIS — R2243 Localized swelling, mass and lump, lower limb, bilateral: Secondary | ICD-10-CM | POA: Diagnosis not present

## 2022-02-15 DIAGNOSIS — I499 Cardiac arrhythmia, unspecified: Secondary | ICD-10-CM | POA: Diagnosis not present

## 2022-02-15 DIAGNOSIS — R0602 Shortness of breath: Secondary | ICD-10-CM | POA: Diagnosis not present

## 2022-02-15 DIAGNOSIS — R079 Chest pain, unspecified: Secondary | ICD-10-CM | POA: Diagnosis not present

## 2022-02-15 DIAGNOSIS — R410 Disorientation, unspecified: Secondary | ICD-10-CM | POA: Diagnosis not present

## 2022-02-15 DIAGNOSIS — F0390 Unspecified dementia without behavioral disturbance: Secondary | ICD-10-CM | POA: Diagnosis not present

## 2022-02-15 DIAGNOSIS — Z743 Need for continuous supervision: Secondary | ICD-10-CM | POA: Diagnosis not present

## 2022-02-15 DIAGNOSIS — R0902 Hypoxemia: Secondary | ICD-10-CM | POA: Diagnosis not present

## 2022-02-15 DIAGNOSIS — R69 Illness, unspecified: Secondary | ICD-10-CM | POA: Diagnosis not present

## 2022-02-15 DIAGNOSIS — I350 Nonrheumatic aortic (valve) stenosis: Secondary | ICD-10-CM | POA: Diagnosis not present

## 2022-02-15 DIAGNOSIS — R531 Weakness: Secondary | ICD-10-CM | POA: Insufficient documentation

## 2022-02-15 DIAGNOSIS — F32A Depression, unspecified: Secondary | ICD-10-CM | POA: Diagnosis not present

## 2022-02-15 DIAGNOSIS — J449 Chronic obstructive pulmonary disease, unspecified: Secondary | ICD-10-CM | POA: Diagnosis not present

## 2022-02-15 DIAGNOSIS — Z7982 Long term (current) use of aspirin: Secondary | ICD-10-CM | POA: Insufficient documentation

## 2022-02-15 DIAGNOSIS — I213 ST elevation (STEMI) myocardial infarction of unspecified site: Secondary | ICD-10-CM | POA: Diagnosis not present

## 2022-02-15 LAB — CBC
HCT: 35.1 % — ABNORMAL LOW (ref 36.0–46.0)
Hemoglobin: 11.9 g/dL — ABNORMAL LOW (ref 12.0–15.0)
MCH: 32.2 pg (ref 26.0–34.0)
MCHC: 33.9 g/dL (ref 30.0–36.0)
MCV: 94.9 fL (ref 80.0–100.0)
Platelets: 231 10*3/uL (ref 150–400)
RBC: 3.7 MIL/uL — ABNORMAL LOW (ref 3.87–5.11)
RDW: 12.3 % (ref 11.5–15.5)
WBC: 8.1 10*3/uL (ref 4.0–10.5)
nRBC: 0 % (ref 0.0–0.2)

## 2022-02-15 LAB — BRAIN NATRIURETIC PEPTIDE: B Natriuretic Peptide: 938.3 pg/mL — ABNORMAL HIGH (ref 0.0–100.0)

## 2022-02-15 LAB — BASIC METABOLIC PANEL
Anion gap: 16 — ABNORMAL HIGH (ref 5–15)
BUN: 35 mg/dL — ABNORMAL HIGH (ref 8–23)
CO2: 23 mmol/L (ref 22–32)
Calcium: 9.7 mg/dL (ref 8.9–10.3)
Chloride: 99 mmol/L (ref 98–111)
Creatinine, Ser: 1.97 mg/dL — ABNORMAL HIGH (ref 0.44–1.00)
GFR, Estimated: 24 mL/min — ABNORMAL LOW (ref >60)
Glucose, Bld: 94 mg/dL (ref 70–99)
Potassium: 3.8 mmol/L (ref 3.5–5.1)
Sodium: 138 mmol/L (ref 135–145)

## 2022-02-15 LAB — TROPONIN I (HIGH SENSITIVITY)
Troponin I (High Sensitivity): 24 ng/L — ABNORMAL HIGH (ref <18)
Troponin I (High Sensitivity): 23 ng/L — ABNORMAL HIGH (ref <18)

## 2022-02-15 NOTE — ED Notes (Signed)
ED Provider at bedside. 

## 2022-02-15 NOTE — ED Provider Triage Note (Signed)
Emergency Medicine Provider Triage Evaluation Note  [name] , a 89 y.o. female  was evaluated in triage.  Patient with history of dementia, she denies any symptoms. However, according to EMS, patient presents with concern for generalized weakness and shortness of breath since last night. Patient denies any complaints, states "I feel great." Alert and oriented to self only which is her baseline.  Review of Systems  Positive:  Negative:   Physical Exam  BP (!) 112/55 (BP Location: Right Arm)   Pulse 60   Temp 97.7 F (36.5 C) (Oral)   Resp 16   SpO2 100%  Gen:   Awake, no distress   Resp:  Normal effort  MSK:   Moves extremities without difficulty  Other:    Medical Decision Making  Medically screening exam initiated at 4:06 AM.  Appropriate orders placed.  JANASHIA PARCO was informed that the remainder of the evaluation will be completed by another provider, this initial triage assessment does not replace that evaluation, and the importance of remaining in the ED until their evaluation is complete.     Bud Face, PA-C 02/15/22 [phone]

## 2022-02-15 NOTE — ED Triage Notes (Signed)
Patient arrived with EMS from home reports SOB with generalized weakness onset last night , she received ASA 324 mg by EMS prior to arrival .

## 2022-02-15 NOTE — ED Provider Notes (Signed)
[facility] Provider Note   CSN: [id number] Arrival date & time: 02/15/22  0272     History  Chief Complaint  Patient presents with   SOB / Gen. Weakness    [name] is a 89 y.o. female.  Level 5 caveat secondary to dementia.  She is brought in by ambulance early this morning for evaluation of shortness of breath and weakness.  She lives with her son [name].  She is not sure why she is here now but when prompted she does say she gets short of breath on and off.  She does not feel short of breath now.  She denies any chest pain or abdominal pain, no cough.  Triage note also reports general weakness although patient denies at this time.  She does not think she has been running a fever.  The history is provided by the patient.  Shortness of Breath Progression:  Resolved Chronicity:  Recurrent Relieved by:  None tried Associated symptoms: no chest pain, no cough, no fever and no sputum production   Risk factors: no tobacco use        Home Medications Prior to Admission medications   Medication Sig Start Date End Date Taking? Authorizing Provider  acetaminophen (TYLENOL) 500 MG tablet Take 1,000 mg by mouth every 6 (six) hours as needed for mild pain.    [provider]  aspirin 81 MG chewable tablet Chew 81 mg by mouth daily.    [provider]  carvedilol (COREG) 3.125 MG tablet Take 1 tablet (3.125 mg total) by mouth 2 (two) times daily. 07/12/21 07/13/22  Swinyer, Lanice Schwab, NP  Cholecalciferol (VITAMIN D-3) 25 MCG (1000 UT) CAPS Take 1,000 Units by mouth daily.    [provider]  ferrous sulfate 325 (65 FE) MG tablet Take 325 mg by mouth at bedtime.    [provider]  furosemide (LASIX) 40 MG tablet Take 1 tablet (40 mg total) by mouth 2 (two) times daily. 05/03/21   Nita Sells, MD  levothyroxine (SYNTHROID, LEVOTHROID) 75 MCG tablet Take 75 mcg by mouth at bedtime.    [provider]  Multiple Vitamin (MULTIVITAMIN WITH MINERALS) TABS tablet Take 1 tablet by mouth daily.    [provider]  vitamin B-12 (CYANOCOBALAMIN) 250 MCG tablet Take 250 mcg by mouth daily.    [provider]      Allergies    Aleve [naproxen sodium], Aleve [naproxen], Crestor [rosuvastatin], Excedrin extra strength [aspirin-acetaminophen-caffeine], Meloxicam, Statins, and Vytorin [ezetimibe-simvastatin]    Review of Systems   Review of Systems  Unable to perform ROS: Dementia  Constitutional:  Negative for fever.  Respiratory:  Positive for shortness of breath. Negative for cough and sputum production.   Cardiovascular:  Negative for chest pain.    Physical Exam Updated Vital Signs BP 117/72   Pulse 64   Temp 97.9 F (36.6 C)   Resp 16   SpO2 100%  Physical Exam Vitals and nursing note reviewed.  Constitutional:      General: She is not in acute distress.    Appearance: Normal appearance. She is well-developed.  HENT:     Head: Normocephalic and atraumatic.  Eyes:     Conjunctiva/sclera: Conjunctivae normal.  Cardiovascular:     Rate and Rhythm: Normal rate and regular rhythm.     Heart sounds: Murmur heard.  Pulmonary:     Effort: Pulmonary effort is normal. No respiratory distress.  Breath sounds: Normal breath sounds.  Abdominal:     Palpations: Abdomen is soft.     Tenderness: There is no abdominal tenderness.  Musculoskeletal:        General: No deformity.     Cervical back: Neck supple.     Right lower leg: Edema present.     Left lower leg: Edema present.  Skin:    General: Skin is warm and dry.     Capillary Refill: Capillary refill takes less than 2 seconds.  Neurological:     General: No focal deficit present.     Mental Status: She is alert. Mental status is at baseline. She is disoriented.  Psychiatric:        Mood and Affect: Mood normal.     ED Results / Procedures / Treatments   Labs (all labs ordered are listed,  but only abnormal results are displayed) Labs Reviewed  BASIC METABOLIC PANEL - Abnormal; Notable for the following components:      Result Value   BUN 35 (*)    Creatinine, Ser 1.97 (*)    GFR, Estimated 24 (*)    Anion gap 16 (*)    All other components within normal limits  CBC - Abnormal; Notable for the following components:   RBC 3.70 (*)    Hemoglobin 11.9 (*)    HCT 35.1 (*)    All other components within normal limits  BRAIN NATRIURETIC PEPTIDE - Abnormal; Notable for the following components:   B Natriuretic Peptide 938.3 (*)    All other components within normal limits  TROPONIN I (HIGH SENSITIVITY) - Abnormal; Notable for the following components:   Troponin I (High Sensitivity) 24 (*)    All other components within normal limits  TROPONIN I (HIGH SENSITIVITY) - Abnormal; Notable for the following components:   Troponin I (High Sensitivity) 23 (*)    All other components within normal limits    EKG EKG Interpretation  Date/Time:  Tuesday February 15 2022 03:23:00 EST Ventricular Rate:  60 PR Interval:  166 QRS Duration: 92 QT Interval:  458 QTC Calculation: 458 R Axis:   27 Text Interpretation: Normal sinus rhythm Moderate voltage criteria for LVH, may be normal variant ( Sokolow-Lyon , Cornell product ) ST & Marked T-wave abnormality, consider inferolateral ischemia Abnormal ECG When compared with ECG of 02-May-2021 11:28, ST changes sl worse than prior Confirmed by Aletta Edouard [phone]) on 02/15/2022 10:03:47 AM  Radiology DG Chest 2 View  Result Date: 02/15/2022 CLINICAL DATA:  Chest pain. EXAM: CHEST - 2 VIEW COMPARISON:  05/01/2021. FINDINGS: The heart is enlarged and mediastinal contours are within normal limits. There is atherosclerotic calcification of the aorta. There is hyperinflation of the lungs with increased AP diameter of the chest. No consolidation, effusion, or pneumothorax. Sternotomy wires are noted over the midline. No acute osseous  abnormality. IMPRESSION: 1. No active cardiopulmonary disease. 2. Chronic obstructive pulmonary disease. 3. Cardiomegaly. Electronically Signed   By: Brett Fairy M.D.   On: 02/15/2022 04:36    Procedures Procedures    Medications Ordered in ED Medications - No data to display  ED Course/ Medical Decision Making/ A&P Clinical Course as of 02/15/22 1725  Tue Feb 15, 2022  1012 Prior cardiology notes, and upon patient's severe aortic stenosis.  They do not feel she is a TAVR candidate due to her comorbid illness and age and had been managing her with medications including diuresis. [MB]  15 I called patient's son [name] that  she lives with.  He said she had shortness of breath starting around 1 AM and when she asked for the ambulance to be called which is unusual for her.  He said she was actually feeling better before the ambulance even got there.  I told him we would get her something to eat and ambulate her and he said he could come by and pick her up and a bit if she is being discharged. [MB]  0973 Patient was able to ambulate with nursing no desaturations.  Son is going to come up to pick her up [MB]    Clinical Course User Index [MB] Hayden Rasmussen, MD                             Medical Decision Making  This patient complains of shortness of breath possible chest pain; this involves an extensive number of treatment Options and is a complaint that carries with it a high risk of complications and morbidity. The differential includes CHF, COPD, pneumonia, pneumothorax, PE, anemia  I ordered, reviewed and interpreted labs, which included CBC with normal white count, hemoglobin low stable from priors, chemistries with chronic CKD, BNP mildly elevated, troponins mildly elevated I ordered imaging studies which included chest x-ray and I independently    visualized and interpreted imaging which showed cardiomegaly Additional history obtained from patient's son Previous records  obtained and reviewed in epic including recent PCP visits Cardiac monitoring reviewed, normal sinus rhythm Social determinants considered, no significant barriers Critical Interventions: None  After the interventions stated above, I reevaluated the patient and found patient to be breathing comfortably on room air no distress Admission and further testing considered, no indications for admission at this time.  Recommended close follow-up with PCP and continuation of current medications.  Return instructions discussed         Final Clinical Impression(s) / ED Diagnoses Final diagnoses:  Shortness of breath  Aortic valve stenosis, etiology of cardiac valve disease unspecified    Rx / DC Orders ED Discharge Orders     None         Hayden Rasmussen, MD 02/15/22 1728

## 2022-02-15 NOTE — Discharge Instructions (Addendum)
You were seen in the emergency department for evaluation of an episode of shortness of breath.  Your oxygen level was stable here and you had blood work chest x-ray and EKG that did not show any significant changes from your prior.  Please continue current medications and follow-up with your primary care doctor and heart doctor.  Return to the emergency department if any worsening or concerning symptoms

## 2022-02-28 ENCOUNTER — Telehealth: Payer: Self-pay | Admitting: Cardiovascular Disease

## 2022-02-28 NOTE — Telephone Encounter (Signed)
Pt c/o Shortness Of Breath: STAT if SOB developed within the last 24 hours or pt is noticeably SOB on the phone  1. Are you currently SOB (can you hear that pt is SOB on the phone)? No   2. How long have you been experiencing SOB? A week ago   3. Are you SOB when sitting or when up moving around? Sitting   4. Are you currently experiencing any other symptoms? Gets really cold, feels she is going to faint, BP won't register     Patient's son is calling to report the patient has had two episodes of SOB where she gets cold, feels she is going to faint and her BP won't register. He reports the first incident occurred on 01/30. EMS was called, but the patient's vitals returned back to normal by the time she arrived at the hospital. Per pt's son they were unable to find anything wrong. He reports the second episode occurred yesterday with the same symptoms. He states it lasted 5 to 10 mins before she returned to normal and the patient requested EMS not be called. Please advise.

## 2022-02-28 NOTE — Telephone Encounter (Signed)
Spoke with son who states patient had an episode of SOB with near syncope. This is the second episode, she also had this on 02/15/22 and went to ED for evaluation and returned back home.  Son states he was unable to register a BP initially but finally go a BP of 113/52 and HR of 52, which is baseline for patient. He did put oxygen on her and states this did not improve her symptoms. Denies chest pain and nausea.   Son agreed to bring patient in to see doctor of day on 03/02/22 for further evaluation. Advised if symptoms return and especially if worsen to go back to ED or call EMS for urgent evaluation. Son Patient verbalized understanding and had no questions.

## 2022-03-02 ENCOUNTER — Encounter: Payer: Self-pay | Admitting: Internal Medicine

## 2022-03-02 ENCOUNTER — Ambulatory Visit: Payer: Medicare HMO | Attending: Internal Medicine | Admitting: Internal Medicine

## 2022-03-02 VITALS — BP 130/64 | HR 53 | Ht 60.0 in | Wt 104.6 lb

## 2022-03-02 DIAGNOSIS — N183 Chronic kidney disease, stage 3 unspecified: Secondary | ICD-10-CM | POA: Diagnosis not present

## 2022-03-02 DIAGNOSIS — R0602 Shortness of breath: Secondary | ICD-10-CM | POA: Diagnosis not present

## 2022-03-02 DIAGNOSIS — Z7189 Other specified counseling: Secondary | ICD-10-CM | POA: Diagnosis not present

## 2022-03-02 DIAGNOSIS — I2510 Atherosclerotic heart disease of native coronary artery without angina pectoris: Secondary | ICD-10-CM | POA: Diagnosis not present

## 2022-03-02 DIAGNOSIS — R69 Illness, unspecified: Secondary | ICD-10-CM | POA: Diagnosis not present

## 2022-03-02 DIAGNOSIS — I350 Nonrheumatic aortic (valve) stenosis: Secondary | ICD-10-CM

## 2022-03-02 DIAGNOSIS — F0390 Unspecified dementia without behavioral disturbance: Secondary | ICD-10-CM

## 2022-03-02 NOTE — Progress Notes (Signed)
Cardiology Office Note:    Date:  03/02/2022   ID:  JAZZLEEN SCHINK, DOB [birth year], MRN [id number]  PCP:  Lajean Manes, MD   [facility] Cardiologist:  Jenkins Rouge, MD     Referring MD: Lajean Manes, MD   CC: DOD SOB  History of Present Illness:    [name] is a 89 y.o. female with a hx of CAD s/p CABG, SOB with Severe AS non a candidate for TAVR, LE edmea last seen 2023.  Patient notes that she is doing well now.   Son notes that a week ago, she was having a spell of weakness, decreased mentation, and SOB.  She told them to call an ambulance. They weren't able to give her a BP at home. She felt better by this time. She was able to walk to the ambulance.  Had benign work up.    Has persistent left leg swelling from a prior fracture. Leg swelling was worse so she has had an increase in leg swelling. Has some baseline CKD.  No chest pain or pressure.   No palpitations or syncope .  Ambulatory blood pressure 90/110-60/70.   Past Medical History:  Diagnosis Date   Anemia    Aortic stenosis    mild AS by 02/18/11  [name]   Arthritis    Bradycardia    Asymtomatic resolved   Cancer ([place])    ceervical   Chronic renal disease, stage III ([place])    Coronary artery disease    CABG 2003   Dyslipidemia    goal LSL less then 70 but statin intolerant   GERD (gastroesophageal reflux disease)    Hypertension    Hypothyroidism    Iron deficiency anemia 07/18/2010   Mild AI (aortic insufficiency)    Mild aortic stenosis    OP (osteoporosis) 10/18/2010   fosamax   Statin intolerance     Past Surgical History:  Procedure Laterality Date   ABDOMINAL HYSTERECTOMY     CARDIAC CATHETERIZATION     2004   cataracts     COLONOSCOPY  09/2010   normal EGD abd COLONOSCOPY   CORONARY ARTERY BYPASS GRAFT  2004   with LIMA to LAD, SVG to diagonal, SVG to OM, SOVG to PDA normal LVF   HIP ARTHROPLASTY Left 03/23/2020   Procedure: ARTHROPLASTY BIPOLAR HIP  (HEMIARTHROPLASTY) anterior approach;  Surgeon: Rod Can, MD;  Location: [facility];  Service: Orthopedics;  Laterality: Left;   LUMBAR LAMINECTOMY  09/23/2011   Procedure: MICRODISCECTOMY LUMBAR LAMINECTOMY;  Surgeon: Marybelle Killings, MD;  Location: [facility];  Service: Orthopedics;  Laterality: N/A;  L4-5 Decompression    Current Medications: Current Meds  Medication Sig   acetaminophen (TYLENOL) 500 MG tablet Take 1,000 mg by mouth every 6 (six) hours as needed for mild pain.   aspirin 81 MG chewable tablet Chew 81 mg by mouth daily.   ferrous sulfate 325 (65 FE) MG tablet Take 325 mg by mouth at bedtime.   furosemide (LASIX) 40 MG tablet Take 1 tablet (40 mg total) by mouth 2 (two) times daily.   levothyroxine (SYNTHROID, LEVOTHROID) 75 MCG tablet Take 75 mcg by mouth at bedtime.   Multiple Vitamin (MULTIVITAMIN WITH MINERALS) TABS tablet Take 1 tablet by mouth daily.   vitamin B-12 (CYANOCOBALAMIN) 250 MCG tablet Take 250 mcg by mouth daily.   [DISCONTINUED] carvedilol (COREG) 3.125 MG tablet Take 1 tablet (3.125 mg total) by mouth 2 (two) times daily.  Allergies:   Aleve [naproxen sodium], Aleve [naproxen], Crestor [rosuvastatin], Excedrin extra strength [aspirin-acetaminophen-caffeine], Meloxicam, Statins, and Vytorin [ezetimibe-simvastatin]   Social History   Socioeconomic History   Marital status: Widowed    Spouse name: Not on file   Number of children: Not on file   Years of education: Not on file   Highest education level: Not on file  Occupational History   Not on file  Tobacco Use   Smoking status: Never   Smokeless tobacco: Never  Vaping Use   Vaping Use: Never used  Substance and Sexual Activity   Alcohol use: No   Drug use: No   Sexual activity: Not on file  Other Topics Concern   Not on file  Social History Narrative   Not on file   Social Determinants of Health   Financial Resource Strain: Not on file  Food Insecurity: Not on file  Transportation Needs:  Not on file  Physical Activity: Not on file  Stress: Not on file  Social Connections: Not on file     Family History: The patient's family history includes Asthma in her father; Heart Problems in her mother.  ROS:   Please see the history of present illness.     All other systems reviewed and are negative.  EKGs/Labs/Other Studies Reviewed:    The following studies were reviewed today:  EKG:   03/02/22: Sinus bradycardia LVH with secondary repolarization with prominent lateral TWI stable from ED visit  Cardiac Studies & Procedures       ECHOCARDIOGRAM  ECHOCARDIOGRAM COMPLETE 11/28/2019  Narrative ECHOCARDIOGRAM REPORT    Patient Name:   [name] Date of Exam: 11/28/2019 Medical Rec #:  [id number]        Height:       60.0 in Accession #:    [id number]       Weight:       90.0 lb Date of Birth:  10-05-1931        BSA:          1.329 m Patient Age:    76 years         BP:           138/74 mmHg Patient Gender: F                HR:           82 bpm. Exam Location:  [address]  Procedure: 2D Echo, 3D Echo, Cardiac Doppler, Color Doppler and Strain Analysis  Indications:    I35 Aortic stenosis.  History:        Patient has prior history of Echocardiogram examinations, most recent 04/30/2019. CAD, Aortic Valve Disease; Risk Factors:Hypertension and Dyslipidemia.  Sonographer:    Jessee Avers, RDCS Referring Phys: [place]   1. The aortic valve is abnormal. There is severe calcifcation of the aortic valve. Aortic valve regurgitation is mild. Severe aortic valve stenosis. Aortic valve area, by VTI measures 0.40 cm. Aortic valve mean gradient measures 53.0 mmHg. Aortic valve Vmax measures 4.16 m/s. 2. Left ventricular ejection fraction, by estimation, is 55 to 60%. The left ventricle has normal function. The left ventricle has no regional wall motion abnormalities. Left ventricular diastolic parameters are consistent with Grade II  diastolic dysfunction (pseudonormalization). Elevated left ventricular end-diastolic pressure. The average left ventricular global longitudinal strain is -20.1 %. The global longitudinal strain is normal. 3. Right ventricular systolic function is normal. The right ventricular size is normal.  There is mildly elevated pulmonary artery systolic pressure. The estimated right ventricular systolic pressure is 123XX123 mmHg. 4. Left atrial size was mildly dilated. 5. Right atrial size was mildly dilated. 6. The mitral valve is grossly normal. Mild to moderate mitral valve regurgitation. No evidence of mitral stenosis. 7. The inferior vena cava is dilated in size with >50% respiratory variability, suggesting right atrial pressure of 8 mmHg.  Comparison(s): A prior study was performed on 04/30/19. Prior images reviewed side by side. Increase in systolic mean gradient through aortic valve, no other significant changes.  FINDINGS Left Ventricle: Left ventricular ejection fraction, by estimation, is 55 to 60%. The left ventricle has normal function. The left ventricle has no regional wall motion abnormalities. The average left ventricular global longitudinal strain is -20.1 %. The global longitudinal strain is normal. The left ventricular internal cavity size was normal in size. There is no left ventricular hypertrophy. Left ventricular diastolic parameters are consistent with Grade II diastolic dysfunction (pseudonormalization). Elevated left ventricular end-diastolic pressure.  Right Ventricle: The right ventricular size is normal. No increase in right ventricular wall thickness. Right ventricular systolic function is normal. There is mildly elevated pulmonary artery systolic pressure. The tricuspid regurgitant velocity is 2.77 m/s, and with an assumed right atrial pressure of 8 mmHg, the estimated right ventricular systolic pressure is 123XX123 mmHg.  Left Atrium: Left atrial size was mildly dilated.  Right  Atrium: Right atrial size was mildly dilated.  Pericardium: There is no evidence of pericardial effusion.  Mitral Valve: The mitral valve is grossly normal. Mild mitral annular calcification. Mild to moderate mitral valve regurgitation. No evidence of mitral valve stenosis.  Tricuspid Valve: The tricuspid valve is normal in structure. Tricuspid valve regurgitation is mild . No evidence of tricuspid stenosis.  Aortic Valve: The aortic valve is abnormal. There is severe calcifcation of the aortic valve. Aortic valve regurgitation is mild. Aortic regurgitation PHT measures 547 msec. Severe aortic stenosis is present. Aortic valve mean gradient measures 53.0 mmHg. Aortic valve peak gradient measures 69.3 mmHg. Aortic valve area, by VTI measures 0.40 cm.  Pulmonic Valve: The pulmonic valve was normal in structure. Pulmonic valve regurgitation is trivial. No evidence of pulmonic stenosis.  Aorta: The aortic root and ascending aorta are structurally normal, with no evidence of dilitation.  Venous: The inferior vena cava is dilated in size with greater than 50% respiratory variability, suggesting right atrial pressure of 8 mmHg.  IAS/Shunts: No atrial level shunt detected by color flow Doppler.   LEFT VENTRICLE PLAX 2D LVIDd:         4.60 cm  Diastology LVIDs:         3.70 cm  LV e' medial:    4.73 cm/s LV PW:         0.70 cm  LV E/e' medial:  24.9 LV IVS:        0.60 cm  LV e' lateral:   3.26 cm/s LVOT diam:     2.00 cm  LV E/e' lateral: 36.2 LV SV:         43 LV SV Index:   33       2D Longitudinal Strain LVOT Area:     3.14 cm 2D Strain GLS (A2C):   -20.2 % 2D Strain GLS (A3C):   -19.5 % 2D Strain GLS (A4C):   -20.6 % 2D Strain GLS Avg:     -20.1 %  3D Volume EF: 3D EF:        47 %  LV EDV:       135 ml LV ESV:       72 ml LV SV:        63 ml  RIGHT VENTRICLE RV Basal diam:  3.40 cm RV S prime:     7.60 cm/s TAPSE (M-mode): 2.4 cm RVSP:           38.7 mmHg  LEFT ATRIUM              Index       RIGHT ATRIUM           Index LA diam:        4.10 cm 3.08 cm/m  RA Pressure: 8.00 mmHg LA Vol (A2C):   52.3 ml 39.34 ml/m RA Area:     15.20 cm LA Vol (A4C):   40.3 ml 30.31 ml/m RA Volume:   38.70 ml  29.11 ml/m LA Biplane Vol: 50.9 ml 38.29 ml/m AORTIC VALVE AV Area (Vmax):    0.51 cm AV Area (Vmean):   0.46 cm AV Area (VTI):     0.40 cm AV Vmax:           416.33 cm/s AV Vmean:          301.200 cm/s AV VTI:            1.081 m AV Peak Grad:      69.3 mmHg AV Mean Grad:      53.0 mmHg LVOT Vmax:         68.02 cm/s LVOT Vmean:        44.080 cm/s LVOT VTI:          0.138 m LVOT/AV VTI ratio: 0.13 AI PHT:            547 msec  AORTA Ao Root diam: 3.10 cm Ao Asc diam:  3.30 cm  MITRAL VALVE                TRICUSPID VALVE TR Peak grad:   30.7 mmHg TR Vmax:        277.00 cm/s MV E velocity: 118.00 cm/s  Estimated RAP:  8.00 mmHg MV A velocity: 50.70 cm/s   RVSP:           38.7 mmHg MV E/A ratio:  2.33 SHUNTS Systemic VTI:  0.14 m Systemic Diam: 2.00 cm  Cherlynn Kaiser MD Electronically signed by Cherlynn Kaiser MD Signature Date/Time: 11/28/2019/9:34:17 PM    Final             Recent Labs: 05/03/2021: ALT 12 02/15/2022: B Natriuretic Peptide 938.3; BUN 35; Creatinine, Ser 1.97; Hemoglobin 11.9; Platelets 231; Potassium 3.8; Sodium 138  Recent Lipid Panel    Component Value Date/Time   CHOL 208 (H) 03/24/2014 1008   TRIG 129.0 03/24/2014 1008   HDL 44.80 03/24/2014 1008   CHOLHDL 5 03/24/2014 1008   VLDL 25.8 03/24/2014 1008   LDLCALC 137 (H) 03/24/2014 1008       Physical Exam:    VS:  BP 130/64   Pulse (!) 53   Ht 5' (1.524 m)   Wt 104 lb 9.6 oz (47.4 kg)   SpO2 93%   BMI 20.43 kg/m     Wt Readings from Last 3 Encounters:  03/02/22 104 lb 9.6 oz (47.4 kg)  11/09/21 95 lb (43.1 kg)  09/15/21 99 lb 9.6 oz (45.2 kg)    GEN:  Elderly female NAD HEENT: Normal CARDIAC: RRR, III/VI harsh systolic murmur RESPIRATORY:  Clear to  auscultation without rales, wheezing or rhonchi  ABDOMEN: Soft, non-tender, non-distended MUSCULOSKELETAL:  trace edema  edema; No deformity  SKIN: Warm and dry NEUROLOGIC:  Alert and oriented x 3 PSYCHIATRIC:  Normal affect   ASSESSMENT:    1. Severe aortic stenosis   2. Coronary artery disease involving native coronary artery of native heart without angina pectoris   3. Stage 3 chronic kidney disease, unspecified whether stage 3a or 3b CKD ([place])   4. Dementia without behavioral disturbance (HCC)    PLAN:    Severe aortic stenosis CAD s/p CABG Dementia Hypotension - I worry that this is the end stage of severe aortic stenosis, her beta blocker may inhibit her contractility and, as she is not a candidate for TAVR or SAVR will stop this to see if her BP improves - will get BNP and CMP; may need increase in diuretics - long term we discussed the mortality of untreated severe AS with her and her son  Will get Three months f/u wit Dr. Johnsie Cancel or his team       Medication Adjustments/Labs and Tests Ordered: Current medicines are reviewed at length with the patient today.  Concerns regarding medicines are outlined above.  Orders Placed This Encounter  Procedures   Comprehensive metabolic panel   Pro b natriuretic peptide (BNP)   EKG 12-Lead   No orders of the defined types were placed in this encounter.   Patient Instructions  Medication Instructions:  Your physician has recommended you make the following change in your medication:  STOP: carvedilol (Coreg)   *If you need a refill on your cardiac medications before your next appointment, please call your pharmacy*   Lab Work: TODAY: CMP and Pro BNP  If you have labs (blood work) drawn today and your tests are completely normal, you will receive your results only by: [place] (if you have MyChart) OR A paper copy in the mail If you have any lab test that is abnormal or we need to change your treatment, we will  call you to review the results.   Testing/Procedures: NONE   Follow-Up: At [facility], you and your health needs are our priority.  As part of our continuing mission to provide you with exceptional heart care, we have created designated Provider Care Teams.  These Care Teams include your primary Cardiologist (physician) and Advanced Practice Providers (APPs -  Physician Assistants and Nurse Practitioners) who all work together to provide you with the care you need, when you need it.  We recommend signing up for the patient portal called "MyChart".  Sign up information is provided on this After Visit Summary.  MyChart is used to connect with patients for Virtual Visits (Telemedicine).  Patients are able to view lab/test results, encounter notes, upcoming appointments, etc.  Non-urgent messages can be sent to your provider as well.   To learn more about what you can do with MyChart, go to [email or web address].    Your next appointment:   3 month(s)  Provider:   Jenkins Rouge, MD  or Nicholes Rough, PA-C, Melina Copa, PA-C, or Richardson Dopp, PA-C        Signed, Werner Lean, MD  03/02/2022 10:30 AM    [place]

## 2022-03-02 NOTE — Patient Instructions (Signed)
Medication Instructions:  Your physician has recommended you make the following change in your medication:  STOP: carvedilol (Coreg)   *If you need a refill on your cardiac medications before your next appointment, please call your pharmacy*   Lab Work: TODAY: CMP and Pro BNP  If you have labs (blood work) drawn today and your tests are completely normal, you will receive your results only by: [place] (if you have MyChart) OR A paper copy in the mail If you have any lab test that is abnormal or we need to change your treatment, we will call you to review the results.   Testing/Procedures: NONE   Follow-Up: At [facility], you and your health needs are our priority.  As part of our continuing mission to provide you with exceptional heart care, we have created designated Provider Care Teams.  These Care Teams include your primary Cardiologist (physician) and Advanced Practice Providers (APPs -  Physician Assistants and Nurse Practitioners) who all work together to provide you with the care you need, when you need it.  We recommend signing up for the patient portal called "MyChart".  Sign up information is provided on this After Visit Summary.  MyChart is used to connect with patients for Virtual Visits (Telemedicine).  Patients are able to view lab/test results, encounter notes, upcoming appointments, etc.  Non-urgent messages can be sent to your provider as well.   To learn more about what you can do with MyChart, go to [email or web address].    Your next appointment:   3 month(s)  Provider:   Jenkins Rouge, MD  or Nicholes Rough, PA-C, Melina Copa, PA-C, or Richardson Dopp, [place]

## 2022-03-03 LAB — COMPREHENSIVE METABOLIC PANEL
ALT: 9 IU/L (ref 0–32)
AST: 19 IU/L (ref 0–40)
Albumin/Globulin Ratio: 2 (ref 1.2–2.2)
Albumin: 4.3 g/dL (ref 3.7–4.7)
Alkaline Phosphatase: 64 IU/L (ref 44–121)
BUN/Creatinine Ratio: 14 (ref 12–28)
BUN: 25 mg/dL (ref 8–27)
Bilirubin Total: 0.3 mg/dL (ref 0.0–1.2)
CO2: 27 mmol/L (ref 20–29)
Calcium: 9.7 mg/dL (ref 8.7–10.3)
Chloride: 100 mmol/L (ref 96–106)
Creatinine, Ser: 1.85 mg/dL — ABNORMAL HIGH (ref 0.57–1.00)
Globulin, Total: 2.2 g/dL (ref 1.5–4.5)
Glucose: 75 mg/dL (ref 70–99)
Potassium: 4.4 mmol/L (ref 3.5–5.2)
Sodium: 142 mmol/L (ref 134–144)
Total Protein: 6.5 g/dL (ref 6.0–8.5)
eGFR: 26 mL/min/{1.73_m2} — ABNORMAL LOW (ref >59)

## 2022-03-03 LAB — PRO B NATRIURETIC PEPTIDE: NT-Pro BNP: 16078 pg/mL — ABNORMAL HIGH (ref 0–738)

## 2022-03-05 DIAGNOSIS — E039 Hypothyroidism, unspecified: Secondary | ICD-10-CM | POA: Diagnosis not present

## 2022-03-05 DIAGNOSIS — I5033 Acute on chronic diastolic (congestive) heart failure: Secondary | ICD-10-CM | POA: Diagnosis not present

## 2022-03-05 DIAGNOSIS — E872 Acidosis, unspecified: Secondary | ICD-10-CM | POA: Diagnosis not present

## 2022-03-05 DIAGNOSIS — E43 Unspecified severe protein-calorie malnutrition: Secondary | ICD-10-CM | POA: Diagnosis not present

## 2022-03-05 DIAGNOSIS — Z7189 Other specified counseling: Secondary | ICD-10-CM | POA: Diagnosis not present

## 2022-03-06 DIAGNOSIS — I5033 Acute on chronic diastolic (congestive) heart failure: Secondary | ICD-10-CM | POA: Diagnosis not present

## 2022-03-06 DIAGNOSIS — E43 Unspecified severe protein-calorie malnutrition: Secondary | ICD-10-CM | POA: Diagnosis not present

## 2022-03-06 DIAGNOSIS — Z7189 Other specified counseling: Secondary | ICD-10-CM | POA: Diagnosis not present

## 2022-03-06 DIAGNOSIS — E872 Acidosis, unspecified: Secondary | ICD-10-CM | POA: Diagnosis not present

## 2022-03-06 DIAGNOSIS — E039 Hypothyroidism, unspecified: Secondary | ICD-10-CM | POA: Diagnosis not present

## 2022-03-09 ENCOUNTER — Telehealth: Payer: Self-pay | Admitting: Cardiovascular Disease

## 2022-03-09 DIAGNOSIS — Z79899 Other long term (current) drug therapy: Secondary | ICD-10-CM

## 2022-03-09 DIAGNOSIS — I350 Nonrheumatic aortic (valve) stenosis: Secondary | ICD-10-CM

## 2022-03-09 MED ORDER — FUROSEMIDE 80 MG PO TABS: 80.0000 mg | ORAL_TABLET | Freq: Two times a day (BID) | ORAL | 3 refills | Status: DC

## 2022-03-09 NOTE — Telephone Encounter (Signed)
[name], Joann A, MD  You; Marlana Salvage, Shamea N, RN12 minutes ago (12:58 PM)   [place] BNP 16,000.  Has lower BP on Coreg in the setting of severe AS. Would double lasix does and repeat BMP in 1 week.  Patient has Severe AS not [name] candidate for TAVR/SAVR.  Limited medical therapy available.   Called patient's son back with Dr. Gasper Sells advisement. Patient will start taking lasix 80 mg BID for [name] week and get lab work done in [place] by the end of next week.

## 2022-03-09 NOTE — Telephone Encounter (Signed)
Pt son called in with pt next to him, She would like to switch from Dr. Johnsie Cancel to Dr. Gasper Sells, is this okay with you all?

## 2022-03-09 NOTE — Telephone Encounter (Signed)
Pt c/o BP issue: STAT if pt c/o blurred vision, one-sided weakness or slurred speech  1. What are your last 5 BP readings?   2/18: 116/69  2/19: 116/72 2/20: 121/63 2/21: 120/84  2. Are you having any other symptoms (ex. Dizziness, headache, blurred vision, passed out)? No   3. What is your BP issue? Pt son told to call back to report bp readings

## 2022-03-16 DIAGNOSIS — I110 Hypertensive heart disease with heart failure: Secondary | ICD-10-CM | POA: Diagnosis not present

## 2022-03-16 DIAGNOSIS — R69 Illness, unspecified: Secondary | ICD-10-CM | POA: Diagnosis not present

## 2022-03-16 DIAGNOSIS — I5032 Chronic diastolic (congestive) heart failure: Secondary | ICD-10-CM | POA: Diagnosis not present

## 2022-03-16 DIAGNOSIS — G301 Alzheimer's disease with late onset: Secondary | ICD-10-CM | POA: Diagnosis not present

## 2022-03-23 ENCOUNTER — Ambulatory Visit: Payer: Medicare HMO | Attending: Internal Medicine

## 2022-03-23 DIAGNOSIS — I350 Nonrheumatic aortic (valve) stenosis: Secondary | ICD-10-CM | POA: Diagnosis not present

## 2022-03-23 DIAGNOSIS — Z79899 Other long term (current) drug therapy: Secondary | ICD-10-CM

## 2022-03-23 LAB — BASIC METABOLIC PANEL
BUN/Creatinine Ratio: 15 (ref 12–28)
BUN: 30 mg/dL — ABNORMAL HIGH (ref 8–27)
CO2: 28 mmol/L (ref 20–29)
Calcium: 10.1 mg/dL (ref 8.7–10.3)
Chloride: 99 mmol/L (ref 96–106)
Creatinine, Ser: 2 mg/dL — ABNORMAL HIGH (ref 0.57–1.00)
Glucose: 97 mg/dL (ref 70–99)
Potassium: 4 mmol/L (ref 3.5–5.2)
Sodium: 139 mmol/L (ref 134–144)
eGFR: 24 mL/min/{1.73_m2} — ABNORMAL LOW (ref >59)

## 2022-03-23 NOTE — Addendum Note (Signed)
Addended by: Aris Georgia, Taleen Prosser L on: 03/23/2022 08:53 AM   Modules accepted: Orders

## 2022-04-03 DIAGNOSIS — E872 Acidosis, unspecified: Secondary | ICD-10-CM | POA: Diagnosis not present

## 2022-04-03 DIAGNOSIS — I5033 Acute on chronic diastolic (congestive) heart failure: Secondary | ICD-10-CM | POA: Diagnosis not present

## 2022-04-03 DIAGNOSIS — E43 Unspecified severe protein-calorie malnutrition: Secondary | ICD-10-CM | POA: Diagnosis not present

## 2022-04-03 DIAGNOSIS — E039 Hypothyroidism, unspecified: Secondary | ICD-10-CM | POA: Diagnosis not present

## 2022-04-03 DIAGNOSIS — Z7189 Other specified counseling: Secondary | ICD-10-CM | POA: Diagnosis not present

## 2022-04-04 DIAGNOSIS — E872 Acidosis, unspecified: Secondary | ICD-10-CM | POA: Diagnosis not present

## 2022-04-04 DIAGNOSIS — E43 Unspecified severe protein-calorie malnutrition: Secondary | ICD-10-CM | POA: Diagnosis not present

## 2022-04-04 DIAGNOSIS — I5033 Acute on chronic diastolic (congestive) heart failure: Secondary | ICD-10-CM | POA: Diagnosis not present

## 2022-04-04 DIAGNOSIS — E039 Hypothyroidism, unspecified: Secondary | ICD-10-CM | POA: Diagnosis not present

## 2022-04-04 DIAGNOSIS — Z7189 Other specified counseling: Secondary | ICD-10-CM | POA: Diagnosis not present

## 2022-05-26 NOTE — Progress Notes (Deleted)
Office Visit    Patient Name: [name] Date of Encounter: 05/26/2022  PCP:  Merlene Laughter, MD   [facility]  Cardiologist:  Charlton Haws, MD  Advanced Practice Provider:  No care team member to display Electrophysiologist:  None   HPI    [name] is a 89 y.o. female with a past medical history of CAD status post CABG, SOB with severe AS (not a candidate for TAVR), lower extremity edema last seen February 2024 presents today for follow-up visit.  The patient noted that she was doing well at her last appointment.  Son noted that a week ago she was having some spells of weakness, decreased mentation, and shortness of breath.  She told them to call an ambulance.  They were able to get her blood pressure at home.  She felt better by this time.  She was able to walk to the.  Had benign workup.  Persistent left leg swelling from a prior fracture.  Left swelling was worse as she has had an increase in leg swelling.  Has some baseline CKD.  No chest pain or pressure.  No palpitations or syncope.  Today, she ***  Past Medical History    Past Medical History:  Diagnosis Date   Anemia    Aortic stenosis    mild AS by 02/18/11  Deboraha Sprang)   Arthritis    Bradycardia    Asymtomatic resolved   Cancer (HCC)    ceervical   Chronic renal disease, stage III (HCC)    Coronary artery disease    CABG 2003   Dyslipidemia    goal LSL less then 70 but statin intolerant   GERD (gastroesophageal reflux disease)    Hypertension    Hypothyroidism    Iron deficiency anemia 07/18/2010   Mild AI (aortic insufficiency)    Mild aortic stenosis    OP (osteoporosis) 10/18/2010   fosamax   Statin intolerance    Past Surgical History:  Procedure Laterality Date   ABDOMINAL HYSTERECTOMY     CARDIAC CATHETERIZATION     2004   cataracts     COLONOSCOPY  09/2010   normal EGD abd COLONOSCOPY   CORONARY ARTERY BYPASS GRAFT  2004   with LIMA to LAD, SVG to diagonal, SVG  to OM, SOVG to PDA normal LVF   HIP ARTHROPLASTY Left 03/23/2020   Procedure: ARTHROPLASTY BIPOLAR HIP (HEMIARTHROPLASTY) anterior approach;  Surgeon: Samson Frederic, MD;  Location: [facility];  Service: Orthopedics;  Laterality: Left;   LUMBAR LAMINECTOMY  09/23/2011   Procedure: MICRODISCECTOMY LUMBAR LAMINECTOMY;  Surgeon: Eldred Manges, MD;  Location: [facility];  Service: Orthopedics;  Laterality: N/A;  L4-5 Decompression    Allergies  Allergies  Allergen Reactions   Aleve [Naproxen Sodium] Hives   Aleve [Naproxen]     Other reaction(s): Unknown   Crestor [Rosuvastatin]     Other reaction(s): lethargy   Excedrin Extra Strength [Aspirin-Acetaminophen-Caffeine] Nausea And Vomiting   Meloxicam     Other reaction(s): felt poorly   Statins Other (See Comments)    No energy, very weak   Vytorin [Ezetimibe-Simvastatin] Other (See Comments)    Fatigue      EKGs/Labs/Other Studies Reviewed:   The following studies were reviewed today: Cardiac Studies & Procedures       ECHOCARDIOGRAM  ECHOCARDIOGRAM COMPLETE 11/28/2019  Narrative ECHOCARDIOGRAM REPORT    Patient Name:   [name] Date of Exam: 11/28/2019 Medical Rec #:  [id number]  Height:       60.0 in Accession #:    [id number]       Weight:       90.0 lb Date of Birth:  03/28/1931        BSA:          1.329 m Patient Age:    88 years         BP:           138/74 mmHg Patient Gender: F                HR:           82 bpm. Exam Location:  [address]  Procedure: 2D Echo, 3D Echo, Cardiac Doppler, Color Doppler and Strain Analysis  Indications:    I35 Aortic stenosis.  History:        Patient has prior history of Echocardiogram examinations, most recent 04/30/2019. CAD, Aortic Valve Disease; Risk Factors:Hypertension and Dyslipidemia.  Sonographer:    Garald Braver, RDCS Referring Phys: [id number] Wendall Stade  IMPRESSIONS   1. The aortic valve is abnormal. There is severe calcifcation of the aortic valve.  Aortic valve regurgitation is mild. Severe aortic valve stenosis. Aortic valve area, by VTI measures 0.40 cm. Aortic valve mean gradient measures 53.0 mmHg. Aortic valve Vmax measures 4.16 m/s. 2. Left ventricular ejection fraction, by estimation, is 55 to 60%. The left ventricle has normal function. The left ventricle has no regional wall motion abnormalities. Left ventricular diastolic parameters are consistent with Grade II diastolic dysfunction (pseudonormalization). Elevated left ventricular end-diastolic pressure. The average left ventricular global longitudinal strain is -20.1 %. The global longitudinal strain is normal. 3. Right ventricular systolic function is normal. The right ventricular size is normal. There is mildly elevated pulmonary artery systolic pressure. The estimated right ventricular systolic pressure is 38.7 mmHg. 4. Left atrial size was mildly dilated. 5. Right atrial size was mildly dilated. 6. The mitral valve is grossly normal. Mild to moderate mitral valve regurgitation. No evidence of mitral stenosis. 7. The inferior vena cava is dilated in size with >50% respiratory variability, suggesting right atrial pressure of 8 mmHg.  Comparison(s): A prior study was performed on 04/30/19. Prior images reviewed side by side. Increase in systolic mean gradient through aortic valve, no other significant changes.  FINDINGS Left Ventricle: Left ventricular ejection fraction, by estimation, is 55 to 60%. The left ventricle has normal function. The left ventricle has no regional wall motion abnormalities. The average left ventricular global longitudinal strain is -20.1 %. The global longitudinal strain is normal. The left ventricular internal cavity size was normal in size. There is no left ventricular hypertrophy. Left ventricular diastolic parameters are consistent with Grade II diastolic dysfunction (pseudonormalization). Elevated left ventricular end-diastolic pressure.  Right  Ventricle: The right ventricular size is normal. No increase in right ventricular wall thickness. Right ventricular systolic function is normal. There is mildly elevated pulmonary artery systolic pressure. The tricuspid regurgitant velocity is 2.77 m/s, and with an assumed right atrial pressure of 8 mmHg, the estimated right ventricular systolic pressure is 38.7 mmHg.  Left Atrium: Left atrial size was mildly dilated.  Right Atrium: Right atrial size was mildly dilated.  Pericardium: There is no evidence of pericardial effusion.  Mitral Valve: The mitral valve is grossly normal. Mild mitral annular calcification. Mild to moderate mitral valve regurgitation. No evidence of mitral valve stenosis.  Tricuspid Valve: The tricuspid valve is normal in structure. Tricuspid valve regurgitation is mild .  No evidence of tricuspid stenosis.  Aortic Valve: The aortic valve is abnormal. There is severe calcifcation of the aortic valve. Aortic valve regurgitation is mild. Aortic regurgitation PHT measures 547 msec. Severe aortic stenosis is present. Aortic valve mean gradient measures 53.0 mmHg. Aortic valve peak gradient measures 69.3 mmHg. Aortic valve area, by VTI measures 0.40 cm.  Pulmonic Valve: The pulmonic valve was normal in structure. Pulmonic valve regurgitation is trivial. No evidence of pulmonic stenosis.  Aorta: The aortic root and ascending aorta are structurally normal, with no evidence of dilitation.  Venous: The inferior vena cava is dilated in size with greater than 50% respiratory variability, suggesting right atrial pressure of 8 mmHg.  IAS/Shunts: No atrial level shunt detected by color flow Doppler.   LEFT VENTRICLE PLAX 2D LVIDd:         4.60 cm  Diastology LVIDs:         3.70 cm  LV e' medial:    4.73 cm/s LV PW:         0.70 cm  LV E/e' medial:  24.9 LV IVS:        0.60 cm  LV e' lateral:   3.26 cm/s LVOT diam:     2.00 cm  LV E/e' lateral: 36.2 LV SV:         43 LV SV  Index:   33       2D Longitudinal Strain LVOT Area:     3.14 cm 2D Strain GLS (A2C):   -20.2 % 2D Strain GLS (A3C):   -19.5 % 2D Strain GLS (A4C):   -20.6 % 2D Strain GLS Avg:     -20.1 %  3D Volume EF: 3D EF:        47 % LV EDV:       135 ml LV ESV:       72 ml LV SV:        63 ml  RIGHT VENTRICLE RV Basal diam:  3.40 cm RV S prime:     7.60 cm/s TAPSE (M-mode): 2.4 cm RVSP:           38.7 mmHg  LEFT ATRIUM             Index       RIGHT ATRIUM           Index LA diam:        4.10 cm 3.08 cm/m  RA Pressure: 8.00 mmHg LA Vol (A2C):   52.3 ml 39.34 ml/m RA Area:     15.20 cm LA Vol (A4C):   40.3 ml 30.31 ml/m RA Volume:   38.70 ml  29.11 ml/m LA Biplane Vol: 50.9 ml 38.29 ml/m AORTIC VALVE AV Area (Vmax):    0.51 cm AV Area (Vmean):   0.46 cm AV Area (VTI):     0.40 cm AV Vmax:           416.33 cm/s AV Vmean:          301.200 cm/s AV VTI:            1.081 m AV Peak Grad:      69.3 mmHg AV Mean Grad:      53.0 mmHg LVOT Vmax:         68.02 cm/s LVOT Vmean:        44.080 cm/s LVOT VTI:          0.138 m LVOT/AV VTI ratio: 0.13 AI PHT:  547 msec  AORTA Ao Root diam: 3.10 cm Ao Asc diam:  3.30 cm  MITRAL VALVE                TRICUSPID VALVE TR Peak grad:   30.7 mmHg TR Vmax:        277.00 cm/s MV E velocity: 118.00 cm/s  Estimated RAP:  8.00 mmHg MV A velocity: 50.70 cm/s   RVSP:           38.7 mmHg MV E/A ratio:  2.33 SHUNTS Systemic VTI:  0.14 m Systemic Diam: 2.00 cm  Weston Brass MD Electronically signed by Weston Brass MD Signature Date/Time: 11/28/2019/9:34:17 PM    Final              EKG:  EKG is *** ordered today.  The ekg ordered today demonstrates ***  Recent Labs: 02/15/2022: B Natriuretic Peptide 938.3; Hemoglobin 11.9; Platelets 231 03/02/2022: ALT 9; NT-Pro BNP 16,078 03/23/2022: BUN 30; Creatinine, Ser 2.00; Potassium 4.0; Sodium 139  Recent Lipid Panel    Component Value Date/Time   CHOL 208 (H) 03/24/2014 1008    TRIG 129.0 03/24/2014 1008   HDL 44.80 03/24/2014 1008   CHOLHDL 5 03/24/2014 1008   VLDL 25.8 03/24/2014 1008   LDLCALC 137 (H) 03/24/2014 1008    Risk Assessment/Calculations:  {Does this patient have ATRIAL FIBRILLATION?:[phone]}  Home Medications   No outpatient medications have been marked as taking for the 06/02/22 encounter (Appointment) with Sharlene Dory, PA-C.     Review of Systems   ***   All other systems reviewed and are otherwise negative except as noted above.  Physical Exam    VS:  There were no vitals taken for this visit. , BMI There is no height or weight on file to calculate BMI.  Wt Readings from Last 3 Encounters:  03/02/22 104 lb 9.6 oz (47.4 kg)  11/09/21 95 lb (43.1 kg)  09/15/21 99 lb 9.6 oz (45.2 kg)     GEN: Well nourished, well developed, in no acute distress. HEENT: normal. Neck: Supple, no JVD, carotid bruits, or masses. Cardiac: ***RRR, no murmurs, rubs, or gallops. No clubbing, cyanosis, edema.  ***Radials/PT 2+ and equal bilaterally.  Respiratory:  ***Respirations regular and unlabored, clear to auscultation bilaterally. GI: Soft, nontender, nondistended. MS: No deformity or atrophy. Skin: Warm and dry, no rash. Neuro:  Strength and sensation are intact. Psych: Normal affect.  Assessment & Plan    Severe aortic stenosis Coronary artery disease Hx CABG Stage III chronic kidney disease Dementia  No BP recorded.  {Refresh Note OR Click here to enter BP  :1}***      Disposition: Follow up {follow up:15908} with Charlton Haws, MD or APP.  Signed, Sharlene Dory, PA-C 05/26/2022, 4:42 PM [facility]

## 2022-06-01 ENCOUNTER — Other Ambulatory Visit: Payer: Self-pay | Admitting: Internal Medicine

## 2022-06-01 ENCOUNTER — Telehealth: Payer: Self-pay | Admitting: Internal Medicine

## 2022-06-01 NOTE — Telephone Encounter (Signed)
Son stated patient is now in hospice care and wants advice on if patient should still keep appointment tomorrow or have virtual visit.

## 2022-06-01 NOTE — Telephone Encounter (Signed)
Spoke with MD who advised pt does not need to keep f/u visit for tomorrow with [place], [place].    Called son who reports pt was placed in Hospice care on 04/20/22.  Episodes or symptoms that warranted initial visit with MD have decreased.   Advised of MD recommendation.  Advised if has further needs to call back.  Son appreciative of call all questions answered. Will cancel OV for 06/02/22.

## 2022-06-02 ENCOUNTER — Ambulatory Visit: Payer: Medicare HMO | Admitting: Physician Assistant

## 2022-06-02 DIAGNOSIS — Z951 Presence of aortocoronary bypass graft: Secondary | ICD-10-CM

## 2022-06-02 DIAGNOSIS — Z79899 Other long term (current) drug therapy: Secondary | ICD-10-CM

## 2022-06-02 DIAGNOSIS — F0390 Unspecified dementia without behavioral disturbance: Secondary | ICD-10-CM

## 2022-06-02 DIAGNOSIS — I350 Nonrheumatic aortic (valve) stenosis: Secondary | ICD-10-CM

## 2022-06-02 DIAGNOSIS — I2510 Atherosclerotic heart disease of native coronary artery without angina pectoris: Secondary | ICD-10-CM

## 2022-06-02 DIAGNOSIS — E785 Hyperlipidemia, unspecified: Secondary | ICD-10-CM

## 2022-06-02 DIAGNOSIS — N183 Chronic kidney disease, stage 3 unspecified: Secondary | ICD-10-CM

## 2022-08-13 ENCOUNTER — Observation Stay (HOSPITAL_COMMUNITY)
Admission: EM | Admit: 2022-08-13 | Discharge: 2022-08-15 | Disposition: A | Discharge status: Home / Self Care | Payer: Medicare HMO | Attending: Family Medicine | Admitting: Family Medicine

## 2022-08-13 ENCOUNTER — Encounter (HOSPITAL_COMMUNITY): Payer: Self-pay

## 2022-08-13 ENCOUNTER — Emergency Department (HOSPITAL_COMMUNITY): Payer: Medicare HMO

## 2022-08-13 ENCOUNTER — Other Ambulatory Visit: Payer: Self-pay

## 2022-08-13 DIAGNOSIS — J9601 Acute respiratory failure with hypoxia: Principal | ICD-10-CM | POA: Diagnosis present

## 2022-08-13 DIAGNOSIS — N1832 Chronic kidney disease, stage 3b: Secondary | ICD-10-CM | POA: Insufficient documentation

## 2022-08-13 DIAGNOSIS — Z7982 Long term (current) use of aspirin: Secondary | ICD-10-CM | POA: Diagnosis not present

## 2022-08-13 DIAGNOSIS — N179 Acute kidney failure, unspecified: Secondary | ICD-10-CM

## 2022-08-13 DIAGNOSIS — I10 Essential (primary) hypertension: Secondary | ICD-10-CM | POA: Diagnosis present

## 2022-08-13 DIAGNOSIS — I130 Hypertensive heart and chronic kidney disease with heart failure and stage 1 through stage 4 chronic kidney disease, or unspecified chronic kidney disease: Secondary | ICD-10-CM | POA: Insufficient documentation

## 2022-08-13 DIAGNOSIS — R7989 Other specified abnormal findings of blood chemistry: Secondary | ICD-10-CM | POA: Diagnosis not present

## 2022-08-13 DIAGNOSIS — R0902 Hypoxemia: Secondary | ICD-10-CM

## 2022-08-13 DIAGNOSIS — Z8541 Personal history of malignant neoplasm of cervix uteri: Secondary | ICD-10-CM | POA: Insufficient documentation

## 2022-08-13 DIAGNOSIS — Z79899 Other long term (current) drug therapy: Secondary | ICD-10-CM | POA: Diagnosis not present

## 2022-08-13 DIAGNOSIS — R0602 Shortness of breath: Secondary | ICD-10-CM | POA: Diagnosis present

## 2022-08-13 DIAGNOSIS — Z951 Presence of aortocoronary bypass graft: Secondary | ICD-10-CM | POA: Diagnosis not present

## 2022-08-13 DIAGNOSIS — I350 Nonrheumatic aortic (valve) stenosis: Secondary | ICD-10-CM

## 2022-08-13 DIAGNOSIS — E039 Hypothyroidism, unspecified: Secondary | ICD-10-CM | POA: Diagnosis present

## 2022-08-13 DIAGNOSIS — Z96642 Presence of left artificial hip joint: Secondary | ICD-10-CM | POA: Insufficient documentation

## 2022-08-13 DIAGNOSIS — F0390 Unspecified dementia without behavioral disturbance: Secondary | ICD-10-CM | POA: Insufficient documentation

## 2022-08-13 DIAGNOSIS — I509 Heart failure, unspecified: Principal | ICD-10-CM | POA: Insufficient documentation

## 2022-08-13 DIAGNOSIS — R0600 Dyspnea, unspecified: Secondary | ICD-10-CM

## 2022-08-13 DIAGNOSIS — I2510 Atherosclerotic heart disease of native coronary artery without angina pectoris: Secondary | ICD-10-CM | POA: Insufficient documentation

## 2022-08-13 DIAGNOSIS — N189 Chronic kidney disease, unspecified: Secondary | ICD-10-CM

## 2022-08-13 DIAGNOSIS — I2489 Other forms of acute ischemic heart disease: Secondary | ICD-10-CM

## 2022-08-13 LAB — TROPONIN I (HIGH SENSITIVITY)
Troponin I (High Sensitivity): 142 ng/L (ref <18)
Troponin I (High Sensitivity): 157 ng/L (ref <18)

## 2022-08-13 LAB — CBC WITH DIFFERENTIAL/PLATELET
Abs Immature Granulocytes: 0.01 10*3/uL (ref 0.00–0.07)
Basophils Absolute: 0 10*3/uL (ref 0.0–0.1)
Basophils Relative: 0 %
Eosinophils Absolute: 0 10*3/uL (ref 0.0–0.5)
Eosinophils Relative: 1 %
HCT: 34.3 % — ABNORMAL LOW (ref 36.0–46.0)
Hemoglobin: 11.2 g/dL — ABNORMAL LOW (ref 12.0–15.0)
Immature Granulocytes: 0 %
Lymphocytes Relative: 23 %
Lymphs Abs: 1.2 10*3/uL (ref 0.7–4.0)
MCH: 30.2 pg (ref 26.0–34.0)
MCHC: 32.7 g/dL (ref 30.0–36.0)
MCV: 92.5 fL (ref 80.0–100.0)
Monocytes Absolute: 0.8 10*3/uL (ref 0.1–1.0)
Monocytes Relative: 15 %
Neutro Abs: 3.3 10*3/uL (ref 1.7–7.7)
Neutrophils Relative %: 61 %
Platelets: 158 10*3/uL (ref 150–400)
RBC: 3.71 MIL/uL — ABNORMAL LOW (ref 3.87–5.11)
RDW: 12.8 % (ref 11.5–15.5)
WBC: 5.3 10*3/uL (ref 4.0–10.5)
nRBC: 0 % (ref 0.0–0.2)

## 2022-08-13 LAB — BASIC METABOLIC PANEL
Anion gap: 15 (ref 5–15)
BUN: 45 mg/dL — ABNORMAL HIGH (ref 8–23)
CO2: 25 mmol/L (ref 22–32)
Calcium: 9.2 mg/dL (ref 8.9–10.3)
Chloride: 98 mmol/L (ref 98–111)
Creatinine, Ser: 2.48 mg/dL — ABNORMAL HIGH (ref 0.44–1.00)
GFR, Estimated: 18 mL/min — ABNORMAL LOW (ref >60)
Glucose, Bld: 91 mg/dL (ref 70–99)
Potassium: 3.4 mmol/L — ABNORMAL LOW (ref 3.5–5.1)
Sodium: 138 mmol/L (ref 135–145)

## 2022-08-13 LAB — BRAIN NATRIURETIC PEPTIDE: B Natriuretic Peptide: 305.9 pg/mL — ABNORMAL HIGH (ref 0.0–100.0)

## 2022-08-13 NOTE — ED Provider Notes (Signed)
[place] EMERGENCY DEPARTMENT AT [facility] Provider Note   CSN: [id number] Arrival date & time: 08/13/22  1755     History  Chief Complaint  Patient presents with   Shortness of Breath    [name] is a 89 y.o. female.  HPI    89 year old female comes in with chief complaint of shortness of breath.  Patient has history of CAD, severe aortic stenosis, CHF. Patient has dementia and history is limited.  She states that she was feeling significantly short of breath and having some chest discomfort prior to ED arrival.  She has been having shortness of breath for the last few days.  Shortness of breath is exertional, she is unable to complete ADLs. Per son, the last 2 days she has been appearing more short of breath and today she was quite labored. No hx of PE.  Home Medications Prior to Admission medications   Medication Sig Start Date End Date Taking? Authorizing Provider  acetaminophen (TYLENOL) 500 MG tablet Take 1,000 mg by mouth every 6 (six) hours as needed for mild pain.    [provider]  aspirin 81 MG chewable tablet Chew 81 mg by mouth daily.    [provider]  Cholecalciferol (VITAMIN D-3) 25 MCG (1000 UT) CAPS Take 1,000 Units by mouth daily. Patient not taking: Reported on 03/02/2022    [provider]  ferrous sulfate 325 (65 FE) MG tablet Take 325 mg by mouth at bedtime.    [provider]  furosemide (LASIX) 80 MG tablet TAKE 1 TABLET BY MOUTH 2 TIMES DAILY. 06/01/22   Chandrasekhar, Mahesh A, MD  levothyroxine (SYNTHROID, LEVOTHROID) 75 MCG tablet Take 75 mcg by mouth at bedtime.    [provider]  Multiple Vitamin (MULTIVITAMIN WITH MINERALS) TABS tablet Take 1 tablet by mouth daily.    [provider]  vitamin B-12 (CYANOCOBALAMIN) 250 MCG tablet Take 250 mcg by mouth daily.    [provider]      Allergies    Aleve [naproxen sodium], Aleve [naproxen], Crestor [rosuvastatin],  Excedrin extra strength [aspirin-acetaminophen-caffeine], Meloxicam, Statins, and Vytorin [ezetimibe-simvastatin]    Review of Systems   Review of Systems  All other systems reviewed and are negative.   Physical Exam Updated Vital Signs BP 120/68 (BP Location: Left Arm)   Pulse (!) 58   Temp 97.8 F (36.6 C) (Oral)   Resp 15   Ht 5' (1.524 m)   Wt 47.4 kg   SpO2 100%   BMI 20.41 kg/m  Physical Exam Vitals and nursing note reviewed.  Constitutional:      Appearance: She is well-developed.  HENT:     Head: Atraumatic.  Cardiovascular:     Rate and Rhythm: Normal rate.  Pulmonary:     Effort: Pulmonary effort is normal.     Breath sounds: Examination of the right-lower field reveals rales. Examination of the left-lower field reveals rales. Rales present. No decreased breath sounds, wheezing or rhonchi.  Musculoskeletal:     Cervical back: Normal range of motion and neck supple.     Right lower leg: Tenderness present. No edema.     Left lower leg: Tenderness present. No edema.  Skin:    General: Skin is warm and dry.  Neurological:     Mental Status: She is alert and oriented to person, place, and time.     ED Results / Procedures / Treatments   Labs (all labs ordered are listed, but only  abnormal results are displayed) Labs Reviewed  CBC WITH DIFFERENTIAL/PLATELET - Abnormal; Notable for the following components:      Result Value   RBC 3.71 (*)    Hemoglobin 11.2 (*)    HCT 34.3 (*)    All other components within normal limits  BASIC METABOLIC PANEL - Abnormal; Notable for the following components:   Potassium 3.4 (*)    BUN 45 (*)    Creatinine, Ser 2.48 (*)    GFR, Estimated 18 (*)    All other components within normal limits  TROPONIN I (HIGH SENSITIVITY) - Abnormal; Notable for the following components:   Troponin I (High Sensitivity) 142 (*)    All other components within normal limits  BRAIN NATRIURETIC PEPTIDE  TROPONIN I (HIGH SENSITIVITY)     EKG EKG Interpretation Date/Time:  Saturday August 13 2022 17:57:52 EDT Ventricular Rate:  70 PR Interval:  161 QRS Duration:  91 QT Interval:  407 QTC Calculation: 440 R Axis:   22  Text Interpretation: Sinus rhythm LVH with secondary repolarization abnormality Anterior Q waves, possibly due to LVH TWI in the lateral leads Nonspecific ST and T wave abnormality Confirmed by Derwood Kaplan [phone]) on 08/13/2022 8:11:19 PM  Radiology DG Chest Port 1 View  Result Date: 08/13/2022 CLINICAL DATA:  Shortness of breath EXAM: PORTABLE CHEST 1 VIEW COMPARISON:  X-ray 02/15/2022 FINDINGS: Sternal wires. Calcified aorta. Normal cardiopericardial silhouette. Hyperinflation. No consolidation, pneumothorax or effusion. No edema. The right inferior costophrenic angle is clipped off the edge of the film. Overlapping cardiac leads. IMPRESSION: Postop chest.  Hyperinflation. Electronically Signed   By: Karen Kays M.D.   On: 08/13/2022 20:56    Procedures .Critical Care  Performed by: Derwood Kaplan, MD Authorized by: Derwood Kaplan, MD   Critical care provider statement:    Critical care time (minutes):  33   Critical care was time spent personally by me on the following activities:  Development of treatment plan with patient or surrogate, discussions with consultants, evaluation of patient's response to treatment, examination of patient, ordering and review of laboratory studies, ordering and review of radiographic studies, ordering and performing treatments and interventions, pulse oximetry, re-evaluation of patient's condition and review of old charts     Medications Ordered in ED Medications - No data to display  ED Course/ Medical Decision Making/ A&P                             Medical Decision Making Amount and/or Complexity of Data Reviewed Labs: ordered. Radiology: ordered.   Pt comes in with cc of shob. Patient has known history of severe aortic stenosis, in fact she was  enrolled in hospice and was discharged about a month ago.  It seems that patient has been slowly getting worse again over the last few days and today was quite labored.  She was also hypoxic initially, with 3 L of oxygen and is feeling comfortable.  Patient has history of CAD, severe aortic stenosis, CHF.  Clinically appears that this is worsening of her aortic stenosis.  I will get basic labs.  I called patient's son, provided meaningful history.  I also reviewed cardiology notes.  It appears that the son is comfortable with reenrollment in hospice.  On Saturday night, unlikely for patient to get enrolled in hospice.  I think patient will need admission to the hospital.  Cardiology might need to be consulted.  I already put in palliative  consult.  Patient's care has been signed out to incoming team given her workup is pending at this time.  She appears compensated right now.   Final Clinical Impression(s) / ED Diagnoses Final diagnoses:  Acute congestive heart failure, unspecified heart failure type (HCC)  Dyspnea, unspecified type  Hypoxia  Aortic stenosis, severe  Demand ischemia    Rx / DC Orders ED Discharge Orders          Ordered    Brain natriuretic peptide  Status:  Canceled        08/13/22 1945              Derwood Kaplan, MD 08/13/22 2119

## 2022-08-13 NOTE — ED Provider Notes (Signed)
Received handoff from Dr. Rhunette Croft. Hx of severe aortic stenosis. Was in hospice, just discontinued off hospice. Now getting SOB on exertion and at rest now. Now on 3L O2 which is new for patient. Needs cardiology inpatient and hospice/palliative care.   Son is comfortable with hospice.   Physical Exam  BP 120/68 (BP Location: Left Arm)   Pulse (!) 58   Temp 97.8 F (36.6 C) (Oral)   Resp 15   Ht 5' (1.524 m)   Wt 47.4 kg   SpO2 100%   BMI 20.41 kg/m   Physical Exam Vitals and nursing note reviewed.  Constitutional:      General: She is not in acute distress.    Comments: Chronically ill appearing  HENT:     Head: Normocephalic and atraumatic.  Eyes:     Conjunctiva/sclera: Conjunctivae normal.  Cardiovascular:     Rate and Rhythm: Normal rate and regular rhythm.     Heart sounds: No murmur heard. Pulmonary:     Effort: Pulmonary effort is normal. No respiratory distress.     Breath sounds: Normal breath sounds.     Comments: +3L O2 Abdominal:     Palpations: Abdomen is soft.     Tenderness: There is no abdominal tenderness.  Musculoskeletal:        General: No swelling.     Cervical back: Neck supple.  Skin:    General: Skin is warm and dry.     Capillary Refill: Capillary refill takes less than 2 seconds.  Neurological:     Mental Status: She is alert.  Psychiatric:        Mood and Affect: Mood normal.     Procedures  Procedures  ED Course / MDM    Medical Decision Making Amount and/or Complexity of Data Reviewed Labs: ordered. Radiology: ordered.  Risk Decision regarding hospitalization.   Discussed with Dr. Rhunette Croft, he believes the troponin is elevated given the increased cardiac demand from the aortic stenosis, and work of breathing.  Recommends consult with cardiology, and admission to hospitalist.  Spoke with Dr. Paulino Rily cardiology, he states given the patient is hospice, there is not much she can do for the patient.  Agrees with hospice, if that is  her goals of care.  Discussed with Dr. Cyndia Bent, as she accepts admission.  Patient will be admitted, for further palliative/hospice care, consultation.  She will need to be [place], with oxygen, for home, and resources as needed.       Pete Pelt, [place] 08/13/22 2212    Sloan Leiter, DO 08/14/22 [phone]

## 2022-08-13 NOTE — ED Triage Notes (Signed)
PT BIB [facility] with a report of SOB. Pt is alert and oriented x4 but a poor historian when it comes to her medical history. 10mg  of morphine was give PO at 1644. Lung sounds are clear, O2 stat is 99-100 on 3l, Pt is not normally on O2

## 2022-08-14 ENCOUNTER — Observation Stay (HOSPITAL_COMMUNITY): Payer: Medicare HMO

## 2022-08-14 DIAGNOSIS — E039 Hypothyroidism, unspecified: Secondary | ICD-10-CM

## 2022-08-14 DIAGNOSIS — R7989 Other specified abnormal findings of blood chemistry: Secondary | ICD-10-CM

## 2022-08-14 DIAGNOSIS — J9601 Acute respiratory failure with hypoxia: Secondary | ICD-10-CM | POA: Diagnosis not present

## 2022-08-14 DIAGNOSIS — R609 Edema, unspecified: Secondary | ICD-10-CM | POA: Diagnosis not present

## 2022-08-14 DIAGNOSIS — I350 Nonrheumatic aortic (valve) stenosis: Secondary | ICD-10-CM

## 2022-08-14 DIAGNOSIS — N179 Acute kidney failure, unspecified: Secondary | ICD-10-CM | POA: Diagnosis not present

## 2022-08-14 DIAGNOSIS — I509 Heart failure, unspecified: Secondary | ICD-10-CM | POA: Diagnosis not present

## 2022-08-14 DIAGNOSIS — I10 Essential (primary) hypertension: Secondary | ICD-10-CM

## 2022-08-14 DIAGNOSIS — N189 Chronic kidney disease, unspecified: Secondary | ICD-10-CM

## 2022-08-14 LAB — BASIC METABOLIC PANEL
Anion gap: 19 — ABNORMAL HIGH (ref 5–15)
BUN: 46 mg/dL — ABNORMAL HIGH (ref 8–23)
CO2: 23 mmol/L (ref 22–32)
Calcium: 9.7 mg/dL (ref 8.9–10.3)
Chloride: 97 mmol/L — ABNORMAL LOW (ref 98–111)
Creatinine, Ser: 2.41 mg/dL — ABNORMAL HIGH (ref 0.44–1.00)
GFR, Estimated: 19 mL/min — ABNORMAL LOW (ref >60)
Glucose, Bld: 73 mg/dL (ref 70–99)
Potassium: 3.7 mmol/L (ref 3.5–5.1)
Sodium: 139 mmol/L (ref 135–145)

## 2022-08-14 LAB — CBC
HCT: 38.4 % (ref 36.0–46.0)
Hemoglobin: 12.9 g/dL (ref 12.0–15.0)
MCH: 31.5 pg (ref 26.0–34.0)
MCHC: 33.6 g/dL (ref 30.0–36.0)
MCV: 93.9 fL (ref 80.0–100.0)
Platelets: 169 10*3/uL (ref 150–400)
RBC: 4.09 MIL/uL (ref 3.87–5.11)
RDW: 12.9 % (ref 11.5–15.5)
WBC: 5.9 10*3/uL (ref 4.0–10.5)
nRBC: 0 % (ref 0.0–0.2)

## 2022-08-14 MED ORDER — LEVOTHYROXINE SODIUM 75 MCG PO TABS
75.0000 ug | ORAL_TABLET | Freq: Every day | ORAL | Status: DC
  Administered 2022-08-14 – 2022-08-15 (×2): 75 ug via ORAL
  Filled 2022-08-14 (×2): qty 1

## 2022-08-14 MED ORDER — ENOXAPARIN SODIUM 30 MG/0.3ML IJ SOSY
30.0000 mg | PREFILLED_SYRINGE | Freq: Every day | INTRAMUSCULAR | Status: DC
  Administered 2022-08-15: 30 mg via SUBCUTANEOUS
  Filled 2022-08-14: qty 0.3

## 2022-08-14 MED ORDER — SODIUM CHLORIDE 0.9 % IV SOLN: INTRAVENOUS | Status: DC

## 2022-08-14 NOTE — Care Management (Addendum)
Transition of Care [facility]) - Inpatient Brief Assessment   Patient Details  Name: KERIANNE STOKE MRN: [id number] Date of Birth: September 07, 1931  Transition of Care [facility]) CM/SW Contact:    Lockie Pares, RN Phone Number: 08/14/2022, 9:22 AM   Clinical Narrative: Patient was on hospice for a year with [facility] for severe Aortic Stenosis. Was just discharged in June. Called Dallas Breeding at [facility] who agrees to restart hospice if patient meets criteria. Team notified, Odelia Gage will call the son. Can possibly come for evaluation this afternoon.  TOC will follow for updates. 1450 Patient will be staying the night, probable DC in AM, Cherie from [facility] hospice will accept for tomorrow.   Transition of Care Asessment: Insurance and Status: Insurance coverage has been reviewed Patient has primary care physician: Yes Home environment has been reviewed: was on hospice, DC after a year  in June Prior level of function:: same as current Prior/Current Home Services: No current home services Social Determinants of Health Reivew: SDOH reviewed no interventions necessary Readmission risk has been reviewed: Yes Transition of care needs: transition of care needs identified, TOC will continue to follow

## 2022-08-14 NOTE — H&P (Signed)
History and Physical    Patient: [name] ZOX:[id number] DOB: 17-May-1931 DOA: 08/13/2022 DOS: the patient was seen and examined on 08/14/2022 PCP: [facility]  Patient coming from: Home  Chief Complaint:  Chief Complaint  Patient presents with   Shortness of Breath   HPI: [name] is a 89 y.o. female with medical history significant of Dementia, CAD s/p CABG, severe aortic stenosis not candidate for TAVR, hypertension, CAD, CHF, CKD 3B who presents with increasing shortness of breath.  She reports increasing shortness of breath for the past week worse with exertion.  No cough or fever.  No chest pain.  No nausea, vomiting or diarrhea.  Normally has low appetite and drinks little water at baseline.  No lower extremity edema.  She was on hospice for end-stage aortic stenosis but came off hospice as she exceeded the life expectancy.  Son would like her to be reenrolled in hospice.  Patient's goal is to get better and to go home.  In the ED, she was afebrile normotensive and initially required 3 L via nasal cannula but was able to wean down back to room air..  No leukocytosis, mild anemia with hemoglobin 11.2.  Sodium 135, mild hypokalemia 3.4.  Has worsening creatinine of 2.48 with a prior of 1.85. BNP of 305. troponin elevated to 142 and 157.  EKG on my review in sinus rhythm with nonspecific inferior T wave inversion.  Chest x-ray does not show any pulmonary edema or infection.  ED physician felt symptoms likely attributed to worsening of her severe aortic stenosis.  Family would like to reenrollment back into hospice.  Palliative care consult has been placed.  Cardiology also consulted but no further recommendation as family wants to pursue palliative care. Hospitalist consulted for admission. Review of Systems: As mentioned in the history of present illness. All other systems reviewed and are negative. Past Medical History:  Diagnosis Date    Anemia    Aortic stenosis    mild AS by 02/18/11  [facility])   Arthritis    Bradycardia    Asymtomatic resolved   Cancer (HCC)    ceervical   Chronic renal disease, stage III (HCC)    Coronary artery disease    CABG 2003   Dyslipidemia    goal LSL less then 70 but statin intolerant   GERD (gastroesophageal reflux disease)    Hypertension    Hypothyroidism    Iron deficiency anemia 07/18/2010   Mild AI (aortic insufficiency)    Mild aortic stenosis    OP (osteoporosis) 10/18/2010   fosamax   Statin intolerance    Past Surgical History:  Procedure Laterality Date   ABDOMINAL HYSTERECTOMY     CARDIAC CATHETERIZATION     2004   cataracts     COLONOSCOPY  09/2010   normal EGD abd COLONOSCOPY   CORONARY ARTERY BYPASS GRAFT  2004   with LIMA to LAD, SVG to diagonal, SVG to OM, SOVG to PDA normal LVF   HIP ARTHROPLASTY Left 03/23/2020   Procedure: ARTHROPLASTY BIPOLAR HIP (HEMIARTHROPLASTY) anterior approach;  Surgeon: Samson Frederic, MD;  Location: [facility];  Service: Orthopedics;  Laterality: Left;   LUMBAR LAMINECTOMY  09/23/2011   Procedure: MICRODISCECTOMY LUMBAR LAMINECTOMY;  Surgeon: Eldred Manges, MD;  Location: [facility] OR;  Service: Orthopedics;  Laterality: N/A;  L4-5 Decompression   Social History:  reports that she has never smoked. She has never used smokeless tobacco. She reports that she does not drink  alcohol and does not use drugs.  Allergies  Allergen Reactions   Aleve [Naproxen Sodium] Hives   Aleve [Naproxen]     Other reaction(s): Unknown   Crestor [Rosuvastatin]     Other reaction(s): lethargy   Excedrin Extra Strength [Aspirin-Acetaminophen-Caffeine] Nausea And Vomiting   Meloxicam     Other reaction(s): felt poorly   Statins Other (See Comments)    No energy, very weak   Vytorin [Ezetimibe-Simvastatin] Other (See Comments)    Fatigue     Family History  Problem Relation Age of Onset   Heart Problems Mother    Asthma Father     Prior to Admission  medications   Medication Sig Start Date End Date Taking? Authorizing Provider  acetaminophen (TYLENOL) 500 MG tablet Take 1,000 mg by mouth every 6 (six) hours as needed for mild pain.    [provider]  aspirin 81 MG chewable tablet Chew 81 mg by mouth daily.    [provider]  Cholecalciferol (VITAMIN D-3) 25 MCG (1000 UT) CAPS Take 1,000 Units by mouth daily. Patient not taking: Reported on 03/02/2022    [provider]  ferrous sulfate 325 (65 FE) MG tablet Take 325 mg by mouth at bedtime.    [provider]  furosemide (LASIX) 80 MG tablet TAKE 1 TABLET BY MOUTH 2 TIMES DAILY. 06/01/22   Chandrasekhar, Mahesh A, MD  levothyroxine (SYNTHROID, LEVOTHROID) 75 MCG tablet Take 75 mcg by mouth at bedtime.    [provider]  Multiple Vitamin (MULTIVITAMIN WITH MINERALS) TABS tablet Take 1 tablet by mouth daily.    [provider]  vitamin B-12 (CYANOCOBALAMIN) 250 MCG tablet Take 250 mcg by mouth daily.    [provider]    Physical Exam: Vitals:   08/13/22 2100 08/13/22 2304 08/14/22 0011 08/14/22 0135  BP: 120/68  98/64 122/65  Pulse: (!) 58  69 61  Resp: 15  16 (!) 22  Temp:  97.6 F (36.4 C)  97.7 F (36.5 C)  TempSrc:  Oral  Oral  SpO2: 100%  100% 100%  Weight:      Height:       Constitutional: NAD, calm, comfortable, chronically ill-appearing thin frail elderly female lying upright in bed asleep.  Awoke easily to voice. Eyes: lids and conjunctivae normal ENMT: Mucous membranes are moist.  Neck: normal, supple Respiratory: clear to auscultation bilaterally, no wheezing, no crackles. Normal respiratory effort. No accessory muscle use.  Able to speak in full sentences. Cardiovascular: Regular rate and rhythm, 3 out of 6 systolic flow murmur heard best to left sternal border.  +2 pitting edema bilateral lower extremity.   Abdomen: no tenderness Musculoskeletal: no clubbing / cyanosis. No joint deformity upper and  lower extremities.  Muscle wasting in all extremities.   Skin: no rashes, lesions, ulcers. No induration Neurologic: CN 2-12 grossly intact  Psychiatric: Normal judgment and insight. Alert and oriented x 3. Normal mood. Data Reviewed:  See HPI  Assessment and Plan: * Acute respiratory failure with hypoxia (HCC) - Patient was required to 3 L but has been able to wean down to room air - She has no leukocytosis, fever or findings of infection on chest x-ray.  Also no pulmonary edema.  She does have acute on chronic CKD and mildly elevated troponin level.  Symptoms likely due to dehydration exacerbating symptoms of her severe end-stage aortic stenosis. -Will give gentle IV fluid hydration and follow symptoms clinically -Son who is her caretaker would like her  to be reenrolled back into hospice.  Palliative care consult placed.  Elevated troponin - Mildly elevated troponin of 142 and 157. No symptoms or findings of ischemic changes on EKG. Suspect demand ischemia from her aortic stenosis in the setting of dehydration.    Acute kidney injury superimposed on chronic kidney disease (HCC) - Creatinine elevated 2.48 from a prior 1.85 - Will give gentle IV fluid hydration - Avoid nephrotoxic agent - Follow creatinine trend in the morning  Hypothyroidism - Continue levothyroxine  Severe aortic stenosis - End-stage aortic stenosis not a candidate for TAVR  Essential hypertension, benign - Patient does not have her medication list at this time.  She does have Lasix listed on epic and will hold given her AKI      Advance Care Planning:   Code Status: DNR   Consults: palliative care  Family Communication: none at bedside  Severity of Illness: The appropriate patient status for this patient is OBSERVATION. Observation status is judged to be reasonable and necessary in order to provide the required intensity of service to ensure the patient's safety. The patient's presenting symptoms,  physical exam findings, and initial radiographic and laboratory data in the context of their medical condition is felt to place them at decreased risk for further clinical deterioration. Furthermore, it is anticipated that the patient will be medically stable for discharge from the hospital within 2 midnights of admission.   Author: Anselm Jungling, DO 08/14/2022 1:42 AM  For on call review [email or web address].

## 2022-08-14 NOTE — Assessment & Plan Note (Signed)
Continue levothyroxine 

## 2022-08-14 NOTE — Progress Notes (Addendum)
PROGRESS NOTE    [name]  ZOX:[id number] DOB: 01-Apr-1931 DOA: 08/13/2022 PCP: Thana Ates, MD  Chief Complaint  Patient presents with   Shortness of Breath    Brief Narrative:   [name] is [name] 89 y.o. female with medical history significant of Dementia, CAD s/p CABG, severe aortic stenosis not candidate for TAVR, hypertension, CAD, CHF, CKD 3B who presents with increasing shortness of breath.   Assessment & Plan:   Principal Problem:   Acute respiratory failure with hypoxia (HCC) Active Problems:   Essential hypertension, benign   Severe aortic stenosis   Hypothyroidism   Acute kidney injury superimposed on chronic kidney disease (HCC)   Elevated troponin   Acute respiratory failure with hypoxia (HCC) - recently RA, but now charted on 3 L -- CXR with hyperinflation -- unclear cause, though has improved with supportive care, IVF -- wean o2  -- if remains improved tomorrow, will discharge back home with hospice   LLE swelling After discussion with son, he notes chronic with hx surgery RLE [place] ordered erroneously (I meant for LLE to be ordered), but with additional hx from son, will defer additional imaging  Severe aortic stenosis - End-stage aortic stenosis not Scharlene Catalina candidate for TAVR  Elevated troponin - Mildly elevated troponin of 142 and 157. No symptoms or findings of ischemic changes on EKG. Suspect demand ischemia from her aortic stenosis in the setting of dehydration.     Acute kidney injury superimposed on chronic kidney disease (HCC) - Creatinine elevated 2.48 from Almon Whitford prior 1.85 - received IVF, will follow repeat   Hypothyroidism - Continue levothyroxine   Essential hypertension, benign - Patient does not have her medication list at this time.  She does have Lasix listed on epic and will hold given her AKI    DVT prophylaxis: lovenox Code Status: DNR Family Communication: son Disposition:   Status is: Observation The patient remains OBS  appropriate and will d/c before 2 midnights.   Consultants:  none  Procedures:  LE [place] Summary:  RIGHT:  - There is no evidence of deep vein thrombosis in the lower extremity.    - No cystic structure found in the popliteal fossa.    LEFT:  - No evidence of common femoral vein obstruction.   Antimicrobials:  Anti-infectives (From admission, onward)    None       Subjective: Feels better  Objective: Vitals:   08/14/22 1150 08/14/22 1200 08/14/22 1545 08/14/22 1627  BP:  (!) 129/52  130/68  Pulse:  61 (!) 56 60  Resp:  (!) 21 16 17   Temp: 98 F (36.7 C)   98 F (36.7 C)  TempSrc: Oral   Oral  SpO2:  95% 94% 96%  Weight:      Height:       No intake or output data in the 24 hours ending 08/14/22 1823 Filed Weights   08/13/22 1824  Weight: 47.4 kg    Examination:  General exam: Appears calm and comfortable  Respiratory system:unlabored Cardiovascular system: RRR Gastrointestinal system: Abdomen is nondistended, soft and nontender.  Central nervous system: Alert and oriented. No focal neurological deficits. Extremities: LLE edema   Data Reviewed: I have personally reviewed following labs and imaging studies  CBC: Recent Labs  Lab 08/13/22 2011 08/14/22 0211  WBC 5.3 5.9  NEUTROABS 3.3  --   HGB 11.2* 12.9  HCT 34.3* 38.4  MCV 92.5 93.9  PLT 158 169    Basic Metabolic  Panel: Recent Labs  Lab 08/13/22 2011 08/14/22 0211  NA 138 139  K 3.4* 3.7  CL 98 97*  CO2 25 23  GLUCOSE 91 73  BUN 45* 46*  CREATININE 2.48* 2.41*  CALCIUM 9.2 9.7    GFR: Estimated Creatinine Clearance: 11.4 mL/min ([place]) (by C-G formula based on SCr of 2.41 mg/dL (H)).  Liver Function Tests: No results for input(s): "AST", "ALT", "ALKPHOS", "BILITOT", "PROT", "ALBUMIN" in the last 168 hours.  CBG: No results for input(s): "GLUCAP" in the last 168 hours.   No results found for this or any previous visit (from the past 240 hour(s)).       Radiology  Studies: VAS [place] LOWER EXTREMITY VENOUS (DVT)  Result Date: 08/14/2022  Lower Venous DVT Study Patient Name:  [name] [facility]  Date of Exam:   08/14/2022 Medical Rec #: [id number]         Accession #:    [id number] Date of Birth: 1931-12-10         Patient Gender: F Patient Age:   42 years Exam Location:  [facility] Procedure:      VAS [place] LOWER EXTREMITY VENOUS (DVT) Referring Phys: Dynasti Kerman POWELL JR --------------------------------------------------------------------------------  Indications: Edema.  Comparison Study: Prior negative bilateral LEV done 03/25/2020 Performing Technologist: [name] RVS  Examination Guidelines: Delphina Schum complete evaluation includes B-mode imaging, spectral Doppler, color Doppler, and power Doppler as needed of all accessible portions of each vessel. Bilateral testing is considered an integral part of Aidyn Kellis complete examination. Limited examinations for reoccurring indications may be performed as noted. The reflux portion of the exam is performed with the patient in reverse Trendelenburg.  +---------+---------------+---------+-----------+----------+--------------+ RIGHT    CompressibilityPhasicitySpontaneityPropertiesThrombus Aging +---------+---------------+---------+-----------+----------+--------------+ CFV      Full           Yes      Yes                                 +---------+---------------+---------+-----------+----------+--------------+ SFJ      Full                                                        +---------+---------------+---------+-----------+----------+--------------+ FV Prox  Full                                                        +---------+---------------+---------+-----------+----------+--------------+ FV Mid   Full                                                        +---------+---------------+---------+-----------+----------+--------------+ FV DistalFull                                                         +---------+---------------+---------+-----------+----------+--------------+ PFV  Full                                                        +---------+---------------+---------+-----------+----------+--------------+ POP      Full           Yes      Yes                                 +---------+---------------+---------+-----------+----------+--------------+ PTV      Full                                                        +---------+---------------+---------+-----------+----------+--------------+ PERO     Full                                                        +---------+---------------+---------+-----------+----------+--------------+   +----+---------------+---------+-----------+----------+--------------+ LEFTCompressibilityPhasicitySpontaneityPropertiesThrombus Aging +----+---------------+---------+-----------+----------+--------------+ CFV Full           Yes      Yes                                 +----+---------------+---------+-----------+----------+--------------+     Summary: RIGHT: - There is no evidence of deep vein thrombosis in the lower extremity.  - No cystic structure found in the popliteal fossa.  LEFT: - No evidence of common femoral vein obstruction.  *See table(s) above for measurements and observations. Electronically signed by Waverly Ferrari MD on 08/14/2022 at 5:01:25 PM.    Final    DG Chest Port 1 View  Result Date: 08/13/2022 CLINICAL DATA:  Shortness of breath EXAM: PORTABLE CHEST 1 VIEW COMPARISON:  X-ray 02/15/2022 FINDINGS: Sternal wires. Calcified aorta. Normal cardiopericardial silhouette. Hyperinflation. No consolidation, pneumothorax or effusion. No edema. The right inferior costophrenic angle is clipped off the edge of the film. Overlapping cardiac leads. IMPRESSION: Postop chest.  Hyperinflation. Electronically Signed   By: Karen Kays M.D.   On: 08/13/2022 20:56        Scheduled Meds:  enoxaparin (LOVENOX)  injection  30 mg Subcutaneous Daily   levothyroxine  75 mcg Oral Q0600   Continuous Infusions:   LOS: 0 days    Time spent: over 30 min    Lacretia Nicks, MD [facility]   To contact the attending provider between 7A-7P or the covering provider during after hours 7P-7A, please log into the web site [email or web address] and access using universal [place] password for that web site. If you do not have the password, please call the hospital operator.  08/14/2022, 6:23 PM

## 2022-08-14 NOTE — Assessment & Plan Note (Signed)
-   Mildly elevated troponin of 142 and 157. No symptoms or findings of ischemic changes on EKG. Suspect demand ischemia from her aortic stenosis in the setting of dehydration.

## 2022-08-14 NOTE — Progress Notes (Signed)
   Referral received for hospice care at home. Pt was previously enrolled with  hospice care for and discharged due to extended prognosis. With the new complications and decline I have discussed with our MD at hospice and she does feel like th ept would benefit from hospice services at home. We are able to re-enroll at d/c. I have spoken to the pt' son Fayrene Fearing and he is in agreement. After discussion with MD the plan will be for pt to d/c home tomorrow if remains stable and no complications. Norm Parcel RN [phone]

## 2022-08-14 NOTE — Assessment & Plan Note (Signed)
-   Patient does not have her medication list at this time.  She does have Lasix listed on epic and will hold given her AKI

## 2022-08-14 NOTE — Assessment & Plan Note (Signed)
-   End-stage aortic stenosis not a candidate for TAVR

## 2022-08-14 NOTE — Assessment & Plan Note (Signed)
-   Patient was required to 3 L but has been able to wean down to room air - She has no leukocytosis, fever or findings of infection on chest x-ray.  Also no pulmonary edema.  She does have acute on chronic CKD and mildly elevated troponin level.  Symptoms likely due to dehydration exacerbating symptoms of her severe end-stage aortic stenosis. -Will give gentle IV fluid hydration and follow symptoms clinically -Son who is her caretaker would like her to be reenrolled back into hospice.  Palliative care consult placed.

## 2022-08-14 NOTE — Consult Note (Signed)
Palliative Medicine Inpatient Consult Note  Reason for consult: Hospice enrollment  HPI:  Per intake H&P - 7/28 by Dr. Gretel Acre Tu->   "[name] is a 89 y.o. female with medical history significant of Dementia, CAD s/p CABG, severe aortic stenosis not candidate for TAVR, hypertension, CAD, CHF, CKD 3B who presents with increasing shortness of breath.   She reports increasing shortness of breath for the past week worse with exertion.  No cough or fever.  No chest pain.  No nausea, vomiting or diarrhea.  Normally has low appetite and drinks little water at baseline.  No lower extremity edema.  She was on hospice for end-stage aortic stenosis but came off hospice as she exceeded the life expectancy.  Son would like her to be reenrolled in hospice.  Patient's goal is to get better and to go home.   In the ED, she was afebrile normotensive and initially required 3 L via nasal cannula but was able to wean down back to room air..   No leukocytosis, mild anemia with hemoglobin 11.2.  Sodium 135, mild hypokalemia 3.4.  Has worsening creatinine of 2.48 with a prior of 1.85. BNP of 305. troponin elevated to 142 and 157.   EKG on my review in sinus rhythm with nonspecific inferior T wave inversion.   Chest x-ray does not show any pulmonary edema or infection.   ED physician felt symptoms likely attributed to worsening of her severe aortic stenosis.  Family would like to reenrollment back into hospice.  Palliative care consult has been placed.  Cardiology also consulted but no further recommendation as family wants to pursue palliative care. Hospitalist consulted for admission."  Clinical Assessment/Goals of Care: I have reviewed medical records including EPIC notes, labs and imaging, received report from bedside RN, assessed the patient.    I met with [name] in her room in the ED and Called son, [name], to further discuss diagnosis prognosis, GOC, EOL wishes, disposition and  options.   I introduced Palliative Medicine as specialized medical care for people living with serious illness. It focuses on providing relief from the symptoms and stress of a serious illness. The goal is to improve quality of life for both the patient and the family. [name] states he is very familiar with palliative medicine and his mother was on Hospice until a few weeks ago. The weekly visits were helping manage her symptoms. She had been on oxygen at home but is not currently. Her symptoms of SOB worsened 3 days ago to the point that she could not talk well and asked him to call an ambulance last night. She does not sleep in a bed. She prefers to sleep in the recliner.  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 Patient has no living siblings. She has three sons. Her middle son [name] died 2 years ago. Sons [name] (POA and oldest son) and [name] (youngest son) live nearby. She lives with [name] [facility]) in his house. At home she has a cane and walker. She has been able to get to the bathroom and does not have bedside commode. He son prepares her meals. She has been able to wash up on her home prior to this event. Family had 3 losses, 2 aunts and the brother around the same time 2 years ago.   A detailed discussion was had today regarding advanced directives.  Concepts specific to code status, artifical feeding and hydration, continued IV antibiotics and rehospitalization was  had.  The difference between a aggressive medical intervention path  and a palliative comfort care path for this patient at this time was had. Values and goals of care important to patient and family were attempted to be elicited.  Patient and son reaffirmed DNAR/DNI status. Both the patient and her son, who is her POA, desire for her to have hospice at home and avoid hospitalization if possible.   Discussed the importance of continued conversation with family and their  medical providers regarding overall plan of  care and treatment options, ensuring decisions are within the context of the patients values and GOCs.  Decision Maker: Per advance directive and patient her son [name] is her MPOA.  SUMMARY OF RECOMMENDATIONS    Code Status/Advance Care Planning:  DNAR/DNI   Symptom Management:  Shortness of breath: Oxygen as needed via nasal cannnula     Psycho-social/Spiritual:  Desire for further Chaplaincy support: no Additional Recommendations: Psychosocial support for patient and family.   Prognosis: Poor with CAD, previous CABG, Aortic stenosis, not candidate for TAVR, CHF, and Chronic kidney disease.  Discharge Planning:  Transitions of care consult for Hospice at home.  Review of Systems  Constitutional:  Positive for malaise/fatigue. Negative for chills, fever and weight loss.  HENT:  Negative for ear pain, hearing loss and sore throat.   Eyes:  Negative for blurred vision and discharge.  Respiratory:  Positive for shortness of breath. Negative for cough, hemoptysis, sputum production and wheezing.   Cardiovascular:  Negative for chest pain, orthopnea, leg swelling and PND.  Gastrointestinal: Negative.   Genitourinary: Negative.   Musculoskeletal: Negative.   Skin:  Negative for itching and rash.  Neurological: Negative.   [name]/Heme/Allergies: Negative.   Psychiatric/Behavioral: Negative.     Nonsmoker, no alcohol use, no illicit drug use or marijuana.      08/14/2022    7:00 AM 08/14/2022    6:30 AM 08/14/2022    2:30 AM  Vitals with BMI  Systolic 126 120   Diastolic 56 65   Pulse 56 58 95   BP 104/53, P 70, R 14  O2 sats 100% on 3 lpm oxygen while I was in the room.   Physical Exam Constitutional:      General: She is not in acute distress.    Appearance: She is ill-appearing. She is not toxic-appearing or diaphoretic.  HENT:     Head: Normocephalic and atraumatic.     Mouth/Throat:     Mouth: Mucous membranes are moist.     Pharynx: Oropharynx is clear.   Cardiovascular:     Rate and Rhythm: Rhythm irregular.     Heart sounds: Murmur heard.     Systolic murmur is present with a grade of 3/6.  Pulmonary:     Breath sounds: Normal breath sounds.  Chest:     Comments: Midline healed scar Abdominal:     General: Abdomen is flat. Bowel sounds are normal.     Palpations: Abdomen is soft.     Tenderness: There is no abdominal tenderness.  Genitourinary:    Comments: deferred Musculoskeletal:     Right lower leg: 1+ Edema present.     Left lower leg: 2+ Edema present.  Skin:    General: Skin is warm and dry.     Capillary Refill: Capillary refill takes less than 2 seconds.     Coloration: Skin is pale.  Neurological:     Mental Status: She is alert.     PPS: 50%  This conversation/these recommendations were discussed with patient primary care team, Dr. Lowell Guitar via secure chat.  Thank you for the opportunity to participate in the care of this patient and family.    Jerrye Bushy, NP [facility] Palliative Medicine Team Team Cell Phone: [phone] Please utilize secure chat with additional questions, if there is no response within 30 minutes please call the above phone number  Palliative Medicine Team providers are available by phone from 7am to 7pm daily and can be reached through the team cell phone.  Should this patient require assistance outside of these hours, please call the patient's attending physician.

## 2022-08-14 NOTE — Assessment & Plan Note (Signed)
-   Creatinine elevated 2.48 from a prior 1.85 - Will give gentle IV fluid hydration - Avoid nephrotoxic agent - Follow creatinine trend in the morning

## 2022-08-14 NOTE — Progress Notes (Signed)
VASCULAR LAB    Right lower extremity venous duplex has been performed.  See CV proc for preliminary results.   Helaina Stefano, RVT 08/14/2022, 10:45 AM

## 2022-08-14 NOTE — ED Notes (Signed)
ED TO INPATIENT HANDOFF REPORT  ED Nurse Name and Phone #:  Darnelle Catalan RN   S Name/Age/Gender [name] 89 y.o. female Room/Bed: 036C/036C  Code Status   Code Status: DNR  Home/SNF/Other Home Patient oriented to: self Is this baseline? Yes   Triage Complete: Triage complete  Chief Complaint Acute respiratory failure with hypoxia [facility]) [J96.01]  Triage Note PT BIB [facility] with a report of SOB. Pt is alert and oriented x4 but a poor historian when it comes to her medical history. 10mg  of morphine was give PO at 1644. Lung sounds are clear, O2 stat is 99-100 on 3l, Pt is not normally on O2   Allergies Allergies  Allergen Reactions   Aleve [Naproxen Sodium] Hives   Aleve [Naproxen]     Other reaction(s): Unknown   Crestor [Rosuvastatin]     Other reaction(s): lethargy   Excedrin Extra Strength [Aspirin-Acetaminophen-Caffeine] Nausea And Vomiting   Meloxicam     Other reaction(s): felt poorly   Statins Other (See Comments)    No energy, very weak   Vytorin [Ezetimibe-Simvastatin] Other (See Comments)    Fatigue     Level of Care/Admitting Diagnosis ED Disposition     ED Disposition  Admit   Condition  --   Comment  Hospital Area: [facility] [[id number]]  Level of Care: Telemetry Cardiac [103]  May place patient in observation at [facility] or [facility] if equivalent level of care is available:: No  Covid Evaluation: Asymptomatic - no recent exposure (last 10 days) testing not required  Diagnosis: Acute respiratory failure with hypoxia [facility]) [[id number]]  Admitting Physician: Anselm Jungling [[id number]]  Attending Physician: Anselm Jungling [[id number]]          B Medical/Surgery History Past Medical History:  Diagnosis Date   Anemia    Aortic stenosis    mild AS by 02/18/11  [facility])   Arthritis    Bradycardia    Asymtomatic resolved   Cancer (HCC)    ceervical   Chronic renal disease, stage III (HCC)    Coronary artery disease     CABG 2003   Dyslipidemia    goal LSL less then 70 but statin intolerant   GERD (gastroesophageal reflux disease)    Hypertension    Hypothyroidism    Iron deficiency anemia 07/18/2010   Mild AI (aortic insufficiency)    Mild aortic stenosis    OP (osteoporosis) 10/18/2010   fosamax   Statin intolerance    Past Surgical History:  Procedure Laterality Date   ABDOMINAL HYSTERECTOMY     CARDIAC CATHETERIZATION     2004   cataracts     COLONOSCOPY  09/2010   normal EGD abd COLONOSCOPY   CORONARY ARTERY BYPASS GRAFT  2004   with LIMA to LAD, SVG to diagonal, SVG to OM, SOVG to PDA normal LVF   HIP ARTHROPLASTY Left 03/23/2020   Procedure: ARTHROPLASTY BIPOLAR HIP (HEMIARTHROPLASTY) anterior approach;  Surgeon: Samson Frederic, MD;  Location: [facility] ORS;  Service: Orthopedics;  Laterality: Left;   LUMBAR LAMINECTOMY  09/23/2011   Procedure: MICRODISCECTOMY LUMBAR LAMINECTOMY;  Surgeon: Eldred Manges, MD;  Location: [facility] OR;  Service: Orthopedics;  Laterality: N/A;  L4-5 Decompression     A IV Location/Drains/Wounds Patient Lines/Drains/Airways Status     Active Line/Drains/Airways     Name Placement date Placement time Site Days   Peripheral IV 08/14/22 20 G Anterior;Left Forearm 08/14/22  0000  Forearm  less than 1  Intake/Output Last 24 hours No intake or output data in the 24 hours ending 08/14/22 2132  Labs/Imaging Results for orders placed or performed during the hospital encounter of 08/13/22 (from the past 48 hour(s))  CBC with Differential     Status: Abnormal   Collection Time: 08/13/22  8:11 PM  Result Value Ref Range   WBC 5.3 4.0 - 10.5 K/uL   RBC 3.71 (L) 3.87 - 5.11 MIL/uL   Hemoglobin 11.2 (L) 12.0 - 15.0 g/dL   HCT 19.1 (L) 47.8 - 29.5 %   MCV 92.5 80.0 - 100.0 fL   MCH 30.2 26.0 - 34.0 pg   MCHC 32.7 30.0 - 36.0 g/dL   RDW 62.1 30.8 - 65.7 %   Platelets 158 150 - 400 K/uL    Comment: REPEATED TO VERIFY   nRBC 0.0 0.0 - 0.2 %   Neutrophils  Relative % 61 %   Neutro Abs 3.3 1.7 - 7.7 K/uL   Lymphocytes Relative 23 %   Lymphs Abs 1.2 0.7 - 4.0 K/uL   Monocytes Relative 15 %   Monocytes Absolute 0.8 0.1 - 1.0 K/uL   Eosinophils Relative 1 %   Eosinophils Absolute 0.0 0.0 - 0.5 K/uL   Basophils Relative 0 %   Basophils Absolute 0.0 0.0 - 0.1 K/uL   Immature Granulocytes 0 %   Abs Immature Granulocytes 0.01 0.00 - 0.07 K/uL    Comment: Performed at [facility], [address], [place], [place] [address]  Basic metabolic panel     Status: Abnormal   Collection Time: 08/13/22  8:11 PM  Result Value Ref Range   Sodium 138 135 - 145 mmol/L   Potassium 3.4 (L) 3.5 - 5.1 mmol/L   Chloride 98 98 - 111 mmol/L   CO2 25 22 - 32 mmol/L   Glucose, Bld 91 70 - 99 mg/dL    Comment: Glucose reference range applies only to samples taken after fasting for at least 8 hours.   BUN 45 (H) 8 - 23 mg/dL   Creatinine, Ser 2.95 (H) 0.44 - 1.00 mg/dL   Calcium 9.2 8.9 - 28.4 mg/dL   GFR, Estimated 18 (L) >60 mL/min    Comment: (NOTE) Calculated using the CKD-EPI Creatinine Equation (2021)    Anion gap 15 5 - 15    Comment: Performed at [facility], [address] [place], [place] [address]  Brain natriuretic peptide     Status: Abnormal   Collection Time: 08/13/22  8:11 PM  Result Value Ref Range   B Natriuretic Peptide 305.9 (H) 0.0 - 100.0 pg/mL    Comment: Performed at [facility], [address], [place], [place] [address]  Troponin I (High Sensitivity)     Status: Abnormal   Collection Time: 08/13/22  8:11 PM  Result Value Ref Range   Troponin I (High Sensitivity) 142 (HH) <18 ng/L    Comment: CRITICAL RESULT CALLED TO, READ BACK BY AND VERIFIED WITH Wandra Mannan, RN. 2114 08/13/22. LPAIT (NOTE) Elevated high sensitivity troponin I (hsTnI) values and significant  changes across serial measurements may suggest ACS but many other  chronic and acute conditions are known to elevate hsTnI results.  Refer to the  "Links" section for chest pain algorithms and additional  guidance. Performed at [facility], [address] [name], [place] [address]   Troponin I (High Sensitivity)     Status: Abnormal   Collection Time: 08/13/22 10:23 PM  Result Value Ref Range   Troponin I (High Sensitivity) 157 (HH) <18 ng/L    Comment: CRITICAL VALUE NOTED. VALUE IS CONSISTENT WITH PREVIOUSLY REPORTED/CALLED VALUE (NOTE) Elevated high sensitivity troponin I (hsTnI) values and significant  changes across serial measurements may suggest ACS but many other  chronic and acute conditions are known to elevate hsTnI results.  Refer to the "Links" section for chest pain algorithms and additional  guidance. Performed at [facility], [address] [place], [place] [address]   CBC     Status: None   Collection Time: 08/14/22  2:11 AM  Result Value Ref Range   WBC 5.9 4.0 - 10.5 K/uL   RBC 4.09 3.87 - 5.11 MIL/uL   Hemoglobin 12.9 12.0 - 15.0 g/dL   HCT 78.2 95.6 - 21.3 %   MCV 93.9 80.0 - 100.0 fL   MCH 31.5 26.0 - 34.0 pg   MCHC 33.6 30.0 - 36.0 g/dL   RDW 08.6 57.8 - 46.9 %   Platelets 169 150 - 400 K/uL   nRBC 0.0 0.0 - 0.2 %    Comment: Performed at [facility], [address], [place], [place] [address]  Basic metabolic panel     Status: Abnormal   Collection Time: 08/14/22  2:11 AM  Result Value Ref Range   Sodium 139 135 - 145 mmol/L   Potassium 3.7 3.5 - 5.1 mmol/L   Chloride 97 (L) 98 - 111 mmol/L   CO2 23 22 - 32 mmol/L   Glucose, Bld 73 70 - 99 mg/dL    Comment: Glucose reference range applies only to samples taken after fasting for at least 8 hours.   BUN 46 (H) 8 - 23 mg/dL   Creatinine, Ser 8.41 (H) 0.44 - 1.00 mg/dL   Calcium 9.7 8.9 - 32.4 mg/dL   GFR, Estimated 19 (L) >60 mL/min    Comment: (NOTE) Calculated using the CKD-EPI Creatinine Equation (2021)    Anion gap 19 (H) 5 - 15    Comment: Performed at [facility], [address], [place], [place]  [address]   VAS [place] LOWER EXTREMITY VENOUS (DVT)  Result Date: 08/14/2022  Lower Venous DVT Study Patient Name:  [name] [facility]  Date of Exam:   08/14/2022 Medical Rec #: [id number]         Accession #:    [id number] Date of Birth: 1932/01/10         Patient Gender: F Patient Age:   31 years Exam Location:  [facility] Procedure:      VAS [place] LOWER EXTREMITY VENOUS (DVT) Referring Phys: A POWELL JR --------------------------------------------------------------------------------  Indications: Edema.  Comparison Study: Prior negative bilateral LEV done 03/25/2020 Performing Technologist: Sherren Kerns RVS  Examination Guidelines: A complete evaluation includes B-mode imaging, spectral Doppler, color Doppler, and power Doppler as needed of all accessible portions of each vessel. Bilateral testing is considered an integral part of a complete examination. Limited examinations for reoccurring indications may be performed as noted. The reflux portion of the exam is performed with the patient in reverse Trendelenburg.  +---------+---------------+---------+-----------+----------+--------------+ RIGHT    CompressibilityPhasicitySpontaneityPropertiesThrombus Aging +---------+---------------+---------+-----------+----------+--------------+ CFV      Full           Yes      Yes                                 +---------+---------------+---------+-----------+----------+--------------+  SFJ      Full                                                        +---------+---------------+---------+-----------+----------+--------------+ FV Prox  Full                                                        +---------+---------------+---------+-----------+----------+--------------+ FV Mid   Full                                                        +---------+---------------+---------+-----------+----------+--------------+ FV DistalFull                                                         +---------+---------------+---------+-----------+----------+--------------+ PFV      Full                                                        +---------+---------------+---------+-----------+----------+--------------+ POP      Full           Yes      Yes                                 +---------+---------------+---------+-----------+----------+--------------+ PTV      Full                                                        +---------+---------------+---------+-----------+----------+--------------+ PERO     Full                                                        +---------+---------------+---------+-----------+----------+--------------+   +----+---------------+---------+-----------+----------+--------------+ LEFTCompressibilityPhasicitySpontaneityPropertiesThrombus Aging +----+---------------+---------+-----------+----------+--------------+ CFV Full           Yes      Yes                                 +----+---------------+---------+-----------+----------+--------------+     Summary: RIGHT: - There is no evidence of deep vein thrombosis in the lower extremity.  - No cystic structure found in the popliteal fossa.  LEFT: - No evidence of common femoral vein obstruction.  *See table(s) above for measurements and observations. Electronically signed by Waverly Ferrari MD on  08/14/2022 at 5:01:25 PM.    Final    DG Chest Port 1 View  Result Date: 08/13/2022 CLINICAL DATA:  Shortness of breath EXAM: PORTABLE CHEST 1 VIEW COMPARISON:  X-ray 02/15/2022 FINDINGS: Sternal wires. Calcified aorta. Normal cardiopericardial silhouette. Hyperinflation. No consolidation, pneumothorax or effusion. No edema. The right inferior costophrenic angle is clipped off the edge of the film. Overlapping cardiac leads. IMPRESSION: Postop chest.  Hyperinflation. Electronically Signed   By: Karen Kays M.D.   On: 08/13/2022 20:56    Pending Labs Unresulted Labs (From admission,  onward)    None       Vitals/Pain Today's Vitals   08/14/22 1150 08/14/22 1200 08/14/22 1545 08/14/22 1627  BP:  (!) 129/52  130/68  Pulse:  61 (!) 56 60  Resp:  (!) 21 16 17   Temp: 98 F (36.7 C)   98 F (36.7 C)  TempSrc: Oral   Oral  SpO2:  95% 94% 96%  Weight:      Height:        Isolation Precautions No active isolations  Medications Medications  levothyroxine (SYNTHROID) tablet 75 mcg (75 mcg Oral Given 08/14/22 0604)  enoxaparin (LOVENOX) injection 30 mg (has no administration in time range)    Mobility walks with person assist     Focused Assessments Neuro Assessment Handoff:  Swallow screen pass? Yes  Cardiac Rhythm: Normal sinus rhythm       Neuro Assessment: Within Defined Limits Neuro Checks:      Has TPA been given? No If patient is a Neuro Trauma and patient is going to OR before floor call report to 4N Charge nurse: [phone] or [phone]   R Recommendations: See Admitting Provider Note  Report given to:   Additional Notes:

## 2022-08-15 DIAGNOSIS — J9601 Acute respiratory failure with hypoxia: Secondary | ICD-10-CM | POA: Diagnosis not present

## 2022-08-15 LAB — CBC
HCT: 32.8 % — ABNORMAL LOW (ref 36.0–46.0)
Hemoglobin: 10.9 g/dL — ABNORMAL LOW (ref 12.0–15.0)
MCH: 30.4 pg (ref 26.0–34.0)
MCHC: 33.2 g/dL (ref 30.0–36.0)
MCV: 91.6 fL (ref 80.0–100.0)
Platelets: 151 10*3/uL (ref 150–400)
RBC: 3.58 MIL/uL — ABNORMAL LOW (ref 3.87–5.11)
RDW: 12.7 % (ref 11.5–15.5)
WBC: 4.7 10*3/uL (ref 4.0–10.5)
nRBC: 0 % (ref 0.0–0.2)

## 2022-08-15 MED ORDER — FUROSEMIDE 80 MG PO TABS: 80.0000 mg | ORAL_TABLET | Freq: Every day | ORAL | Status: AC

## 2022-08-15 NOTE — Plan of Care (Signed)
  Problem: Clinical Measurements: Goal: Respiratory complications will improve Outcome: Progressing   

## 2022-08-15 NOTE — Care Management Obs Status (Signed)
MEDICARE OBSERVATION STATUS NOTIFICATION   Patient Details  Name: [name] MRN: [id number] Date of Birth: 1931-09-29   Medicare Observation Status Notification Given:  Yes    Leone Haven, RN 08/15/2022, 11:04 AM

## 2022-08-15 NOTE — Progress Notes (Signed)
Assisting with discharge, patient called son for transport home and he is on the way. PIV removed, patient assisted with dressing, CCMD made aware of discharge and monitor removed. Patient would like to wait for son to review discharge instructions with him.

## 2022-08-15 NOTE — Discharge Summary (Signed)
Physician Discharge Summary  JACIE VANWERT ZOX:[id number] DOB: [birth year] DOA: 08/13/2022  PCP: Thana Ates, MD  Admit date: 08/13/2022 Discharge date: 08/15/2022  Time spent: 40 minutes  Recommendations for Outpatient Follow-up:  Follow outpatient CBC/CMP  Follow with hospice outpatient Follow volume status with lasix 80 mg daily (lower daily dose) Follow with cardiology outpatient    Discharge Diagnoses:  Principal Problem:   Acute respiratory failure with hypoxia [facility]) Active Problems:   Essential hypertension, benign   Severe aortic stenosis   Hypothyroidism   Acute kidney injury superimposed on chronic kidney disease (HCC)   Elevated troponin   Discharge Condition: stable  Diet recommendation: heart healthy   Filed Weights   08/13/22 1824 08/14/22 2211 08/15/22 0428  Weight: 47.4 kg 46.1 kg 46 kg    History of present illness:   [name] is [name] 89 y.o. female with medical history significant of Dementia, CAD s/p CABG, severe aortic stenosis not candidate for TAVR, hypertension, CAD, CHF, CKD 3B who presents with increasing shortness of breath.   She was previously enrolled with hospice, graduated [facility] few weeks prior to admission.  Plan is to resume hospice at home at discharge.  Shortness of breath resolved with conservative measures.  Lasix decreased at discharge.   Hospital Course:  Assessment and Plan:  Acute respiratory failure with hypoxia (HCC) - improved, now on RA -- CXR with hyperinflation -- unclear cause, though has improved with supportive care, IVF --plan for discharge back home with hospice   LLE swelling After discussion with son, he notes chronic with hx surgery RLE [place] ordered erroneously (I meant for LLE to be ordered), but with additional hx from son, will defer additional imaging   Severe aortic stenosis - End-stage aortic stenosis not Jerrell Mangel candidate for TAVR   Elevated troponin - Mildly elevated troponin of 142 and 157. No symptoms  or findings of ischemic changes on EKG. Suspect demand ischemia from her aortic stenosis in the setting of dehydration.     Acute kidney injury superimposed on chronic kidney disease (HCC) - Creatinine elevated 2.48 from Tyara Dassow prior 1.85 -- improved today - resume lasix at lower dose at discharge   Hypothyroidism - Continue levothyroxine   Essential hypertension, benign - lasix to resume at lower dose       Procedures: none   Consultations: cardiology  Discharge Exam: Vitals:   08/15/22 0831 08/15/22 1023  BP: (!) 107/55 (!) 133/52  Pulse: 68 70  Resp: 20 17  Temp: 98.6 F (37 C) 97.7 F (36.5 C)  SpO2: 100% 98%   No complaints Eager to discharge Discussed plan with son  General: No acute distress. Cardiovascular: systolic murmur Lungs: Clear to auscultation bilaterally  Abdomen: Soft, nontender, nondistended  Neurological: Alert and oriented 3. Moves all extremities 4 with equal strength. Cranial nerves II through XII grossly intact. Extremities: No clubbing or cyanosis. No edema  Discharge Instructions   Discharge Instructions     (HEART FAILURE PATIENTS) Call MD:  Anytime you have any of the following symptoms: 1) 3 pound weight gain in 24 hours or 5 pounds in 1 week 2) shortness of breath, with or without Craig Ionescu dry hacking cough 3) swelling in the hands, feet or stomach 4) if you have to sleep on extra pillows at night in order to breathe.   Complete by: As directed    Call MD for:  difficulty breathing, headache or visual disturbances   Complete by: As directed    Call  MD for:  extreme fatigue   Complete by: As directed    Call MD for:  hives   Complete by: As directed    Call MD for:  persistant dizziness or light-headedness   Complete by: As directed    Call MD for:  persistant nausea and vomiting   Complete by: As directed    Call MD for:  redness, tenderness, or signs of infection (pain, swelling, redness, odor or green/yellow discharge around incision  site)   Complete by: As directed    Call MD for:  severe uncontrolled pain   Complete by: As directed    Call MD for:  temperature >100.4   Complete by: As directed    Diet - low sodium heart healthy   Complete by: As directed    Discharge instructions   Complete by: As directed    You were seen for shortness of breath that has improved.  We'll send you back home.  I'll decrease your lasix (furosemide) to daily dosing for now.  Weigh yourself daily, if your weight increases by 3 lbs in Areen Trautner day or 5 lbs in Raheem Kolbe week, take another dose of lasix in the afternoon (and call your primary provider for additional instructions).  Follow up with cardiology as planned.   You're being enrolled with hospice again.  Return for new, recurrent, or worsening symptoms.  Please ask your PCP to request records from this hospitalization so they know what was done and what the next steps will be.   Increase activity slowly   Complete by: As directed       Allergies as of 08/15/2022       Reactions   Aleve [naproxen Sodium] Hives   Aleve [naproxen]    Other reaction(s): Unknown   Crestor [rosuvastatin]    Other reaction(s): lethargy   Excedrin Extra Strength [aspirin-acetaminophen-caffeine] Nausea And Vomiting   Meloxicam    Other reaction(s): felt poorly   Statins Other (See Comments)   No energy, very weak   Vytorin [ezetimibe-simvastatin] Other (See Comments)   Fatigue        Medication List     TAKE these medications    acetaminophen 500 MG tablet Commonly known as: TYLENOL Take 1,000 mg by mouth every 6 (six) hours as needed for mild pain.   aspirin 81 MG chewable tablet Chew 81 mg by mouth daily.   ferrous sulfate 325 (65 FE) MG tablet Take 325 mg by mouth at bedtime.   furosemide 80 MG tablet Commonly known as: LASIX Take 1 tablet (80 mg total) by mouth daily. Check your weights daily, if your weight goes up by 3 lbs in Cattie Tineo day or 5 lbs in Alex Leahy week, take an extra dose in the  afternoon (and discuss next steps with your primary provider) What changed:  when to take this additional instructions   levothyroxine 75 MCG tablet Commonly known as: SYNTHROID Take 75 mcg by mouth at bedtime.   morphine CONCENTRATE 10 mg / 0.5 ml concentrated solution Take 0.25 mLs by mouth as needed for severe pain.   multivitamin with minerals Tabs tablet Take 1 tablet by mouth daily.   vitamin B-12 250 MCG tablet Commonly known as: CYANOCOBALAMIN Take 250 mcg by mouth daily.       Allergies  Allergen Reactions   Aleve [Naproxen Sodium] Hives   Aleve [Naproxen]     Other reaction(s): Unknown   Crestor [Rosuvastatin]     Other reaction(s): lethargy   Excedrin Extra Strength [  Aspirin-Acetaminophen-Caffeine] Nausea And Vomiting   Meloxicam     Other reaction(s): felt poorly   Statins Other (See Comments)    No energy, very weak   Vytorin [Ezetimibe-Simvastatin] Other (See Comments)    Fatigue       The results of significant diagnostics from this hospitalization (including imaging, microbiology, ancillary and laboratory) are listed below for reference.    Significant Diagnostic Studies: VAS [place] LOWER EXTREMITY VENOUS (DVT)  Result Date: 08/14/2022  Lower Venous DVT Study Patient Name:  EMA SINES [facility]  Date of Exam:   08/14/2022 Medical Rec #: [id number]         Accession #:    [id number] Date of Birth: 1931/05/20         Patient Gender: F Patient Age:   13 years Exam Location:  [facility] Procedure:      VAS [place] LOWER EXTREMITY VENOUS (DVT) Referring Phys: Troy Kanouse POWELL JR --------------------------------------------------------------------------------  Indications: Edema.  Comparison Study: Prior negative bilateral LEV done 03/25/2020 Performing Technologist: Sherren Kerns RVS  Examination Guidelines: Calisa Luckenbaugh complete evaluation includes B-mode imaging, spectral Doppler, color Doppler, and power Doppler as needed of all accessible portions of each vessel. Bilateral testing  is considered an integral part of Tanyiah Laurich complete examination. Limited examinations for reoccurring indications may be performed as noted. The reflux portion of the exam is performed with the patient in reverse Trendelenburg.  +---------+---------------+---------+-----------+----------+--------------+ RIGHT    CompressibilityPhasicitySpontaneityPropertiesThrombus Aging +---------+---------------+---------+-----------+----------+--------------+ CFV      Full           Yes      Yes                                 +---------+---------------+---------+-----------+----------+--------------+ SFJ      Full                                                        +---------+---------------+---------+-----------+----------+--------------+ FV Prox  Full                                                        +---------+---------------+---------+-----------+----------+--------------+ FV Mid   Full                                                        +---------+---------------+---------+-----------+----------+--------------+ FV DistalFull                                                        +---------+---------------+---------+-----------+----------+--------------+ PFV      Full                                                        +---------+---------------+---------+-----------+----------+--------------+  POP      Full           Yes      Yes                                 +---------+---------------+---------+-----------+----------+--------------+ PTV      Full                                                        +---------+---------------+---------+-----------+----------+--------------+ PERO     Full                                                        +---------+---------------+---------+-----------+----------+--------------+   +----+---------------+---------+-----------+----------+--------------+  LEFTCompressibilityPhasicitySpontaneityPropertiesThrombus Aging +----+---------------+---------+-----------+----------+--------------+ CFV Full           Yes      Yes                                 +----+---------------+---------+-----------+----------+--------------+     Summary: RIGHT: - There is no evidence of deep vein thrombosis in the lower extremity.  - No cystic structure found in the popliteal fossa.  LEFT: - No evidence of common femoral vein obstruction.  *See table(s) above for measurements and observations. Electronically signed by Waverly Ferrari MD on 08/14/2022 at 5:01:25 PM.    Final    DG Chest Port 1 View  Result Date: 08/13/2022 CLINICAL DATA:  Shortness of breath EXAM: PORTABLE CHEST 1 VIEW COMPARISON:  X-ray 02/15/2022 FINDINGS: Sternal wires. Calcified aorta. Normal cardiopericardial silhouette. Hyperinflation. No consolidation, pneumothorax or effusion. No edema. The right inferior costophrenic angle is clipped off the edge of the film. Overlapping cardiac leads. IMPRESSION: Postop chest.  Hyperinflation. Electronically Signed   By: Karen Kays M.D.   On: 08/13/2022 20:56    Microbiology: No results found for this or any previous visit (from the past 240 hour(s)).   Labs: Basic Metabolic Panel: Recent Labs  Lab 08/13/22 2011 08/14/22 0211 08/15/22 0133  NA 138 139 139  K 3.4* 3.7 3.6  CL 98 97* 104  CO2 25 23 25   GLUCOSE 91 73 96  BUN 45* 46* 46*  CREATININE 2.48* 2.41* 2.14*  CALCIUM 9.2 9.7 8.8*   Liver Function Tests: No results for input(s): "AST", "ALT", "ALKPHOS", "BILITOT", "PROT", "ALBUMIN" in the last 168 hours. No results for input(s): "LIPASE", "AMYLASE" in the last 168 hours. No results for input(s): "AMMONIA" in the last 168 hours. CBC: Recent Labs  Lab 08/13/22 2011 08/14/22 0211 08/15/22 0644  WBC 5.3 5.9 4.7  NEUTROABS 3.3  --   --   HGB 11.2* 12.9 10.9*  HCT 34.3* 38.4 32.8*  MCV 92.5 93.9 91.6  PLT 158 169 151    Cardiac Enzymes: No results for input(s): "CKTOTAL", "CKMB", "CKMBINDEX", "TROPONINI" in the last 168 hours. BNP: BNP (last 3 results) Recent Labs    02/15/22 0408 08/13/22 2011  BNP 938.3* 305.9*    ProBNP (last 3 results) Recent Labs    03/02/22 1026  PROBNP 16,078*    CBG: No results  for input(s): "GLUCAP" in the last 168 hours.     Signed:  Lacretia Nicks MD.  [facility] 08/15/2022, 11:12 AM

## 2022-08-15 NOTE — TOC Transition Note (Signed)
Transition of Care [facility]) - CM/SW Discharge Note   Patient Details  Name: [name] MRN: [id number] Date of Birth: 1931/08/11  Transition of Care [facility]) CM/SW Contact:  Leone Haven, RN Phone Number: 08/15/2022, 11:20 AM   Clinical Narrative:    Plan for dc today,  son with transport her home.  Cheri notified of dc today.    Final next level of care: Home w Hospice Care Barriers to Discharge: No Barriers Identified   Patient Goals and CMS Choice      Discharge Placement                         Discharge Plan and Services Additional resources added to the After Visit Summary for   In-house Referral: Hospice / Palliative Care Discharge Planning Services: CM Consult Post Acute Care Choice: Hospice          DME Arranged: N/A DME Agency: NA       HH Arranged: NA          Social Determinants of Health (SDOH) Interventions SDOH Screenings   Tobacco Use: Low Risk  (08/13/2022)     Readmission Risk Interventions     No data to display

## 2022-08-15 NOTE — TOC Initial Note (Addendum)
Transition of Care [facility]) - Initial/Assessment Note    Patient Details  Name: [name] MRN: [id number] Date of Birth: 1931-09-09  [facility] CM/SW Contact:    Leone Haven, RN Phone Number: 08/15/2022, 11:12 AM  Clinical Narrative:                 From home alone, this NCM spoke with patient, she gave this NCM permission to speak with Lavena Bullion, her son.  He states she does pretty well at home , she has walker, rollator and a cane.  She was active with [facility] with [facility] and now she will be home hospice with them.  Son states he has a cousin who lives down the street for patient and he has another brother.  Son states he will be transporting her home at dc. This NCM confirmed with Cheri regarding home with them for hospice.  Cheri states this patient was active with them under Hospice before also. She states if patient will  let someone come in they could also do an aide for them.  Expected Discharge Plan: Home w Hospice Care Barriers to Discharge: No Barriers Identified   Patient Goals and CMS Choice Patient states their goals for this hospitalization and ongoing recovery are:: return home with Hospice          Expected Discharge Plan and Services In-house Referral: Hospice / Palliative Care Discharge Planning Services: CM Consult Post Acute Care Choice: Hospice Living arrangements for the past 2 months: Single Family Home Expected Discharge Date: 08/15/22               DME Arranged: N/A DME Agency: NA       HH Arranged: NA          Prior Living Arrangements/Services Living arrangements for the past 2 months: Single Family Home Lives with:: Self Patient language and need for interpreter reviewed:: Yes Do you feel safe going back to the place where you live?: Yes      Need for Family Participation in Patient Care: Yes (Comment) Care giver support system in place?: Yes (comment) Current home  services: DME (cane, walker, rollator) Criminal Activity/Legal Involvement Pertinent to Current Situation/Hospitalization: No - Comment as needed  Activities of Daily Living Home Assistive Devices/Equipment: Cane (specify quad or straight), Shower chair with back ADL Screening (condition at time of admission) Patient's cognitive ability adequate to safely complete daily activities?: Yes Is the patient deaf or have difficulty hearing?: No Does the patient have difficulty seeing, even when wearing glasses/contacts?: No Does the patient have difficulty concentrating, remembering, or making decisions?: Yes Patient able to express need for assistance with ADLs?: Yes Does the patient have difficulty dressing or bathing?: No Independently performs ADLs?: Yes (appropriate for developmental age) Does the patient have difficulty walking or climbing stairs?: No Weakness of Legs: None Weakness of Arms/Hands: None  Permission Sought/Granted Permission sought to share information with : Case Manager Permission granted to share information with : Yes, Verbal Permission Granted  Share Information with NAME: Lavena Bullion  Permission granted to share info w AGENCY: [facility]  Permission granted to share info w Relationship: son     Emotional Assessment Appearance:: Appears stated age Attitude/Demeanor/Rapport: Engaged Affect (typically observed): Accepting Orientation: : Oriented to Self, Oriented to Place, Oriented to  Time, Oriented to Situation Alcohol / Substance Use: Not Applicable Psych Involvement: No (comment)  Admission diagnosis:  Hypoxia [R09.02] Demand ischemia [I24.89]  Acute respiratory failure with hypoxia (HCC) [J96.01] Aortic stenosis, severe [I35.0] Dyspnea, unspecified type [R06.00] Acute congestive heart failure, unspecified heart failure type [facility]) [I50.9] Patient Active Problem List   Diagnosis Date Noted   Acute respiratory failure with hypoxia (HCC)  08/14/2022   Acute kidney injury superimposed on chronic kidney disease (HCC) 08/14/2022   Elevated troponin 08/14/2022   Goals of care, counseling/discussion    Normal anion gap metabolic acidosis 05/01/2021   Hypothyroidism 05/01/2021   CHF (congestive heart failure) (HCC) 12/21/2020   Protein-calorie malnutrition, severe 12/21/2020   Dementia without behavioral disturbance (HCC) 12/20/2020   DNR (do not resuscitate) 12/20/2020   Fall    Left displaced femoral neck fracture (HCC) 03/22/2020   CKD (chronic kidney disease) 03/22/2020   CAD (coronary artery disease) 04/23/2013   Essential hypertension, benign 04/23/2013   Pure hypercholesterolemia 04/23/2013   Severe aortic stenosis    Spinal stenosis, lumbar 09/23/2011    Class: Diagnosis of   PCP:  Thana Ates, MD Pharmacy:   [facility] ORDER) ELECTRONIC [place], [place] - [address] [place] [address] Phone: [phone] Fax: [phone]  [id number] - [place], [name] - [address] [facility] [place] [address] Phone: [phone] Fax: [phone]     Social Determinants of Health (SDOH) Social History: SDOH Screenings   Tobacco Use: Low Risk  (08/13/2022)   SDOH Interventions:     Readmission Risk Interventions     No data to display

## 2022-08-15 NOTE — Progress Notes (Signed)
[name] to be D/C'd Home per MD order.  Discussed with the patient and all questions fully answered.  VSS, Skin clean, dry and intact without evidence of skin break down, no evidence of skin tears noted. IV catheter discontinued intact. Site without signs and symptoms of complications. Dressing and pressure applied.  An After Visit Summary was printed and given to the patient.   D/c education completed with patient/family including follow up instructions, medication list, d/c activities limitations if indicated, with other d/c instructions as indicated by MD - patient able to verbalize understanding, all questions fully answered.   Patient instructed to return to ED, call 911, or call MD for any changes in condition.   Patient escorted via WC, and D/C home via private auto.  [name] 08/15/2022 12:18 PM

## 2022-10-20 ENCOUNTER — Ambulatory Visit: Payer: Medicare HMO | Admitting: Internal Medicine

## 2023-03-31 ENCOUNTER — Other Ambulatory Visit: Payer: Self-pay | Admitting: Internal Medicine

## 2023-06-05 ENCOUNTER — Ambulatory Visit: Payer: Medicare HMO | Attending: Internal Medicine | Admitting: Internal Medicine

## 2023-06-05 VITALS — BP 146/74 | HR 53 | Ht 59.0 in | Wt 107.0 lb

## 2023-06-05 DIAGNOSIS — I5032 Chronic diastolic (congestive) heart failure: Secondary | ICD-10-CM

## 2023-06-05 DIAGNOSIS — I2510 Atherosclerotic heart disease of native coronary artery without angina pectoris: Secondary | ICD-10-CM | POA: Diagnosis not present

## 2023-06-05 DIAGNOSIS — I350 Nonrheumatic aortic (valve) stenosis: Secondary | ICD-10-CM | POA: Diagnosis not present

## 2023-06-05 DIAGNOSIS — Z951 Presence of aortocoronary bypass graft: Secondary | ICD-10-CM

## 2023-06-05 DIAGNOSIS — N183 Chronic kidney disease, stage 3 unspecified: Secondary | ICD-10-CM | POA: Diagnosis not present

## 2023-06-05 DIAGNOSIS — E785 Hyperlipidemia, unspecified: Secondary | ICD-10-CM

## 2023-06-05 NOTE — Patient Instructions (Signed)
 Medication Instructions:  Your physician recommends that you continue on your current medications as directed. Please refer to the Current Medication list given to you today.  *If you need a refill on your cardiac medications before your next appointment, please call your pharmacy*  Lab Work: NONE  If you have labs (blood work) drawn today and your tests are completely normal, you will receive your results only by: MyChart Message (if you have MyChart) OR A paper copy in the mail If you have any lab test that is abnormal or we need to change your treatment, we will call you to review the results.  Testing/Procedures: NONE  Follow-Up: At [facility], you and your health needs are our priority.  As part of our continuing mission to provide you with exceptional heart care, our providers are all part of one team.  This team includes your primary Cardiologist (physician) and Advanced Practice Providers or APPs (Physician Assistants and Nurse Practitioners) who all work together to provide you with the care you need, when you need it.  Your next appointment:   12 month(s)  Provider:   Gloriann Larger, MD  We recommend signing up for the patient portal called "MyChart".  Sign up information is provided on this After Visit Summary.  MyChart is used to connect with patients for Virtual Visits (Telemedicine).  Patients are able to view lab/test results, encounter notes, upcoming appointments, etc.  Non-urgent messages can be sent to your provider as well.   To learn more about what you can do with MyChart, go to [email or web address].

## 2023-06-05 NOTE — Progress Notes (Signed)
 Cardiology Office Note:  .    Date:  06/05/2023  ID:  Dennie Fitch, DOB [birth year], MRN [id number] PCP: Lucius Sabins., MD  [facility] Providers Cardiologist:  Janelle Mediate, MD     CC: Follow up AS and medical management  History of Present Illness: .    LUCINDA SPELLS is a 89 y.o. female with severe aortic stenosis and coronary artery disease who presents for follow-up regarding her cardiac condition. She is accompanied by her son.  She has a history of severe aortic stenosis and coronary artery disease, status post coronary artery bypass grafting (CABG). She was previously evaluated and found not to be a candidate for transcatheter aortic valve replacement (TAVR) due to her dementia. Her condition is managed with supportive measures and diuretic therapy, with a change in her diuretic dose in 2024. She experiences lower extremity edema and occasional shortness of breath, which are managed with low-dose Lasix . No chest pain or significant breathing issues are present at this time.  Her history of dementia without behavioral disturbances affects her memory and daily activities. Her son notes occasional forgetfulness about events or activities, such as whether she went gambling over the weekend. Despite memory issues, she feels good and enjoys spending time with her family.  Her current medications include a low-dose diuretic (Lasix ) and aspirin . The diuretic helps manage her fluid retention. She has a 'do not resuscitate' (DNR) order in place at home.  Discussed the use of AI scribe software for clinical note transcription with the patient, who gave verbal consent to proceed.   Relevant histories: .  Social  - comes with son - She enjoys gambling as a form of entertainment and plans to celebrate her 89th birthday with a party and a trip to a casino. - was formerly in hospice ROS: As per HPI.   Studies Reviewed: .     Cardiac Studies & Procedures    ______________________________________________________________________________________________     ECHOCARDIOGRAM  ECHOCARDIOGRAM COMPLETE 11/28/2019  Narrative ECHOCARDIOGRAM REPORT    Patient Name:   DARL BRISBIN Date of Exam: 11/28/2019 Medical Rec #:  [id number]        Height:       60.0 in Accession #:    [id number]       Weight:       90.0 lb Date of Birth:  [birth year]        BSA:          1.329 m Patient Age:    89 years         BP:           138/74 mmHg Patient Gender: F                HR:           82 bpm. Exam Location:  [address]  Procedure: 2D Echo, 3D Echo, Cardiac Doppler, Color Doppler and Strain Analysis  Indications:    I35 Aortic stenosis.  History:        Patient has prior history of Echocardiogram examinations, most recent 04/30/2019. CAD, Aortic Valve Disease; Risk Factors:Hypertension and Dyslipidemia.  Sonographer:    Henriette Lofty, RDCS Referring Phys: [id number] [address] Rule  IMPRESSIONS   1. The aortic valve is abnormal. There is severe calcifcation of the aortic valve. Aortic valve regurgitation is mild. Severe aortic valve stenosis. Aortic valve area, by VTI measures 0.40 cm. Aortic valve mean gradient measures 53.0 mmHg. Aortic valve Vmax measures 4.16  m/s. 2. Left ventricular ejection fraction, by estimation, is 55 to 60%. The left ventricle has normal function. The left ventricle has no regional wall motion abnormalities. Left ventricular diastolic parameters are consistent with Grade II diastolic dysfunction (pseudonormalization). Elevated left ventricular end-diastolic pressure. The average left ventricular global longitudinal strain is -20.1 %. The global longitudinal strain is normal. 3. Right ventricular systolic function is normal. The right ventricular size is normal. There is mildly elevated pulmonary artery systolic pressure. The estimated right ventricular systolic pressure is 38.7 mmHg. 4. Left atrial size was mildly dilated. 5.  Right atrial size was mildly dilated. 6. The mitral valve is grossly normal. Mild to moderate mitral valve regurgitation. No evidence of mitral stenosis. 7. The inferior vena cava is dilated in size with >50% respiratory variability, suggesting right atrial pressure of 8 mmHg.  Comparison(s): A prior study was performed on 04/30/19. Prior images reviewed side by side. Increase in systolic mean gradient through aortic valve, no other significant changes.  FINDINGS Left Ventricle: Left ventricular ejection fraction, by estimation, is 55 to 60%. The left ventricle has normal function. The left ventricle has no regional wall motion abnormalities. The average left ventricular global longitudinal strain is -20.1 %. The global longitudinal strain is normal. The left ventricular internal cavity size was normal in size. There is no left ventricular hypertrophy. Left ventricular diastolic parameters are consistent with Grade II diastolic dysfunction (pseudonormalization). Elevated left ventricular end-diastolic pressure.  Right Ventricle: The right ventricular size is normal. No increase in right ventricular wall thickness. Right ventricular systolic function is normal. There is mildly elevated pulmonary artery systolic pressure. The tricuspid regurgitant velocity is 2.77 m/s, and with an assumed right atrial pressure of 8 mmHg, the estimated right ventricular systolic pressure is 38.7 mmHg.  Left Atrium: Left atrial size was mildly dilated.  Right Atrium: Right atrial size was mildly dilated.  Pericardium: There is no evidence of pericardial effusion.  Mitral Valve: The mitral valve is grossly normal. Mild mitral annular calcification. Mild to moderate mitral valve regurgitation. No evidence of mitral valve stenosis.  Tricuspid Valve: The tricuspid valve is normal in structure. Tricuspid valve regurgitation is mild . No evidence of tricuspid stenosis.  Aortic Valve: The aortic valve is abnormal. There  is severe calcifcation of the aortic valve. Aortic valve regurgitation is mild. Aortic regurgitation PHT measures 547 msec. Severe aortic stenosis is present. Aortic valve mean gradient measures 53.0 mmHg. Aortic valve peak gradient measures 69.3 mmHg. Aortic valve area, by VTI measures 0.40 cm.  Pulmonic Valve: The pulmonic valve was normal in structure. Pulmonic valve regurgitation is trivial. No evidence of pulmonic stenosis.  Aorta: The aortic root and ascending aorta are structurally normal, with no evidence of dilitation.  Venous: The inferior vena cava is dilated in size with greater than 50% respiratory variability, suggesting right atrial pressure of 8 mmHg.  IAS/Shunts: No atrial level shunt detected by color flow Doppler.   LEFT VENTRICLE PLAX 2D LVIDd:         4.60 cm  Diastology LVIDs:         3.70 cm  LV e' medial:    4.73 cm/s LV PW:         0.70 cm  LV E/e' medial:  24.9 LV IVS:        0.60 cm  LV e' lateral:   3.26 cm/s LVOT diam:     2.00 cm  LV E/e' lateral: 36.2 LV SV:  43 LV SV Index:   33       2D Longitudinal Strain LVOT Area:     3.14 cm 2D Strain GLS (A2C):   -20.2 % 2D Strain GLS (A3C):   -19.5 % 2D Strain GLS (A4C):   -20.6 % 2D Strain GLS Avg:     -20.1 %  3D Volume EF: 3D EF:        47 % LV EDV:       135 ml LV ESV:       72 ml LV SV:        63 ml  RIGHT VENTRICLE RV Basal diam:  3.40 cm RV S prime:     7.60 cm/s TAPSE (M-mode): 2.4 cm RVSP:           38.7 mmHg  LEFT ATRIUM             Index       RIGHT ATRIUM           Index LA diam:        4.10 cm 3.08 cm/m  RA Pressure: 8.00 mmHg LA Vol (A2C):   52.3 ml 39.34 ml/m RA Area:     15.20 cm LA Vol (A4C):   40.3 ml 30.31 ml/m RA Volume:   38.70 ml  29.11 ml/m LA Biplane Vol: 50.9 ml 38.29 ml/m AORTIC VALVE AV Area (Vmax):    0.51 cm AV Area (Vmean):   0.46 cm AV Area (VTI):     0.40 cm AV Vmax:           416.33 cm/s AV Vmean:          301.200 cm/s AV VTI:            1.081  m AV Peak Grad:      69.3 mmHg AV Mean Grad:      53.0 mmHg LVOT Vmax:         68.02 cm/s LVOT Vmean:        44.080 cm/s LVOT VTI:          0.138 m LVOT/AV VTI ratio: 0.13 AI PHT:            547 msec  AORTA Ao Root diam: 3.10 cm Ao Asc diam:  3.30 cm  MITRAL VALVE                TRICUSPID VALVE TR Peak grad:   30.7 mmHg TR Vmax:        277.00 cm/s MV E velocity: 118.00 cm/s  Estimated RAP:  8.00 mmHg MV A velocity: 50.70 cm/s   RVSP:           38.7 mmHg MV E/A ratio:  2.33 SHUNTS Systemic VTI:  0.14 m Systemic Diam: 2.00 cm  Grady Lawman MD Electronically signed by Grady Lawman MD Signature Date/Time: 11/28/2019/9:34:17 PM    Final          ______________________________________________________________________________________________        Physical Exam:    VS:  BP (!) 146/74 (BP Location: Right Arm)   Pulse (!) 53   Ht 4\' 11"  (1.499 m)   Wt 107 lb (48.5 kg)   SpO2 98%   BMI 21.61 kg/m    Wt Readings from Last 3 Encounters:  06/05/23 107 lb (48.5 kg)  08/15/22 101 lb 6.6 oz (46 kg)  03/02/22 104 lb 9.6 oz (47.4 kg)    Gen: no distress , frail Neck: No JVD Cardiac: No Rubs or Gallops, Harsh  Systolic murmur, Regular bradycardia +2 radial pulses Respiratory: Clear to auscultation bilaterally, normal effort, normal  respiratory rate GI: Soft, nontender, non-distended  MS: bilateral non pitting  edema;  moves all extremities Integument: Skin feels warm Neuro:  At time of evaluation, alert and oriented to person and place; does not remember what she did this weekened Psych: Normal affect, patient feels ok   ASSESSMENT AND PLAN: .    An EKG was ordered for CAD and shows sinus bradycardia with ST depression  Severe aortic stenosis Severe aortic stenosis, was not a candidate for TAVR due to dementia, which increases procedural risks. No current symptoms of blockage issues. Imaging deferred as it is not in her best interest given her condition and  previous assessments. - Continue current management without reimaging. - Discuss potential increase in diuretics if dyspnea worsens. - Consider palliative care if unable to manage symptoms with medication for re-engagement of hospice  Coronary artery disease Coronary artery disease, asymptomatic. Aspirin  therapy continued as it is not causing any issues. - Continue aspirin  therapy. - given dementia, conservative prevention strategy  Lower extremity edema Chronic lower extremity edema, managed with diuretics. Balance of diuretic therapy discussed to avoid potential side effects such as dizziness. Adjust diuretics based on symptoms to manage fluid retention and avoid dizziness. - no change to diuretics today  Dementia without behavioral disturbance Dementia without behavioral disturbance, impacting candidacy for certain procedures. Memory issues contribute to increased risks during invasive procedures, influencing decision against TAVR.  Goals of Care Goals of care discussed, focusing on quality of life and symptom management rather than aggressive interventions. Emphasis on maintaining well-being and enjoying life activities. DNR status confirmed, aligning with her preference to avoid aggressive interventions. - Maintain current quality of life and symptom management approach. - Avoid unnecessary hospitalizations and interventions. - DNR status is respected.  Follow-up Follow-up plan discussed with emphasis on monitoring symptoms and adjusting treatment as needed. Follow-up in one year unless symptoms worsen, with a plan to adjust diuretics if dyspnea arises. - Schedule follow-up in one year unless symptoms worsen. - Contact if dyspnea arises to adjust diuretics. - Consider earlier follow-up if significant changes in condition occur.  Gloriann Larger, MD FASE [facility] Cardiologist [facility]  [address] [place], [address] [place], [place] [address] [phone]  9:22 AM

## 2023-08-17 ENCOUNTER — Other Ambulatory Visit: Payer: Self-pay | Admitting: Nephrology

## 2023-08-17 DIAGNOSIS — N184 Chronic kidney disease, stage 4 (severe): Secondary | ICD-10-CM
